# Patient Record
Sex: Female | Born: 1990 | Race: White | Hispanic: No | Marital: Single | Smoking: Never smoker
Health system: Southern US, Community
[De-identification: ages and names within clinical notes are randomized; demographics above are authoritative.]

## PROBLEM LIST (undated history)

## (undated) DIAGNOSIS — K219 Gastro-esophageal reflux disease without esophagitis: Secondary | ICD-10-CM

## (undated) DIAGNOSIS — F79 Unspecified intellectual disabilities: Secondary | ICD-10-CM

## (undated) DIAGNOSIS — M793 Panniculitis, unspecified: Secondary | ICD-10-CM

## (undated) DIAGNOSIS — Z973 Presence of spectacles and contact lenses: Secondary | ICD-10-CM

## (undated) DIAGNOSIS — E119 Type 2 diabetes mellitus without complications: Secondary | ICD-10-CM

## (undated) HISTORY — PX: ABDOMINAL HYSTERECTOMY: SHX81

---
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
## 2012-07-26 ENCOUNTER — Ambulatory Visit: Payer: Self-pay

## 2012-08-02 ENCOUNTER — Ambulatory Visit: Payer: Self-pay | Admitting: Internal Medicine

## 2012-12-25 ENCOUNTER — Ambulatory Visit: Payer: Self-pay

## 2013-05-13 ENCOUNTER — Ambulatory Visit: Payer: Self-pay | Admitting: Anesthesiology

## 2013-05-13 LAB — BASIC METABOLIC PANEL
Anion Gap: 4 — ABNORMAL LOW (ref 7–16)
BUN: 12 mg/dL (ref 7–18)
Calcium, Total: 9.5 mg/dL (ref 8.5–10.1)
Chloride: 106 mmol/L (ref 98–107)
Co2: 30 mmol/L (ref 21–32)
Creatinine: 0.67 mg/dL (ref 0.60–1.30)
Glucose: 93 mg/dL (ref 65–99)
Osmolality: 279 (ref 275–301)
POTASSIUM: 3.8 mmol/L (ref 3.5–5.1)
SODIUM: 140 mmol/L (ref 136–145)

## 2013-05-14 ENCOUNTER — Ambulatory Visit: Payer: Self-pay | Admitting: Unknown Physician Specialty

## 2013-12-02 DIAGNOSIS — Z6841 Body Mass Index (BMI) 40.0 and over, adult: Secondary | ICD-10-CM

## 2013-12-02 DIAGNOSIS — M1710 Unilateral primary osteoarthritis, unspecified knee: Secondary | ICD-10-CM | POA: Insufficient documentation

## 2014-07-26 NOTE — Op Note (Signed)
PATIENT NAME:  Whitney Brandt, Whitney Brandt MR#:  [id number] DATE OF BIRTH:  09/03/1990  DATE OF PROCEDURE:  05/14/2013  PREOPERATIVE DIAGNOSIS:  Chronic eustachian tube dysfunction and serous otitis.    POSTOPERATIVE DIAGNOSIS:  Chronic eustachian tube dysfunction and serous otitis.    OPERATION:  Bilateral myringotomy and butterfly tube placement.  SURGEON:  Linus Salmonshapman Kariann Wecker, MD  ANESTHESIA:  General mask.  OPERATIVE FINDINGS:  Bilateral serous fluid.   DESCRIPTION OF PROCEDURE:  [name] was identified in the holding area, taken to the operating room, and placed in the supine position.  After general mask anesthesia, the operating microscope was brought into the field.  Beginning with the right ear, the external canal was cleaned of cerumen. An anterior inferior myringotomy was performed.  There was serous fluid in the right middle ear space.  A butterfly tube was placed in the myringotomy.  Ciprodex drops were instilled in the external canal followed by a cotton ball.  In a similar fashion, a tube was placed in the opposite ear.  On the left, there was serous fluid.  The patient tolerated the procedure well, was awakened in the operating room, and taken to the recovery room in stable condition.  CULTURES:  None.  SPECIMENS:  None.  ESTIMATED BLOOD LOSS:  None.   ____________________________ Davina Pokehapman T. Abelino Tippin, MD ctm:dmm Brandt: 05/14/2013 09:27:22 ET T: 05/14/2013 09:45:55 ET JOB#: [id number]  cc: Davina Pokehapman T. Chanelle Hodsdon, MD, <Dictator> Davina PokeHAPMAN T Mio Schellinger MD ELECTRONICALLY SIGNED 05/28/2013 10:31

## 2015-02-09 ENCOUNTER — Emergency Department
Admission: EM | Admit: 2015-02-09 | Discharge: 2015-02-09 | Disposition: A | Discharge status: Home / Self Care | Payer: Medicaid Other | Attending: Emergency Medicine | Admitting: Emergency Medicine

## 2015-02-09 ENCOUNTER — Encounter: Payer: Self-pay | Admitting: Emergency Medicine

## 2015-02-09 ENCOUNTER — Emergency Department: Payer: Medicaid Other

## 2015-02-09 DIAGNOSIS — Z3202 Encounter for pregnancy test, result negative: Secondary | ICD-10-CM | POA: Insufficient documentation

## 2015-02-09 DIAGNOSIS — K760 Fatty (change of) liver, not elsewhere classified: Secondary | ICD-10-CM | POA: Diagnosis not present

## 2015-02-09 DIAGNOSIS — R1011 Right upper quadrant pain: Secondary | ICD-10-CM

## 2015-02-09 DIAGNOSIS — E119 Type 2 diabetes mellitus without complications: Secondary | ICD-10-CM | POA: Diagnosis not present

## 2015-02-09 HISTORY — DX: Type 2 diabetes mellitus without complications: E11.9

## 2015-02-09 LAB — CBC
HEMATOCRIT: 43.4 % (ref 35.0–47.0)
HEMOGLOBIN: 14.7 g/dL (ref 12.0–16.0)
MCH: 28.3 pg (ref 26.0–34.0)
MCHC: 34 g/dL (ref 32.0–36.0)
MCV: 83.2 fL (ref 80.0–100.0)
Platelets: 223 10*3/uL (ref 150–440)
RBC: 5.21 MIL/uL — AB (ref 3.80–5.20)
RDW: 13.4 % (ref 11.5–14.5)
WBC: 9.3 10*3/uL (ref 3.6–11.0)

## 2015-02-09 LAB — URINALYSIS COMPLETE WITH MICROSCOPIC (ARMC ONLY)
BACTERIA UA: NONE SEEN
Bilirubin Urine: NEGATIVE
Glucose, UA: NEGATIVE mg/dL
HGB URINE DIPSTICK: NEGATIVE
Ketones, ur: NEGATIVE mg/dL
Nitrite: NEGATIVE
PH: 5 (ref 5.0–8.0)
Protein, ur: NEGATIVE mg/dL
Specific Gravity, Urine: 1.014 (ref 1.005–1.030)

## 2015-02-09 LAB — COMPREHENSIVE METABOLIC PANEL
ALBUMIN: 3.6 g/dL (ref 3.5–5.0)
ALT: 53 U/L (ref 14–54)
AST: 37 U/L (ref 15–41)
Alkaline Phosphatase: 100 U/L (ref 38–126)
Anion gap: 9 (ref 5–15)
BILIRUBIN TOTAL: 0.3 mg/dL (ref 0.3–1.2)
BUN: 10 mg/dL (ref 6–20)
CHLORIDE: 104 mmol/L (ref 101–111)
CO2: 30 mmol/L (ref 22–32)
Calcium: 9.5 mg/dL (ref 8.9–10.3)
Creatinine, Ser: 0.66 mg/dL (ref 0.44–1.00)
GFR calc Af Amer: >60 mL/min (ref >60)
GFR calc non Af Amer: >60 mL/min (ref >60)
GLUCOSE: 114 mg/dL — AB (ref 65–99)
POTASSIUM: 4.1 mmol/L (ref 3.5–5.1)
SODIUM: 143 mmol/L (ref 135–145)
Total Protein: 7.6 g/dL (ref 6.5–8.1)

## 2015-02-09 LAB — POCT PREGNANCY, URINE: Preg Test, Ur: NEGATIVE

## 2015-02-09 LAB — LIPASE, BLOOD: Lipase: 21 U/L (ref 11–51)

## 2015-02-09 MED ORDER — FENTANYL CITRATE (PF) 100 MCG/2ML IJ SOLN
50.0000 ug | Freq: Once | INTRAMUSCULAR | Status: AC
  Administered 2015-02-09: 50 ug via INTRAMUSCULAR
  Filled 2015-02-09: qty 2

## 2015-02-09 NOTE — ED Provider Notes (Signed)
[facility] Emergency Department Provider Note  ____________________________________________  Time seen: Approximately 8:52 PM  I have reviewed the triage vital signs and the nursing notes.   HISTORY  Chief Complaint Abdominal Pain    HPI Whitney Brandt is a 24 y.o. female status post hysterectomy presenting with 4 days of right upper quadrant pain. Patient and her sister-in-law reports that over the last several days she has had right upper quadrant pain "all the time" that is "sharp."  It is worse with eating. She does not have any associated fever, chills, nausea or vomiting. No diarrhea or constipation. No abdominal distention.   Past Medical History  Diagnosis Date  . Diabetes mellitus without complication (HCC)     There are no active problems to display for this patient.   Past Surgical History  Procedure Laterality Date  . Abdominal hysterectomy      No current outpatient prescriptions on file.  Allergies Review of patient's allergies indicates no known allergies.  No family history on file.  Social History Social History  Substance Use Topics  . Smoking status: Never Smoker   . Smokeless tobacco: None  . Alcohol Use: None    Review of Systems Constitutional: No fever/chills. No lightheadedness or syncope. Eyes: No visual changes. ENT: No sore throat. Cardiovascular: Denies chest pain, palpitations. Respiratory: Denies shortness of breath.  No cough. Gastrointestinal: Positive right upper quadrant abdominal pain.  No nausea, no vomiting.  No diarrhea.  No constipation. Genitourinary: Negative for dysuria. Musculoskeletal: Negative for back pain. Skin: Negative for rash. Neurological: Negative for headaches, focal weakness or numbness.  10-point ROS otherwise negative.  ____________________________________________   PHYSICAL EXAM:  VITAL SIGNS: ED Triage Vitals  Enc Vitals Group     BP 02/09/15 1854 154/88 mmHg      Pulse Rate 02/09/15 1854 63     Resp 02/09/15 1854 20     Temp 02/09/15 1854 98.2 F (36.8 C)     Temp Source 02/09/15 1854 Oral     SpO2 02/09/15 1859 97 %     Weight 02/09/15 1854 302 lb (136.986 kg)     Height 02/09/15 1854 5\' 7"  (1.702 m)     Head Cir --      Peak Flow --      Pain Score --      Pain Loc --      Pain Edu? --      Excl. in GC? --     Constitutional: Alert and oriented. Well appearing and in no acute distress. Answer question appropriately. Able to move about in the stretcher and the room without significant pain. Eyes: Conjunctivae are normal.  EOMI. Head: Atraumatic. Nose: No congestion/rhinnorhea. Mouth/Throat: Mucous membranes are moist.  Neck: No stridor.  Supple.   Cardiovascular: Normal rate, regular rhythm. No murmurs, rubs or gallops.  Respiratory: Normal respiratory effort.  No retractions. Lungs CTAB.  No wheezes, rales or ronchi. Gastrointestinal: Morbidly obese. Abdomen is soft, nondistended. She has minimal pain in the right upper quadrant and a negative Murphy sign. No peritoneal signs, no guarding or rebound. Musculoskeletal: No LE edema.  Neurologic:  Normal speech and language. No gross focal neurologic deficits are appreciated.  Skin:  Skin is warm, dry and intact. No rash noted. Psychiatric: Mood and affect are normal. Speech and behavior are normal.  Normal judgement.  ____________________________________________   LABS (all labs ordered are listed, but only abnormal results are displayed)  Labs Reviewed  CBC - Abnormal; Notable  for the following:    RBC 5.21 (*)    All other components within normal limits  COMPREHENSIVE METABOLIC PANEL - Abnormal; Notable for the following:    Glucose, Bld 114 (*)    All other components within normal limits  URINALYSIS COMPLETEWITH MICROSCOPIC ([facility] ONLY) - Abnormal; Notable for the following:    Color, Urine YELLOW (*)    APPearance CLEAR (*)    Leukocytes, UA 2+ (*)    Squamous Epithelial  / LPF 0-5 (*)    All other components within normal limits  LIPASE, BLOOD  POC URINE PREG, ED  POCT PREGNANCY, URINE   ____________________________________________  EKG  Not indicated ____________________________________________  RADIOLOGY  US Abdomen Limited Ruq  02/09/2015  CLINICAL DATA:  Right upper quadrant abdominal pain EXAM: US ABDOMEN LIMITED - RIGHT UPPER QUADRANT COMPARISON:  08/02/2012 FINDINGS: Sensitivity diminished by patient body habitus. Gallbladder: No gallstones or wall thickening visualized. No sonographic Murphy sign noted. Common bile duct: Diameter: 3 mm Liver: Echogenic liver with poor acoustic transmission and portal triad visualization. Appearance is similar to prior. No focal lesion is seen. Antegrade flow in the imaged portal venous system. IMPRESSION: 1. Negative gallbladder. 2. Hepatic steatosis. Electronically Signed   By: Marnee Spring M.D.   On: 02/09/2015 21:36    ____________________________________________   PROCEDURES  Procedure(s) performed: None  Critical Care performed: No ____________________________________________   INITIAL IMPRESSION / ASSESSMENT AND PLAN / ED COURSE  Pertinent labs & imaging results that were available during my care of the patient were reviewed by me and considered in my medical decision making (see chart for details).  23 y.o. female with several days of right upper quadrant pain without any other associated symptoms. On my exam, she is well-appearing and nontoxic. Her vital signs are stable. Her labs are reassuring. She does not have any signs or symptoms that would be consistent with an acute cardiac disease or pneumonia. I will get an ultrasound to evaluate her gallbladder.  Patient ultrasound did not show any gallbladder disease. The patient remained stable and will be discharged with close PMD follow-up. I did discuss return precautions and follow-up instructions with the patient and her sister-in-law who  brought her here.  ____________________________________________  FINAL CLINICAL IMPRESSION(S) / ED DIAGNOSES  Final diagnoses:  Right upper quadrant pain  Hepatic steatosis      NEW MEDICATIONS STARTED DURING THIS VISIT:  There are no discharge medications for this patient.    Rockne Menghini, MD 02/09/15 2221

## 2015-02-09 NOTE — ED Notes (Signed)
Pt states that she has had pain in R side for about a week.  States that pain is constant.  States she noticed a lump on R side/beneath R breast area.  Bump is present at this time.  Pt has had no nausea/vomiting/changes in bowel movements.  Pt in NAD at this time.  Mother present at bedside.

## 2015-02-09 NOTE — ED Notes (Signed)
Right upper quadrant pain x 1 week.  Seen by PCP who felt a "Knot" and told that patient needed RUQ US.  No appointments available until December.  Mom reports patient sits and cries at home from the pain.

## 2015-02-09 NOTE — Discharge Instructions (Signed)
You may take Tylenol for your pain. Please make an appointment with your primary care physician for follow-up about your abdominal pain. Next  Please return to the emergency department if you develop severe pain, fever, inability to keep down fluids, or any other symptoms concerning to you.  Abdominal Pain, Adult Many things can cause abdominal pain. Usually, abdominal pain is not caused by a disease and will improve without treatment. It can often be observed and treated at home. Your health care provider will do a physical exam and possibly order blood tests and X-rays to help determine the seriousness of your pain. However, in many cases, more time must pass before a clear cause of the pain can be found. Before that point, your health care provider may not know if you need more testing or further treatment. HOME CARE INSTRUCTIONS Monitor your abdominal pain for any changes. The following actions may help to alleviate any discomfort you are experiencing:  Only take over-the-counter or prescription medicines as directed by your health care provider.  Do not take laxatives unless directed to do so by your health care provider.  Try a clear liquid diet (broth, tea, or water) as directed by your health care provider. Slowly move to a bland diet as tolerated. SEEK MEDICAL CARE IF:  You have unexplained abdominal pain.  You have abdominal pain associated with nausea or diarrhea.  You have pain when you urinate or have a bowel movement.  You experience abdominal pain that wakes you in the night.  You have abdominal pain that is worsened or improved by eating food.  You have abdominal pain that is worsened with eating fatty foods.  You have a fever. SEEK IMMEDIATE MEDICAL CARE IF:  Your pain does not go away within 2 hours.  You keep throwing up (vomiting).  Your pain is felt only in portions of the abdomen, such as the right side or the left lower portion of the abdomen.  You pass  bloody or black tarry stools. MAKE SURE YOU:  Understand these instructions.  Will watch your condition.  Will get help right away if you are not doing well or get worse.   This information is not intended to replace advice given to you by your health care provider. Make sure you discuss any questions you have with your health care provider.   Document Released: 12/29/2004 Document Revised: 12/10/2014 Document Reviewed: 11/28/2012 Elsevier Interactive Patient Education [employer]2016 Elsevier Inc.

## 2015-02-09 NOTE — ED Notes (Signed)
Patient discharged to home per MD order. Patient in stable condition, and deemed medically cleared by ED provider for discharge. Discharge instructions reviewed with patient/family using "Teach Back"; verbalized understanding of medication education and administration, and information about follow-up care. Denies further concerns. ° °

## 2015-08-12 ENCOUNTER — Other Ambulatory Visit: Payer: Self-pay | Admitting: Nurse Practitioner

## 2015-08-12 DIAGNOSIS — M25572 Pain in left ankle and joints of left foot: Secondary | ICD-10-CM

## 2015-08-12 DIAGNOSIS — M25562 Pain in left knee: Secondary | ICD-10-CM

## 2015-08-12 DIAGNOSIS — Z9181 History of falling: Secondary | ICD-10-CM

## 2017-03-18 IMAGING — US US ABDOMEN LIMITED
1 series · 14 of 25 positions shown · non-contrast
Comparison: 08/02/2012

CLINICAL DATA: Right upper quadrant abdominal pain

EXAM:
US ABDOMEN LIMITED - RIGHT UPPER QUADRANT

[Series 1: us abdomen limited · 0.23mm/px · 14 of 39 slices shown]
[im 1/39]
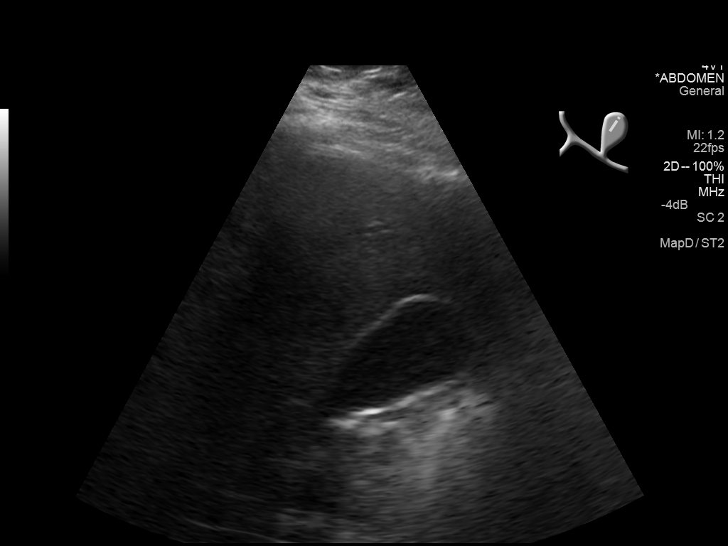
[im 4/39]
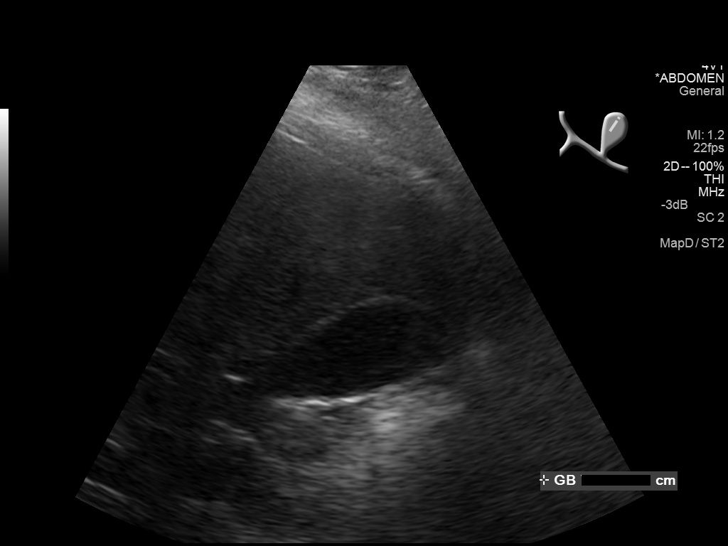
[im 7/39]
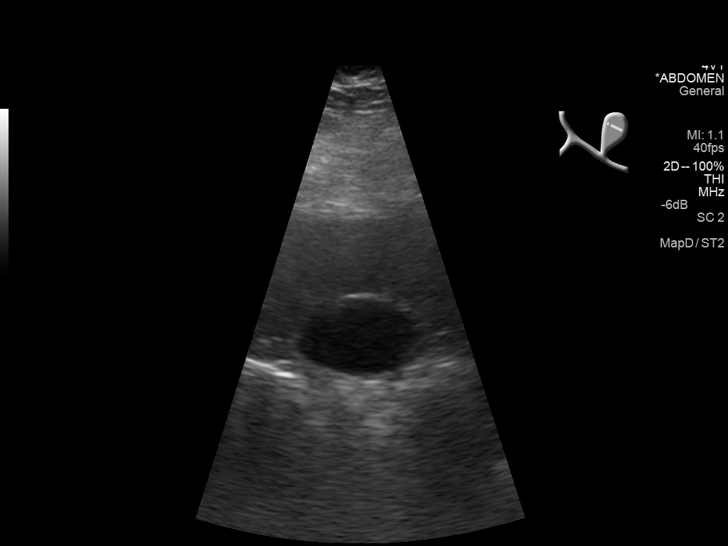
[im 10/39]
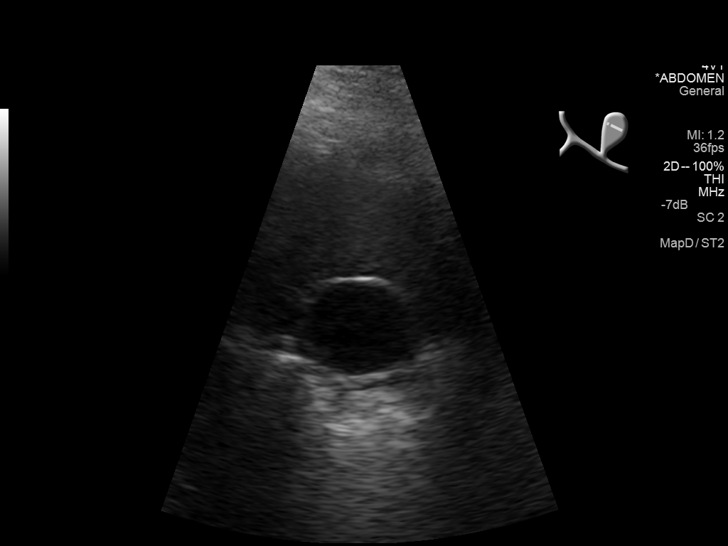
[im 13/39]
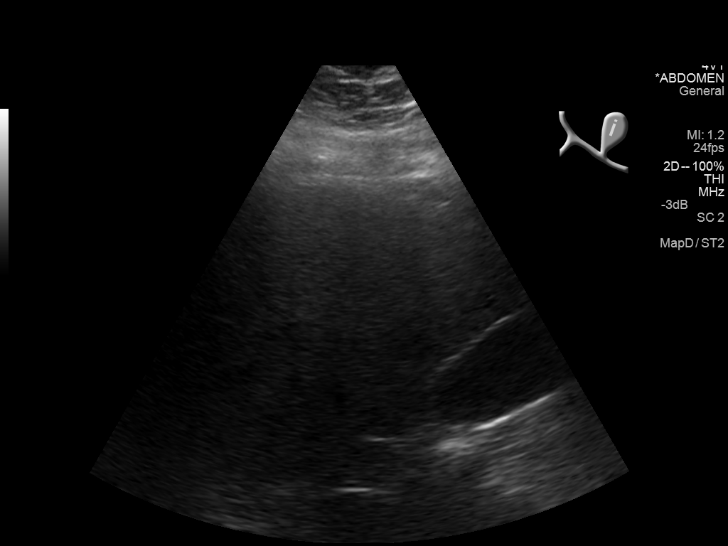
[im 15/39]
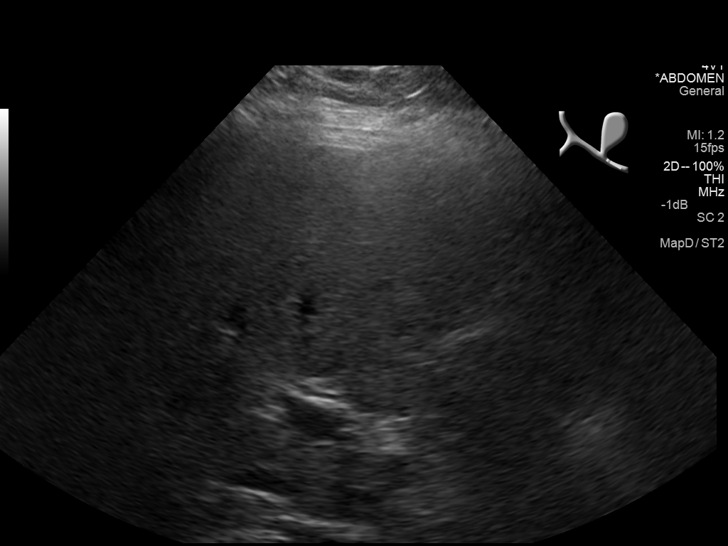
[im 18/39]
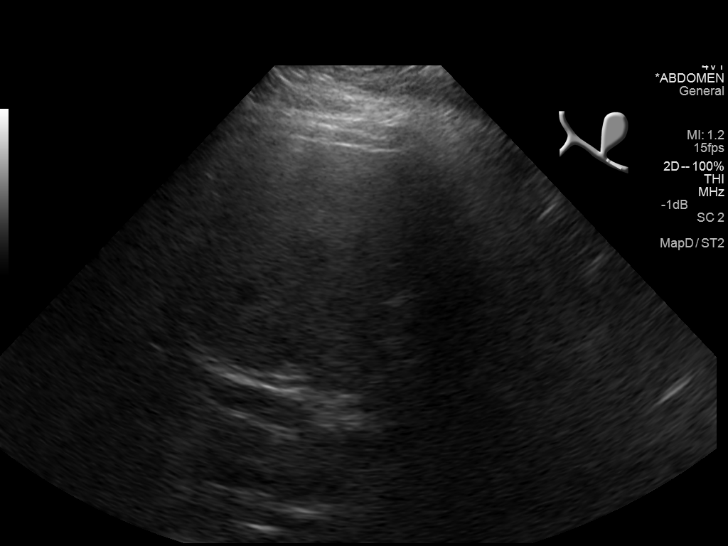
[im 21/39]
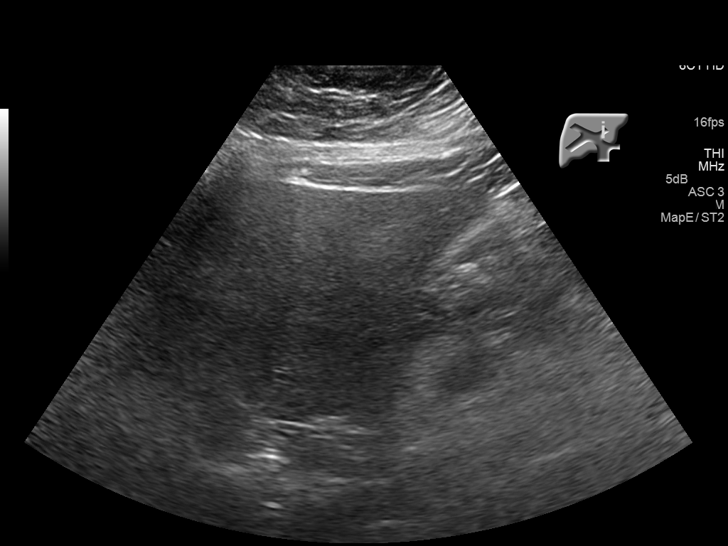
[im 24/39]
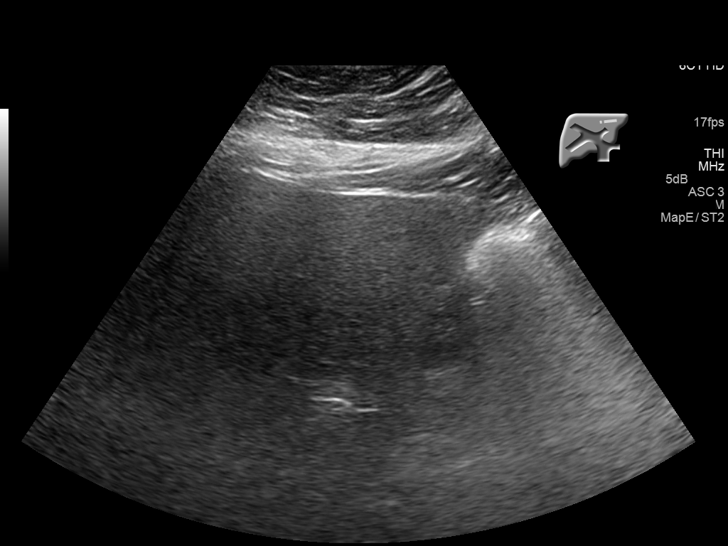
[im 26/39]
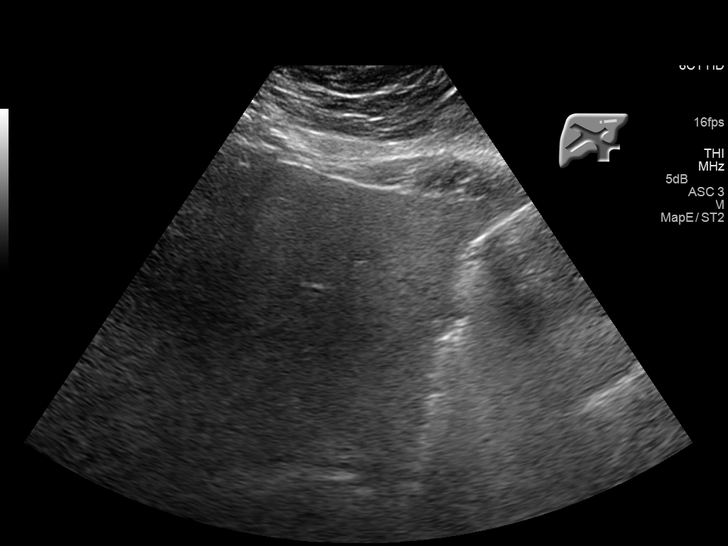
[im 29/39]
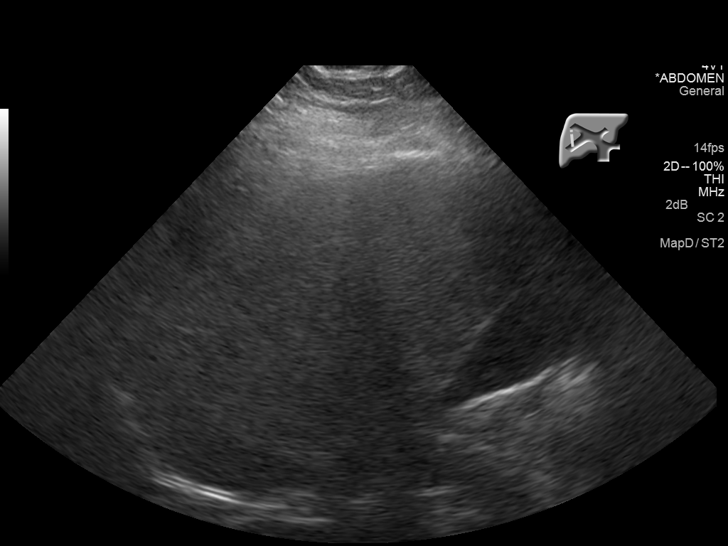
[im 32/39]
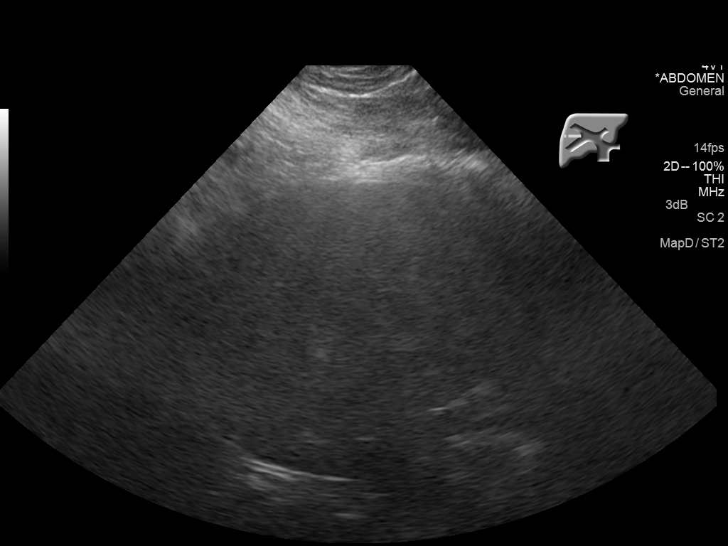
[im 35/39]
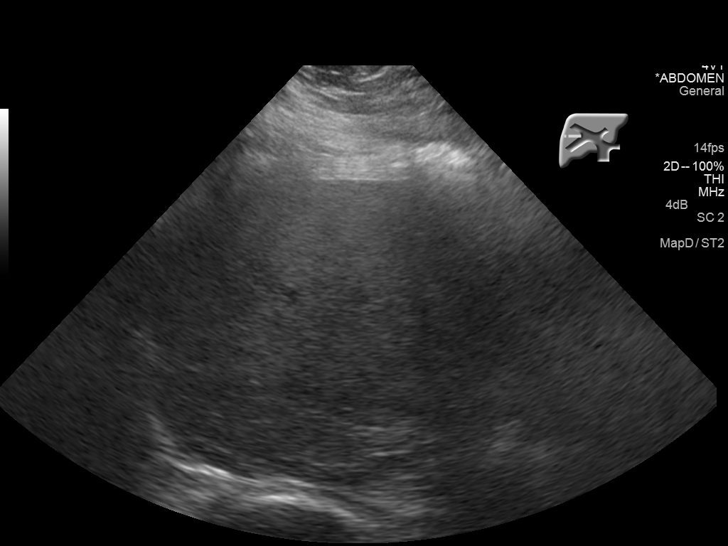
[im 39/39]
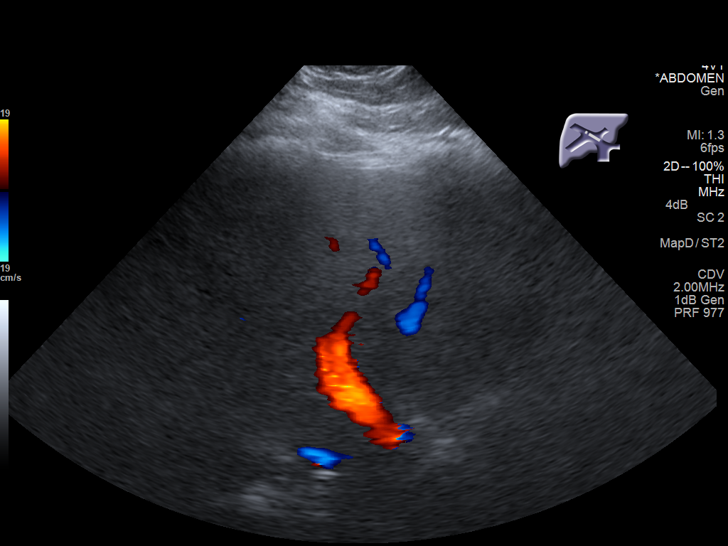

[14 of 25 positions shown; findings below may reference images not displayed]

FINDINGS: Sensitivity diminished by patient body habitus.

Gallbladder:

No gallstones or wall thickening visualized. No sonographic Murphy
sign noted.

Common bile duct:

Diameter: 3 mm

Liver:

Echogenic liver with poor acoustic transmission and portal [REDACTED]
visualization. Appearance is similar to prior. No focal lesion is
seen. Antegrade flow in the imaged portal venous system.
IMPRESSION: 1. Negative gallbladder.
2. Hepatic steatosis.

## 2017-03-22 ENCOUNTER — Encounter: Payer: Self-pay | Admitting: Nurse Practitioner

## 2017-03-22 ENCOUNTER — Ambulatory Visit: Payer: Medicaid Other | Admitting: Nurse Practitioner

## 2017-03-22 DIAGNOSIS — E010 Iodine-deficiency related diffuse (endemic) goiter: Secondary | ICD-10-CM

## 2017-03-22 DIAGNOSIS — M1710 Unilateral primary osteoarthritis, unspecified knee: Secondary | ICD-10-CM | POA: Diagnosis not present

## 2017-03-22 DIAGNOSIS — L709 Acne, unspecified: Secondary | ICD-10-CM | POA: Insufficient documentation

## 2017-03-22 DIAGNOSIS — J309 Allergic rhinitis, unspecified: Secondary | ICD-10-CM | POA: Diagnosis not present

## 2017-03-22 DIAGNOSIS — R7301 Impaired fasting glucose: Secondary | ICD-10-CM

## 2017-03-22 DIAGNOSIS — Z23 Encounter for immunization: Secondary | ICD-10-CM | POA: Diagnosis not present

## 2017-03-22 DIAGNOSIS — M545 Low back pain, unspecified: Secondary | ICD-10-CM | POA: Insufficient documentation

## 2017-03-22 DIAGNOSIS — J029 Acute pharyngitis, unspecified: Secondary | ICD-10-CM | POA: Insufficient documentation

## 2017-03-22 MED ORDER — FLUTICASONE PROPIONATE 50 MCG/ACT NA SUSP: 2.0000 | Freq: Every day | NASAL | 6 refills | Status: DC

## 2017-03-22 MED ORDER — ACETAMINOPHEN-CODEINE 300-30 MG PO TABS: 1.0000 | ORAL_TABLET | Freq: Every evening | ORAL | 1 refills | Status: DC | PRN

## 2017-03-22 MED ORDER — PHENTERMINE HCL 37.5 MG PO TABS: 37.5000 mg | ORAL_TABLET | Freq: Every day | ORAL | 1 refills | Status: DC

## 2017-03-22 NOTE — Progress Notes (Signed)
Subjective:     Patient ID: Whitney Brandt, female   DOB: 07/12/1990, 26 y.o.   MRN: [id number]  Current Meds  Medication Sig  . chlorhexidine (HIBICLENS) 4 % external liquid   . nystatin cream (MYCOSTATIN)    The patient c/o difficulty swallowing. Feels like food and medications are getting stuck in her throat. This is intermittent.  [name] also here as follolw up for weight management. Takes phentermine to help suppress the appetite. She has lost 16 pounds since she was last seen. She has lost nearly 100 pounds since starting medically managed program for weight loss.      Review of Systems  Constitutional: Negative for activity change and appetite change.       Weight loss of 16 pounds since most recent visit .  HENT: Positive for ear pain and trouble swallowing.        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 States they were looking good but wants to see her back I n6 months.   Eyes: Negative.   Respiratory: Negative.   Cardiovascular: Negative for palpitations.  Gastrointestinal: Negative.   Endocrine:       Difficulty swallowing.   Genitourinary: Negative.   Musculoskeletal:       Intermittent knee pain.  Neurological: Negative.   Hematological: Negative.   Psychiatric/Behavioral: Negative.        Today's Vitals   03/22/17 1419  BP: 139/82  Pulse: 89  Resp: 16  SpO2: 99%  Weight: 232 lb 9.6 oz (105.5 kg)  Height: 5\' 6"  (1.676 m)   Objective:   Physical Exam  Constitutional: [name] oriented to person, place, and time. She appears well-developed and well-nourished.  HENT:  Head: Normocephalic and atraumatic.  Neck: Normal range of motion. Neck supple. Thyromegaly present.  Cardiovascular: Normal rate and regular rhythm.  Pulmonary/Chest: Effort normal and breath sounds normal.  Abdominal: Soft. There is no tenderness.  Musculoskeletal:       Right knee: She exhibits decreased range of motion and swelling. Tenderness found.    Left knee: She exhibits decreased range of motion and swelling. Tenderness found.  Neurological: [name] alert and oriented to person, place, and time.  Skin: Skin is warm and dry.  Psychiatric: She has a normal mood and affect.       Assessment:     Morbid obesity (HCC) - Plan: Comprehensive Metabolic Panel (CMET), Lipid Profile, phentermine (ADIPEX-P) 37.5 MG tablet  Thyromegaly - Plan: CBC w/Diff/Platelet, TSH, T4, free, US Soft Tissue Head/Neck  Arthrosis of knee - Plan: Acetaminophen-Codeine (TYLENOL/CODEINE #3) 300-30 MG tablet  Flu vaccine need - Plan: Flu Vaccine QUAD 36+ mos IM, CANCELED: Flu Vaccine QUAD 36+ mos IM  Impaired fasting glucose - Plan: HgB A1c  Allergic rhinitis, unspecified seasonality, unspecified trigger - Plan: fluticasone (FLONASE) 50 MCG/ACT nasal spray     Plan:     1. Obesity - continues to do very well. Additional 16 pounds lost since most recent visit. Continue phentermine 37.5mg  daily. Limit calorie intake to 1500 calories per day and participate in regular exercise 2. Thyromegaly - labs with TSH and Free t4. U/s thyroid prdered 3. Arthrosis of knee - renew tylenol/APAP 30/300mg  which may be taken at night if needed for oderate pain.  4. Flu vaccine administerd 5. Check HgbA1c  6. Allergic rhinitis - continue allergy meds and flonase nasal spray daily.   Follow up 2 months and sooner if needed

## 2017-03-22 NOTE — Patient Instructions (Addendum)
What You Need to Know About Osteoarthritis Osteoarthritis is a type of arthritis that affects tissue that covers the ends of bones in joints (cartilage). Cartilage acts as a cushion between the bones and helps them move smoothly. Osteoarthritis results when cartilage in the joints gets worn down. Osteoarthritis is sometimes called "wear and tear" arthritis. Osteoarthritis can affect any joint and can make movement painful. Hips, knees, fingers, and toes are some of the joints that are most often affected by osteoarthritis. You may be more likely to develop osteoarthritis if:  You are middle-aged or older.  You are obese.  You have injured a joint or had surgery on a joint.  You have a family history of osteoarthritis.  How can osteoarthritis affect me? Osteoarthritis can cause:  Pain and swelling in your joint.  Difficulty moving your joint.  A grating or scraping feeling inside the joint when you move it.  Popping or creaking sounds when you move.  This condition can make it harder to do things that you need or want to do each day. Osteoarthritis in a major joint, such as your knee or hip, can make it painful to walk or exercise. If you have osteoarthritis in your hands, you might not be able to grip items, twist your hand, or control small movements of your hands and fingers (fine motor skills). Over time, osteoarthritis could cause you to be less physically active. Being less active increases your risk for other long-term (chronic) health problems, such as diabetes and heart disease. What lifestyle changes can be made? You can lessen the impact that osteoarthritis has on your daily life by:  Switching to low-impact activities that do not put repeated pressure on your joints. For example, if you usually run or jog for exercise, try swimming or riding a bike instead.  Staying active. Build up to at least 150 minutes of physical activity each week to keep your joints healthy and keep  your body strong.  Losing weight. If you are overweight or obese, losing weight can take pressure off of your joints. If you need help with weight loss, talk with your health care provider or a diet and nutrition specialist (dietitian).  What other changes can be made? You can also lessen the effect of osteoarthritis by:  Using assistive devices. Sometimes a brace, wrap, splint, specialized glove, or cane can help. Talk with your health care provider or physical therapist about when and how to use these.  Working with a physical therapist who can help you find ways to do your daily activities without harming your joints. A physical therapist can also teach you exercises and stretches to strengthen the muscles that support your joints.  Treating pain and inflammation. Take over-the-counter and prescription medicines for pain and inflammation only as told by your health care provider. If directed, you may put ice on the affected joint: ? If you have a removable assistive device, remove it as told by your health care provider. ? Put ice in a plastic bag. ? Place a towel between your skin and the bag. ? Leave the ice on for 20 minutes, 2-3 times a day.  If other measures do not work, you may need joint surgery, such as joint replacement. What can happen if changes are not made? Osteoarthritis is a condition that gets worse over time (a progressive condition). If you do not take steps to strengthen your body and to slow down the progress of the disease, your condition may get worse more   quickly. Your joints may stiffen and become swollen, which will make them painful and hard to move. Where to find more information: You can learn more about osteoarthritis from:  The Arthritis Foundation: www.arthritis.org/about-arthritis/types/osteoarthritis  National Institute of Arthritis and Musculoskeletal and Skin Diseases: www.niams.nih.gov/health_info/osteoarthritis/osteoarthritis_ff.asp  Contact a  health care provider if:  You cannot do your normal activities comfortably.  Your joint does not function at all.  Your pain is interfering with your sleep.  You are gaining weight.  Your joint appears to be changing in shape, instead of just being swollen and sore. Summary  Osteoarthritis is a painful joint disease that gets worse over time.  This condition can lead to other long-term (chronic) health problems.  There are changes that you can make to slow down the progression of the disease. This information is not intended to replace advice given to you by your health care provider. Make sure you discuss any questions you have with your health care provider. Document Released: 11/10/2015 Document Revised: 11/12/2015 Document Reviewed: 11/10/2015 Elsevier Interactive Patient Education  2018 Elsevier Inc.  

## 2017-03-24 LAB — COMPREHENSIVE METABOLIC PANEL
ALT: 36 IU/L — AB (ref 0–32)
AST: 17 IU/L (ref 0–40)
Albumin/Globulin Ratio: 2 (ref 1.2–2.2)
Albumin: 4.5 g/dL (ref 3.5–5.5)
Alkaline Phosphatase: 91 IU/L (ref 39–117)
BUN/Creatinine Ratio: 15 (ref 9–23)
BUN: 10 mg/dL (ref 6–20)
Bilirubin Total: 0.5 mg/dL (ref 0.0–1.2)
CALCIUM: 9.7 mg/dL (ref 8.7–10.2)
CO2: 27 mmol/L (ref 20–29)
CREATININE: 0.65 mg/dL (ref 0.57–1.00)
Chloride: 104 mmol/L (ref 96–106)
GFR, EST AFRICAN AMERICAN: 141 mL/min/{1.73_m2} (ref >59)
GFR, EST NON AFRICAN AMERICAN: 122 mL/min/{1.73_m2} (ref >59)
Globulin, Total: 2.3 g/dL (ref 1.5–4.5)
Glucose: 68 mg/dL (ref 65–99)
Potassium: 3.9 mmol/L (ref 3.5–5.2)
Sodium: 144 mmol/L (ref 134–144)
TOTAL PROTEIN: 6.8 g/dL (ref 6.0–8.5)

## 2017-03-24 LAB — HEMOGLOBIN A1C
ESTIMATED AVERAGE GLUCOSE: 103 mg/dL
HEMOGLOBIN A1C: 5.2 % (ref 4.8–5.6)

## 2017-03-24 LAB — CBC WITH DIFFERENTIAL/PLATELET
BASOS: 0 %
Basophils Absolute: 0 10*3/uL (ref 0.0–0.2)
EOS (ABSOLUTE): 0.2 10*3/uL (ref 0.0–0.4)
EOS: 3 %
HEMATOCRIT: 41.5 % (ref 34.0–46.6)
Hemoglobin: 14 g/dL (ref 11.1–15.9)
IMMATURE GRANS (ABS): 0 10*3/uL (ref 0.0–0.1)
IMMATURE GRANULOCYTES: 0 %
LYMPHS: 33 %
Lymphocytes Absolute: 2.1 10*3/uL (ref 0.7–3.1)
MCH: 28.3 pg (ref 26.6–33.0)
MCHC: 33.7 g/dL (ref 31.5–35.7)
MCV: 84 fL (ref 79–97)
Monocytes Absolute: 0.3 10*3/uL (ref 0.1–0.9)
Monocytes: 5 %
NEUTROS PCT: 59 %
Neutrophils Absolute: 3.7 10*3/uL (ref 1.4–7.0)
Platelets: 216 10*3/uL (ref 150–379)
RBC: 4.94 x10E6/uL (ref 3.77–5.28)
RDW: 13.4 % (ref 12.3–15.4)
WBC: 6.3 10*3/uL (ref 3.4–10.8)

## 2017-03-24 LAB — TSH: TSH: 0.38 u[IU]/mL — ABNORMAL LOW (ref 0.450–4.500)

## 2017-03-24 LAB — LIPID PANEL
CHOLESTEROL TOTAL: 129 mg/dL (ref 100–199)
Chol/HDL Ratio: 3.8 ratio (ref 0.0–4.4)
HDL: 34 mg/dL — AB (ref >39)
LDL Calculated: 74 mg/dL (ref 0–99)
TRIGLYCERIDES: 106 mg/dL (ref 0–149)
VLDL Cholesterol Cal: 21 mg/dL (ref 5–40)

## 2017-03-24 LAB — T4, FREE: Free T4: 1.27 ng/dL (ref 0.82–1.77)

## 2017-03-24 NOTE — Progress Notes (Signed)
Hey. Can you let the patient know that her labs looked great. Thanks

## 2017-04-17 ENCOUNTER — Other Ambulatory Visit: Payer: Self-pay | Admitting: Nurse Practitioner

## 2017-04-26 ENCOUNTER — Other Ambulatory Visit: Payer: Self-pay

## 2017-04-26 MED ORDER — IBUPROFEN 800 MG PO TABS: ORAL_TABLET | ORAL | 3 refills | Status: DC

## 2017-05-01 ENCOUNTER — Ambulatory Visit: Payer: Medicaid Other

## 2017-05-01 DIAGNOSIS — E049 Nontoxic goiter, unspecified: Secondary | ICD-10-CM

## 2017-05-01 DIAGNOSIS — E010 Iodine-deficiency related diffuse (endemic) goiter: Secondary | ICD-10-CM

## 2017-05-11 ENCOUNTER — Telehealth: Payer: Self-pay

## 2017-05-11 NOTE — Telephone Encounter (Signed)
Pt advised for ultrasound and send tat reminder for referral/np

## 2017-05-23 ENCOUNTER — Ambulatory Visit: Payer: Self-pay | Admitting: Nurse Practitioner

## 2017-05-29 ENCOUNTER — Other Ambulatory Visit: Payer: Self-pay | Admitting: Nurse Practitioner

## 2017-05-29 MED ORDER — PHENTERMINE HCL 37.5 MG PO TABS: 37.5000 mg | ORAL_TABLET | Freq: Every day | ORAL | 1 refills | Status: DC

## 2017-05-29 NOTE — Progress Notes (Signed)
Refilled patient's phentermine and sent to [facility]

## 2017-05-30 ENCOUNTER — Telehealth: Payer: Self-pay

## 2017-05-30 ENCOUNTER — Other Ambulatory Visit: Payer: Self-pay | Admitting: Unknown Physician Specialty

## 2017-05-30 DIAGNOSIS — E041 Nontoxic single thyroid nodule: Secondary | ICD-10-CM

## 2017-05-30 NOTE — Telephone Encounter (Signed)
-----   Message from Carlean JewsHeather E Boscia, NP sent at 05/29/2017  5:51 PM EST ----- Refilled patient's phentermine and sent to [facility]   ----- Message ----- From: Golda AcrePatel, Arjay Jaskiewicz Sent: 05/29/2017   5:01 PM To: Carlean JewsHeather E Boscia, NP  Pt left the message that you told her that she can call for her wt loss med

## 2017-05-30 NOTE — Telephone Encounter (Signed)
Pt advised send refill to [facility]

## 2017-05-30 NOTE — Telephone Encounter (Signed)
-----   Message from Heather E Boscia, NP sent at 05/29/2017  5:51 PM EST ----- Refilled patient's phentermine and sent to [facility]   ----- Message ----- From: Dayden Viverette Sent: 05/29/2017   5:01 PM To: Heather E Boscia, NP  Pt left the message that you told her that she can call for her wt loss med   

## 2017-05-31 ENCOUNTER — Other Ambulatory Visit: Payer: Self-pay | Admitting: Unknown Physician Specialty

## 2017-05-31 DIAGNOSIS — R1312 Dysphagia, oropharyngeal phase: Secondary | ICD-10-CM

## 2017-06-19 ENCOUNTER — Encounter: Payer: Self-pay | Admitting: Nurse Practitioner

## 2017-06-19 ENCOUNTER — Ambulatory Visit: Payer: Medicaid Other | Admitting: Nurse Practitioner

## 2017-06-19 DIAGNOSIS — M1710 Unilateral primary osteoarthritis, unspecified knee: Secondary | ICD-10-CM

## 2017-06-19 MED ORDER — ACETAMINOPHEN-CODEINE 300-30 MG PO TABS: 1.0000 | ORAL_TABLET | Freq: Every evening | ORAL | 1 refills | Status: DC | PRN

## 2017-06-19 MED ORDER — PHENTERMINE HCL 37.5 MG PO TABS: 37.5000 mg | ORAL_TABLET | Freq: Every day | ORAL | 1 refills | Status: DC

## 2017-06-19 NOTE — Progress Notes (Signed)
[facility] [address] [place], [place] [address]  Internal MEDICINE  Office Visit Note  Patient Name: Whitney Brandt  [id number]  [id number]  Date of Service: 07/09/2017  Chief Complaint  Patient presents with  . Obesity    weight loss management    The patient is here for routine follow up exam. [name] currently taking phentermine daily. [name] carefully portioning her food and exercising often. Going back to 07/2015, she has lost 101 pounds. [name] doing well without negative concerns or complaints.     [name] for routine follow up.    Current Medication: Outpatient Encounter Medications as of 06/19/2017  Medication Sig  . Acetaminophen-Codeine (TYLENOL/CODEINE #3) 300-30 MG tablet Take 1-2 tablets by mouth at bedtime as needed for pain.  . chlorhexidine (HIBICLENS) 4 % external liquid   . fluticasone (FLONASE) 50 MCG/ACT nasal spray Place 2 sprays into both nostrils daily.  Marland Kitchen ibuprofen (ADVIL,MOTRIN) 800 MG tablet Take 1 tab po three times a day AS NEEDED FOR PAIN  . nystatin cream (MYCOSTATIN)   . [DISCONTINUED] Acetaminophen-Codeine (TYLENOL/CODEINE #3) 300-30 MG tablet Take 1-2 tablets by mouth at bedtime as needed for pain.  . [DISCONTINUED] phentermine (ADIPEX-P) 37.5 MG tablet Take 1 tablet (37.5 mg total) by mouth daily before breakfast.  . phentermine (ADIPEX-P) 37.5 MG tablet Take 1 tablet (37.5 mg total) by mouth daily before breakfast.   No facility-administered encounter medications on file as of 06/19/2017.     Surgical History: Past Surgical History:  Procedure Laterality Date  . ABDOMINAL HYSTERECTOMY      Medical History: Past Medical History:  Diagnosis Date  . Diabetes mellitus without complication (HCC)     Family History: Family History  Problem Relation Age of Onset  . Diabetes Mother   . Hypertension Mother   . Thyroid disease Mother     Social History   Socioeconomic History  . Marital status: Single    Spouse  name: Not on file  . Number of children: Not on file  . Years of education: Not on file  . Highest education level: Not on file  Occupational History  . Not on file  Social Needs  . Financial resource strain: Not on file  . Food insecurity:    Worry: Not on file    Inability: Not on file  . Transportation needs:    Medical: Not on file    Non-medical: Not on file  Tobacco Use  . Smoking status: Never Smoker  . Smokeless tobacco: Never Used  Substance and Sexual Activity  . Alcohol use: No    Frequency: Never  . Drug use: No  . Sexual activity: Never  Lifestyle  . Physical activity:    Days per week: Not on file    Minutes per session: Not on file  . Stress: Not on file  Relationships  . Social connections:    Talks on phone: Not on file    Gets together: Not on file    Attends religious service: Not on file    Active member of club or organization: Not on file    Attends meetings of clubs or organizations: Not on file    Relationship status: Not on file  . Intimate partner violence:    Fear of current or ex partner: Not on file    Emotionally abused: Not on file    Physically abused: Not on file    Forced sexual activity: Not on file  Other Topics Concern  . Not on file  Social History Narrative  . Not on file      Review of Systems  Constitutional: Negative for activity change and appetite change.       Weight loss 10 pounds since most recent visit   HENT: Positive for ear pain and trouble swallowing.        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 States they were looking good but wants to see her back I n6 months.   Eyes: Negative.   Respiratory: Negative.   Cardiovascular: Negative for palpitations.  Gastrointestinal: Negative.   Endocrine:       Difficulty swallowing.   Genitourinary: Negative.   Musculoskeletal:       Intermittent knee pain.  Neurological: Negative.   Hematological: Negative.   Psychiatric/Behavioral:  Negative.     Today's Vitals   06/19/17 1456  BP: 116/83  Pulse: 88  Resp: 16  SpO2: 96%  Weight: 222 lb (100.7 kg)  Height: 5\' 6"  (1.676 m)    Physical Exam  Constitutional: [name] oriented to person, place, and time. She appears well-developed and well-nourished.  Morbidly obese  HENT:  Head: Normocephalic and atraumatic.  Neck: Normal range of motion. Neck supple. Thyromegaly present.  Cardiovascular: Normal rate, regular rhythm and normal heart sounds.  Pulmonary/Chest: Effort normal and breath sounds normal. She has no wheezes.  Abdominal: Soft. Bowel sounds are normal. There is no tenderness.  Musculoskeletal:       Right knee: She exhibits decreased range of motion and swelling. Tenderness found.       Left knee: She exhibits decreased range of motion and swelling. Tenderness found.  Bilateral knee pain, more severe after exertion. Crepitus can be felt with flexion and extension.   Neurological: [name] alert and oriented to person, place, and time.  Skin: Skin is warm and dry.  Psychiatric: She has a normal mood and affect. Her behavior is normal. Judgment and thought content normal.  Nursing note and vitals reviewed.  Assessment/Plan: 1. Morbid obesity (HCC) Continues to do well on appetite suppressant. Reassess in 6 weeks. - phentermine (ADIPEX-P) 37.5 MG tablet; Take 1 tablet (37.5 mg total) by mouth daily before breakfast.  Dispense: 30 tablet; Refill: 1  2. Arthrosis of knee Take ibuprofen during the day as needed for mild to moderate pain. May take tylenol/codeine at bedtime as needed and as prescribed.  - Acetaminophen-Codeine (TYLENOL/CODEINE #3) 300-30 MG tablet; Take 1-2 tablets by mouth at bedtime as needed for pain.  Dispense: 45 tablet; Refill: 1  General Counseling: Arianie verbalizes understanding of the findings of todays visit and agrees with plan of treatment. I have discussed any further diagnostic evaluation that may be needed or ordered today. We  also reviewed her medications today. she has been encouraged to call the office with any questions or concerns that should arise related to todays visit.    There is a liability release in patients' chart. There has been a 10 minute discussion about the side effects including but not limited to elevated blood pressure, anxiety, lack of sleep and dry mouth. Pt understands and will like to start/continue on appetite suppressant at this time. There will be one month RX given at the time of visit with proper follow up. Nova diet plan with restricted calories is given to the pt. Pt understands and agrees with  plan of treatment  This patient was seen by Vincent GrosHeather Zaley Talley, FNP- C in  Collaboration with Dr Lyndon Code as a part of collaborative care agreement   Meds ordered this encounter  Medications  . phentermine (ADIPEX-P) 37.5 MG tablet    Sig: Take 1 tablet (37.5 mg total) by mouth daily before breakfast.    Dispense:  30 tablet    Refill:  1    Order Specific Question:   Supervising Provider    Answer:   Lyndon Code [[id number]]  . Acetaminophen-Codeine (TYLENOL/CODEINE #3) 300-30 MG tablet    Sig: Take 1-2 tablets by mouth at bedtime as needed for pain.    Dispense:  45 tablet    Refill:  1    Order Specific Question:   Supervising Provider    Answer:   Lyndon Code [[id number]]    Time spent: 65 Minutes    Dr Lyndon Code Internal medicine

## 2017-06-21 ENCOUNTER — Ambulatory Visit
Admission: RE | Admit: 2017-06-21 | Discharge: 2017-06-21 | Disposition: A | Discharge status: Home / Self Care | Payer: Medicaid Other | Source: Ambulatory Visit | Attending: Unknown Physician Specialty | Admitting: Unknown Physician Specialty

## 2017-06-21 DIAGNOSIS — R1310 Dysphagia, unspecified: Secondary | ICD-10-CM | POA: Diagnosis present

## 2017-06-21 NOTE — Therapy (Signed)
[facility] DIAGNOSTIC RADIOLOGY [address] [place], [place], [address] Phone: [phone]   Fax:     Modified Barium Swallow  Patient Details  Name: Whitney Brandt MRN: [id number] Date of Birth: 06/18/1990 No Data Recorded  Encounter Date: 06/21/2017  End of Session - 06/21/17 1319    Visit Number  1    Number of Visits  1    Date for SLP Re-Evaluation  06/21/17       Past Medical History:  Diagnosis Date  . Diabetes mellitus without complication [facility](HCC)     Past Surgical History:  Procedure Laterality Date  . ABDOMINAL HYSTERECTOMY      There were no vitals filed for this visit.   Subjective: Patient behavior: (alertness, ability to follow instructions, etc.): Patient able to follow directions but not clearly express her complaints  Chief complaint: Odynophagia- indicates pain located inferior to the larynx   Objective:  Radiological Procedure: A videoflouroscopic evaluation of oral-preparatory, reflex initiation, and pharyngeal phases of the swallow was performed; as well as a screening of the upper esophageal phase.  I. POSTURE: Upright in MBS chair  II. VIEW: Lateral  III. COMPENSATORY STRATEGIES: N/A  IV. BOLUSES ADMINISTERED:   Thin Liquid: 2 cup rim, 4 straw   Nectar-thick Liquid: 4 rapid consecutive   Honey-thick Liquid: DNT   Puree: 2 teaspoon presentations   Mechanical Soft: 1/4 graham cracker in applesauce  V. RESULTS OF EVALUATION: A. ORAL PREPARATORY PHASE: (The lips, tongue, and velum are observed for strength and coordination)       **Overall Severity Rating: Within normal limits  B. SWALLOW INITIATION/REFLEX: (The reflex is normal if "triggered" by the time the bolus reached the base of the tongue)  **Overall Severity Rating: Within normal limits  C. PHARYNGEAL PHASE: (Pharyngeal function is normal if the bolus shows rapid, smooth, and continuous transit through the pharynx and there is no  pharyngeal residue after the swallow)  **Overall Severity Rating: Within normal limits  D. LARYNGEAL PENETRATION: (Material entering into the laryngeal inlet/vestibule but not aspirated)  None  E. ASPIRATION: None  F. ESOPHAGEAL PHASE: (Screening of the upper esophagus) No abnormality within the viewable cervical esophagus  ASSESSMENT: This 27 year old woman; with odynophagia; is presenting with normal oropharyngeal swallowing.  Oral control of the bolus including oral hold, rotary mastication, and anterior to posterior transfer is within normal limits. Timing of the pharyngeal swallow is within normal limits.  Aspects of the pharyngeal stage of swallowing including tongue base retraction, hyolaryngeal excursion, epiglottic inversion, and duration/amplitude of UES opening are within normal limits.  There is no observed pharyngeal residue, laryngeal penetration, or tracheal aspiration.  There were no observed abnormalities within the viewable cervical esophagus.  The patient indicated that the pain was located inferior to the larynx.   PLAN/RECOMMENDATIONS:   A. Diet: Regular   B. Swallowing Precautions: No special precautions indicated   C. Recommended consultation to: follow up with MDs as recommended   D. Therapy recommendations: speech therapy is not indicated   E. Results and recommendations were discussed with the patient and her sister immediately following the study and the final report routed to the referring MD.  Odynophagia  Dysphagia, unspecified type - Plan: DG Swallowing Func-Speech Pathology, DG Swallowing Func-Speech Pathology        Problem List Patient Active Problem List   Diagnosis Date Noted  . Acne 03/22/2017  . Acute pharyngitis 03/22/2017  . Low back pain 03/22/2017  .  Arthrosis of knee 12/02/2013  . Morbid obesity with body mass index of 45.0-49.9 in adult [facility]) 12/02/2013   Whitney Primrose, MS/CCC- SLP  Whitney Brandt 06/21/2017, 1:20  PM  [facility] DIAGNOSTIC RADIOLOGY [address] [place], [place], [address] Phone: [phone]   Fax:     Name: Whitney Brandt MRN: [id number] Date of Birth: February 28, 1991

## 2017-07-03 ENCOUNTER — Other Ambulatory Visit: Payer: Self-pay

## 2017-07-03 MED ORDER — LEVOCETIRIZINE DIHYDROCHLORIDE 5 MG PO TABS: ORAL_TABLET | ORAL | 3 refills | Status: DC

## 2017-09-08 ENCOUNTER — Other Ambulatory Visit: Payer: Self-pay | Admitting: Nurse Practitioner

## 2017-09-08 DIAGNOSIS — M1710 Unilateral primary osteoarthritis, unspecified knee: Secondary | ICD-10-CM

## 2017-09-08 MED ORDER — ACETAMINOPHEN-CODEINE 300-30 MG PO TABS: 1.0000 | ORAL_TABLET | Freq: Every evening | ORAL | 1 refills | Status: DC | PRN

## 2017-09-08 NOTE — Progress Notes (Signed)
Renewed rx for acetaminophen #3 and sent new rx to [facility].

## 2017-09-22 ENCOUNTER — Encounter: Payer: Self-pay | Admitting: Nurse Practitioner

## 2017-09-22 ENCOUNTER — Ambulatory Visit: Payer: Medicaid Other | Admitting: Nurse Practitioner

## 2017-09-22 VITALS — BP 112/79 | HR 96 | Resp 16 | Ht 66.0 in | Wt 205.0 lb

## 2017-09-22 DIAGNOSIS — J309 Allergic rhinitis, unspecified: Secondary | ICD-10-CM | POA: Diagnosis not present

## 2017-09-22 DIAGNOSIS — M1710 Unilateral primary osteoarthritis, unspecified knee: Secondary | ICD-10-CM | POA: Diagnosis not present

## 2017-09-22 MED ORDER — ACETAMINOPHEN-CODEINE 300-30 MG PO TABS: 1.0000 | ORAL_TABLET | Freq: Every evening | ORAL | 2 refills | Status: DC | PRN

## 2017-09-22 MED ORDER — PHENTERMINE HCL 37.5 MG PO TABS: 37.5000 mg | ORAL_TABLET | Freq: Every day | ORAL | 2 refills | Status: DC

## 2017-09-22 NOTE — Progress Notes (Signed)
[facility] [address] [place], [place] [address]  Internal MEDICINE  Office Visit Note  Patient Name: Whitney Brandt  [id number]  [id number]  Date of Service: 10/16/2017   [name] for routine follow up.   Chief Complaint  Patient presents with  . Weight Loss    follow up    The patient continues to take phentermine daily to help with weight loss. Since her most recent visit, she has lost additional 17 pounds. . Going back to 07/2015, she has lost 118 pounds. [name] doing well without negative concerns or complaints. Her diet is generally healthy and she exercises five to six days per week for 30-45 minutes.       Current Medication: Outpatient Encounter Medications as of 09/22/2017  Medication Sig  . Acetaminophen-Codeine (TYLENOL/CODEINE #3) 300-30 MG tablet Take 1-2 tablets by mouth at bedtime as needed for pain.  . chlorhexidine (HIBICLENS) 4 % external liquid   . fluticasone (FLONASE) 50 MCG/ACT nasal spray Place 2 sprays into both nostrils daily.  Marland Kitchen ibuprofen (ADVIL,MOTRIN) 800 MG tablet Take 1 tab po three times a day AS NEEDED FOR PAIN  . levocetirizine (XYZAL) 5 MG tablet Take 1 tab po day for allergic rhinitis  . nystatin cream (MYCOSTATIN)   . phentermine (ADIPEX-P) 37.5 MG tablet Take 1 tablet (37.5 mg total) by mouth daily before breakfast.  . [DISCONTINUED] Acetaminophen-Codeine (TYLENOL/CODEINE #3) 300-30 MG tablet Take 1-2 tablets by mouth at bedtime as needed for pain.  . [DISCONTINUED] phentermine (ADIPEX-P) 37.5 MG tablet Take 1 tablet (37.5 mg total) by mouth daily before breakfast.   No facility-administered encounter medications on file as of 09/22/2017.     Surgical History: Past Surgical History:  Procedure Laterality Date  . ABDOMINAL HYSTERECTOMY      Medical History: Past Medical History:  Diagnosis Date  . Diabetes mellitus without complication (HCC)     Family History: Family History  Problem Relation Age of Onset   . Diabetes Mother   . Hypertension Mother   . Thyroid disease Mother     Social History   Socioeconomic History  . Marital status: Single    Spouse name: Not on file  . Number of children: Not on file  . Years of education: Not on file  . Highest education level: Not on file  Occupational History  . Not on file  Social Needs  . Financial resource strain: Not on file  . Food insecurity:    Worry: Not on file    Inability: Not on file  . Transportation needs:    Medical: Not on file    Non-medical: Not on file  Tobacco Use  . Smoking status: Never Smoker  . Smokeless tobacco: Never Used  Substance and Sexual Activity  . Alcohol use: No    Frequency: Never  . Drug use: No  . Sexual activity: Never  Lifestyle  . Physical activity:    Days per week: Not on file    Minutes per session: Not on file  . Stress: Not on file  Relationships  . Social connections:    Talks on phone: Not on file    Gets together: Not on file    Attends religious service: Not on file    Active member of club or organization: Not on file    Attends meetings of clubs or organizations: Not on file    Relationship status: Not on file  . Intimate partner violence:    Fear  of current or ex partner: Not on file    Emotionally abused: Not on file    Physically abused: Not on file    Forced sexual activity: Not on file  Other Topics Concern  . Not on file  Social History Narrative  . Not on file      Review of Systems  Constitutional: Negative for activity change and appetite change.       Weight loss 17 pounds since most recent visit   HENT: Positive for trouble swallowing. Negative for ear pain.        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 States they were looking good but wants to see her back I n6 months.   Eyes: Negative.   Respiratory: Negative for chest tightness, shortness of breath and wheezing.   Cardiovascular: Negative for chest pain and  palpitations.  Gastrointestinal: Negative for constipation, diarrhea, nausea and vomiting.  Endocrine: Negative for cold intolerance, heat intolerance, polydipsia, polyphagia and polyuria.  Genitourinary: Negative.   Musculoskeletal:       Intermittent knee pain.  Allergic/Immunologic: Negative for environmental allergies.  Neurological: Negative for dizziness, weakness, numbness and headaches.  Hematological: Negative for adenopathy.  Psychiatric/Behavioral: Negative for dysphoric mood. The patient is not nervous/anxious.     Today's Vitals   09/22/17 1435  BP: 112/79  Pulse: 96  Resp: 16  SpO2: 98%  Weight: 205 lb (93 kg)  Height: 5\' 6"  (1.676 m)    Physical Exam  Constitutional: [name] oriented to person, place, and time. She appears well-developed and well-nourished.  Morbidly obese  HENT:  Head: Normocephalic and atraumatic.  Right Ear: External ear normal.  Left Ear: External ear normal.  Nose: Nose normal.  Eyes: Pupils are equal, round, and reactive to light. Conjunctivae and EOM are normal.  Neck: Normal range of motion. Neck supple. Thyromegaly present.  Cardiovascular: Normal rate, regular rhythm and normal heart sounds.  Pulmonary/Chest: Effort normal and breath sounds normal. She has no wheezes.  Abdominal: Soft. Bowel sounds are normal. There is no tenderness.  Musculoskeletal:       Right knee: She exhibits decreased range of motion and swelling. Tenderness found.       Left knee: She exhibits decreased range of motion and swelling. Tenderness found.  Bilateral knee pain, more severe after exertion. Crepitus can be felt with flexion and extension.   Neurological: [name] alert and oriented to person, place, and time.  Patient is at her neurological baseline.   Skin: Skin is warm and dry. Capillary refill takes less than 2 seconds.  Psychiatric: She has a normal mood and affect. Her behavior is normal. Judgment and thought content normal.  Nursing note and  vitals reviewed.  Assessment/Plan:  1. Allergic rhinitis, unspecified seasonality, unspecified trigger contiue oral allergy medication as prescribed .  2. Morbid obesity (HCC) Improve. contineu phentermine 37.5mg  daily. Limit calorie intake to 1500 calories per day. Continue regular exercise as before.  - phentermine (ADIPEX-P) 37.5 MG tablet; Take 1 tablet (37.5 mg total) by mouth daily before breakfast.  Dispense: 30 tablet; Refill: 2  3. Arthrosis of knee May take tylenol #3 as needed and as prescribed. New prescription provided today.  - Acetaminophen-Codeine (TYLENOL/CODEINE #3) 300-30 MG tablet; Take 1-2 tablets by mouth at bedtime as needed for pain.  Dispense: 45 tablet; Refill: 2 General Counseling: Mahlet verbalizes understanding of the findings of todays visit and agrees with plan of treatment. I have discussed any further  diagnostic evaluation that may be needed or ordered today. We also reviewed her medications today. she has been encouraged to call the office with any questions or concerns that should arise related to todays visit.    Counseling:   There is a liability release in patients' chart. There has been a 10 minute discussion about the side effects including but not limited to elevated blood pressure, anxiety, lack of sleep and dry mouth. Pt understands and will like to start/continue on appetite suppressant at this time. There will be one month RX given at the time of visit with proper follow up. Nova diet plan with restricted calories is given to the pt. Pt understands and agrees with  plan of treatment  This patient was seen by Vincent GrosHeather Finlee Concepcion FNP Collaboration with Dr Lyndon CodeFozia M Khan as a part of collaborative care agreement    Meds ordered this encounter  Medications  . phentermine (ADIPEX-P) 37.5 MG tablet    Sig: Take 1 tablet (37.5 mg total) by mouth daily before breakfast.    Dispense:  30 tablet    Refill:  2    Order Specific Question:   Supervising  Provider    Answer:   Lyndon CodeKHAN, FOZIA M [[id number]]  . Acetaminophen-Codeine (TYLENOL/CODEINE #3) 300-30 MG tablet    Sig: Take 1-2 tablets by mouth at bedtime as needed for pain.    Dispense:  45 tablet    Refill:  2    2nd reply to refill request    Order Specific Question:   Supervising Provider    Answer:   Lyndon CodeKHAN, FOZIA M [[id number]]    Time spent: 2915 Minutes      Dr Lyndon CodeFozia M Khan Internal medicine

## 2017-10-16 ENCOUNTER — Ambulatory Visit: Payer: Medicaid Other

## 2017-10-16 DIAGNOSIS — J309 Allergic rhinitis, unspecified: Secondary | ICD-10-CM | POA: Insufficient documentation

## 2017-12-12 ENCOUNTER — Ambulatory Visit: Payer: Medicaid Other | Admitting: Adult Health

## 2017-12-12 ENCOUNTER — Encounter: Payer: Self-pay | Admitting: Adult Health

## 2017-12-12 VITALS — BP 132/76 | HR 84 | Resp 16 | Ht 67.0 in | Wt 197.0 lb

## 2017-12-12 DIAGNOSIS — Z23 Encounter for immunization: Secondary | ICD-10-CM

## 2017-12-12 DIAGNOSIS — M1710 Unilateral primary osteoarthritis, unspecified knee: Secondary | ICD-10-CM

## 2017-12-12 DIAGNOSIS — J309 Allergic rhinitis, unspecified: Secondary | ICD-10-CM | POA: Diagnosis not present

## 2017-12-12 DIAGNOSIS — E010 Iodine-deficiency related diffuse (endemic) goiter: Secondary | ICD-10-CM

## 2017-12-12 MED ORDER — ACETAMINOPHEN-CODEINE 300-30 MG PO TABS: 1.0000 | ORAL_TABLET | Freq: Every evening | ORAL | 2 refills | Status: DC | PRN

## 2017-12-12 MED ORDER — FLUTICASONE PROPIONATE 50 MCG/ACT NA SUSP: 2.0000 | Freq: Every day | NASAL | 6 refills | Status: DC

## 2017-12-12 MED ORDER — PHENTERMINE HCL 37.5 MG PO TABS: 37.5000 mg | ORAL_TABLET | Freq: Every day | ORAL | 0 refills | Status: DC

## 2017-12-12 MED ORDER — IBUPROFEN 800 MG PO TABS: ORAL_TABLET | ORAL | 3 refills | Status: DC

## 2017-12-12 NOTE — Progress Notes (Signed)
[facility] [address] [place], [place] [address]  Internal MEDICINE  Office Visit Note  Patient Name: Whitney Brandt  [id number]  [id number]  Date of Service: 12/22/2017  Chief Complaint  Patient presents with  . Allergies  . Medical Management of Chronic Issues    weight management     HPI Pt here for follow up on allergies, and thyroid Ultrasound as well as weight management.  Her thyroid US shows bilateral nodules and an ENT referral is needed. She reports she is having good results with Flonase and currently needs a refill. She has been taking Phentermine for quite some time.  She has lost 8 pounds since last visit.  She denies chest pain, palpitations, headache or other side effects.       Current Medication: Outpatient Encounter Medications as of 12/12/2017  Medication Sig  . Acetaminophen-Codeine (TYLENOL/CODEINE #3) 300-30 MG tablet Take 1-2 tablets by mouth at bedtime as needed for pain.  . chlorhexidine (HIBICLENS) 4 % external liquid   . fluticasone (FLONASE) 50 MCG/ACT nasal spray Place 2 sprays into both nostrils daily.  Marland Kitchen ibuprofen (ADVIL,MOTRIN) 800 MG tablet Take 1 tab po three times a day AS NEEDED FOR PAIN  . levocetirizine (XYZAL) 5 MG tablet Take 1 tab po day for allergic rhinitis  . nystatin cream (MYCOSTATIN)   . phentermine (ADIPEX-P) 37.5 MG tablet Take 1 tablet (37.5 mg total) by mouth daily before breakfast.  . [DISCONTINUED] Acetaminophen-Codeine (TYLENOL/CODEINE #3) 300-30 MG tablet Take 1-2 tablets by mouth at bedtime as needed for pain.  . [DISCONTINUED] fluticasone (FLONASE) 50 MCG/ACT nasal spray Place 2 sprays into both nostrils daily.  . [DISCONTINUED] ibuprofen (ADVIL,MOTRIN) 800 MG tablet Take 1 tab po three times a day AS NEEDED FOR PAIN  . [DISCONTINUED] phentermine (ADIPEX-P) 37.5 MG tablet Take 1 tablet (37.5 mg total) by mouth daily before breakfast.   No facility-administered encounter medications on file as of 12/12/2017.      Surgical History: Past Surgical History:  Procedure Laterality Date  . ABDOMINAL HYSTERECTOMY      Medical History: Past Medical History:  Diagnosis Date  . Diabetes mellitus without complication (HCC)     Family History: Family History  Problem Relation Age of Onset  . Diabetes Mother   . Hypertension Mother   . Thyroid disease Mother     Social History   Socioeconomic History  . Marital status: Single    Spouse name: Not on file  . Number of children: Not on file  . Years of education: Not on file  . Highest education level: Not on file  Occupational History  . Not on file  Social Needs  . Financial resource strain: Not on file  . Food insecurity:    Worry: Not on file    Inability: Not on file  . Transportation needs:    Medical: Not on file    Non-medical: Not on file  Tobacco Use  . Smoking status: Never Smoker  . Smokeless tobacco: Never Used  Substance and Sexual Activity  . Alcohol use: No    Frequency: Never  . Drug use: No  . Sexual activity: Never  Lifestyle  . Physical activity:    Days per week: Not on file    Minutes per session: Not on file  . Stress: Not on file  Relationships  . Social connections:    Talks on phone: Not on file    Gets together: Not on file    Attends  religious service: Not on file    Active member of club or organization: Not on file    Attends meetings of clubs or organizations: Not on file    Relationship status: Not on file  . Intimate partner violence:    Fear of current or ex partner: Not on file    Emotionally abused: Not on file    Physically abused: Not on file    Forced sexual activity: Not on file  Other Topics Concern  . Not on file  Social History Narrative  . Not on file   Review of Systems  Constitutional: Negative for chills, fatigue and unexpected weight change.  HENT: Negative for congestion, rhinorrhea, sneezing and sore throat.   Eyes: Negative for photophobia, pain and redness.   Respiratory: Negative for cough, chest tightness and shortness of breath.   Cardiovascular: Negative for chest pain and palpitations.  Gastrointestinal: Negative for abdominal pain, constipation, diarrhea, nausea and vomiting.  Endocrine: Negative.   Genitourinary: Negative for dysuria and frequency.  Musculoskeletal: Negative for arthralgias, back pain, joint swelling and neck pain.  Skin: Negative for rash.  Allergic/Immunologic: Negative.   Neurological: Negative for tremors and numbness.  Hematological: Negative for adenopathy. Does not bruise/bleed easily.  Psychiatric/Behavioral: Negative for behavioral problems and sleep disturbance. The patient is not nervous/anxious.     Vital Signs: BP 132/76   Pulse 84   Resp 16   Ht 5\' 7"  (1.702 m)   Wt 197 lb (89.4 kg)   SpO2 95%   BMI 30.85 kg/m    Physical Exam  Constitutional: [name] oriented to person, place, and time. She appears well-developed and well-nourished. No distress.  HENT:  Head: Normocephalic and atraumatic.  Mouth/Throat: Oropharynx is clear and moist. No oropharyngeal exudate.  Eyes: Pupils are equal, round, and reactive to light. EOM are normal.  Neck: Normal range of motion. Neck supple. No JVD present. No tracheal deviation present. No thyromegaly present.  Cardiovascular: Normal rate, regular rhythm and normal heart sounds. Exam reveals no gallop and no friction rub.  No murmur heard. Pulmonary/Chest: Effort normal and breath sounds normal. No respiratory distress. She has no wheezes. She has no rales. She exhibits no tenderness.  Abdominal: Soft. There is no tenderness. There is no guarding.  Musculoskeletal: Normal range of motion.  Lymphadenopathy:    She has no cervical adenopathy.  Neurological: [name] alert and oriented to person, place, and time. No cranial nerve deficit.  Skin: Skin is warm and dry. [name] not diaphoretic.  Psychiatric: She has a normal mood and affect. Her behavior is normal.  Judgment and thought content normal.  Nursing note and vitals reviewed.  Assessment/Plan: 1. Allergic rhinitis, unspecified seasonality, unspecified trigger Continue Flonase as directed.   - fluticasone (FLONASE) 50 MCG/ACT nasal spray; Place 2 sprays into both nostrils daily.  Dispense: 16 g; Refill: 6  2. Thyromegaly - Ambulatory referral to ENT  3. Morbid obesity (HCC) Obesity Counseling: Risk Assessment: An assessment of behavioral risk factors was made today and includes lack of exercise sedentary lifestyle, lack of portion control and poor dietary habits.  Risk Modification Advice: She was counseled on portion control guidelines. Restricting daily caloric intake to. . The detrimental long term effects of obesity on her health and ongoing poor compliance was also discussed with the patient. - phentermine (ADIPEX-P) 37.5 MG tablet; Take 1 tablet (37.5 mg total) by mouth daily before breakfast.  Dispense: 30 tablet; Refill: 0  There is a liability release in  patients' chart. There has been a 10 minute discussion about the side effects including but not limited to elevated blood pressure, anxiety, lack of sleep and dry mouth. Pt understands and will like to start/continue on appetite suppressant at this time. There will be one month RX given at the time of visit with proper follow up. [place] diet plan with restricted calories is given to the pt. Pt understands and agrees with  plan of treatment  4. Arthrosis of knee - ibuprofen (ADVIL,MOTRIN) 800 MG tablet; Take 1 tab po three times a day AS NEEDED FOR PAIN  Dispense: 90 tablet; Refill: 3 - Acetaminophen-Codeine (TYLENOL/CODEINE #3) 300-30 MG tablet; Take 1-2 tablets by mouth at bedtime as needed for pain.  Dispense: 45 tablet; Refill: 2  5. Flu vaccine need - Flu Vaccine MDCK QUAD PF  General Counseling: Finnlee verbalizes understanding of the findings of todays visit and agrees with plan of treatment. I have discussed any further  diagnostic evaluation that may be needed or ordered today. We also reviewed her medications today. she has been encouraged to call the office with any questions or concerns that should arise related to todays visit.    Orders Placed This Encounter  Procedures  . Flu Vaccine MDCK QUAD PF  . Ambulatory referral to ENT    Meds ordered this encounter  Medications  . fluticasone (FLONASE) 50 MCG/ACT nasal spray    Sig: Place 2 sprays into both nostrils daily.    Dispense:  16 g    Refill:  6  . ibuprofen (ADVIL,MOTRIN) 800 MG tablet    Sig: Take 1 tab po three times a day AS NEEDED FOR PAIN    Dispense:  90 tablet    Refill:  3  . Acetaminophen-Codeine (TYLENOL/CODEINE #3) 300-30 MG tablet    Sig: Take 1-2 tablets by mouth at bedtime as needed for pain.    Dispense:  45 tablet    Refill:  2    2nd reply to refill request  . phentermine (ADIPEX-P) 37.5 MG tablet    Sig: Take 1 tablet (37.5 mg total) by mouth daily before breakfast.    Dispense:  30 tablet    Refill:  0    Time spent: 25 Minutes   This patient was seen by Blima Ledger AGNP-C in Collaboration with Dr Lyndon Code as a part of collaborative care agreement    Dr Lyndon Code Internal medicine

## 2018-01-02 ENCOUNTER — Telehealth: Payer: Self-pay | Admitting: Adult Health

## 2018-01-02 NOTE — Telephone Encounter (Signed)
[place] denied tylenol 3

## 2018-01-15 ENCOUNTER — Encounter: Payer: Self-pay | Admitting: Nurse Practitioner

## 2018-01-15 ENCOUNTER — Ambulatory Visit: Payer: Medicaid Other | Admitting: Nurse Practitioner

## 2018-01-15 VITALS — BP 102/67 | HR 61 | Resp 16 | Ht 66.0 in | Wt 195.8 lb

## 2018-01-15 DIAGNOSIS — B372 Candidiasis of skin and nail: Secondary | ICD-10-CM | POA: Diagnosis not present

## 2018-01-15 MED ORDER — PHENTERMINE HCL 37.5 MG PO TABS: 37.5000 mg | ORAL_TABLET | Freq: Every day | ORAL | 1 refills | Status: DC

## 2018-01-15 MED ORDER — NYSTATIN 100000 UNIT/GM EX POWD
Freq: Three times a day (TID) | CUTANEOUS | 3 refills | Status: DC
Start: 2018-01-15 — End: 2018-03-15

## 2018-01-15 NOTE — Progress Notes (Addendum)
[facility] [address] [place], [place] [address]  Internal MEDICINE  Office Visit Note  Patient Name: Whitney Brandt  [id number]  [id number]  Date of Service: 01/15/2018  Chief Complaint  Patient presents with  . Medical Management of Chronic Issues    4wk follow up weight management     The patient continues to take phentermine daily to help with weight loss. Since her most recent visit, she has lost additional two pounds. . Going back to 07/2015, she has lost 125 pounds. [name] doing well without negative concerns or complaints. Her diet is generally healthy and she exercises five to six days per week for 30-45 minutes.  She c/o rash which is developing in the folds of her skin which hand since her weight loss. She would like to have something to prevent the rash from happening.       Current Medication: Outpatient Encounter Medications as of 01/15/2018  Medication Sig  . Acetaminophen-Codeine (TYLENOL/CODEINE #3) 300-30 MG tablet Take 1-2 tablets by mouth at bedtime as needed for pain.  . chlorhexidine (HIBICLENS) 4 % external liquid   . fluticasone (FLONASE) 50 MCG/ACT nasal spray Place 2 sprays into both nostrils daily.  Marland Kitchen ibuprofen (ADVIL,MOTRIN) 800 MG tablet Take 1 tab po three times a day AS NEEDED FOR PAIN  . levocetirizine (XYZAL) 5 MG tablet Take 1 tab po day for allergic rhinitis  . phentermine (ADIPEX-P) 37.5 MG tablet Take 1 tablet (37.5 mg total) by mouth daily before breakfast.  . [DISCONTINUED] nystatin cream (MYCOSTATIN)   . [DISCONTINUED] phentermine (ADIPEX-P) 37.5 MG tablet Take 1 tablet (37.5 mg total) by mouth daily before breakfast.  . nystatin (MYCOSTATIN/NYSTOP) powder Apply topically 3 (three) times daily.   No facility-administered encounter medications on file as of 01/15/2018.     Surgical History: Past Surgical History:  Procedure Laterality Date  . ABDOMINAL HYSTERECTOMY      Medical History: Past Medical History:   Diagnosis Date  . Diabetes mellitus without complication (HCC)     Family History: Family History  Problem Relation Age of Onset  . Diabetes Mother   . Hypertension Mother   . Thyroid disease Mother     Social History   Socioeconomic History  . Marital status: Single    Spouse name: Not on file  . Number of children: Not on file  . Years of education: Not on file  . Highest education level: Not on file  Occupational History  . Not on file  Social Needs  . Financial resource strain: Not on file  . Food insecurity:    Worry: Not on file    Inability: Not on file  . Transportation needs:    Medical: Not on file    Non-medical: Not on file  Tobacco Use  . Smoking status: Never Smoker  . Smokeless tobacco: Never Used  Substance and Sexual Activity  . Alcohol use: No    Frequency: Never  . Drug use: No  . Sexual activity: Never  Lifestyle  . Physical activity:    Days per week: Not on file    Minutes per session: Not on file  . Stress: Not on file  Relationships  . Social connections:    Talks on phone: Not on file    Gets together: Not on file    Attends religious service: Not on file    Active member of club or organization: Not on file    Attends meetings of clubs or organizations:  Not on file    Relationship status: Not on file  . Intimate partner violence:    Fear of current or ex partner: Not on file    Emotionally abused: Not on file    Physically abused: Not on file    Forced sexual activity: Not on file  Other Topics Concern  . Not on file  Social History Narrative  . Not on file      Review of Systems  Constitutional: Negative for activity change and appetite change.       Weight loss 2 pounds since most recent visit   HENT: Negative for congestion, ear pain and trouble swallowing.   Eyes: Negative.   Respiratory: Negative for chest tightness, shortness of breath and wheezing.   Cardiovascular: Negative for chest pain and palpitations.   Gastrointestinal: Negative for constipation, diarrhea, nausea and vomiting.  Endocrine: Negative for cold intolerance, heat intolerance, polydipsia, polyphagia and polyuria.  Genitourinary: Negative.   Musculoskeletal:       Intermittent knee pain.  Allergic/Immunologic: Negative for environmental allergies.  Neurological: Negative for dizziness, weakness, numbness and headaches.  Hematological: Negative for adenopathy.  Psychiatric/Behavioral: Negative for dysphoric mood. The patient is not nervous/anxious.     Today's Vitals   01/15/18 0911  BP: 102/67  Pulse: 61  Resp: 16  SpO2: 99%  Weight: 195 lb 12.8 oz (88.8 kg)  Height: 5\' 6"  (1.676 m)    Physical Exam  Constitutional: [name] oriented to person, place, and time. She appears well-developed and well-nourished.  Morbidly obese  HENT:  Head: Normocephalic and atraumatic.  Right Ear: External ear normal.  Left Ear: External ear normal.  Nose: Nose normal.  Eyes: Pupils are equal, round, and reactive to light. Conjunctivae and EOM are normal.  Neck: Normal range of motion. Neck supple. No thyromegaly present.  Cardiovascular: Normal rate, regular rhythm and normal heart sounds.  Pulmonary/Chest: Effort normal and breath sounds normal. She has no wheezes.  Abdominal: Soft. Bowel sounds are normal. There is no tenderness.  Musculoskeletal:       Right knee: She exhibits decreased range of motion and swelling. Tenderness found.       Left knee: She exhibits decreased range of motion and swelling. Tenderness found.  Bilateral knee pain, more severe after exertion. Crepitus can be felt with flexion and extension.   Neurological: [name] alert and oriented to person, place, and time.  Patient is at her neurological baseline.   Skin: Skin is warm and dry. Capillary refill takes less than 2 seconds.  Red, inflamed rash present in the folds of the upper abdomen. Itchy and sore. Skin intact with no drainage present.   Psychiatric:  She has a normal mood and affect. Her behavior is normal. Judgment and thought content normal.  Nursing note and vitals reviewed.  Assessment/Plan: 1. Cutaneous candidiasis Add nystatin powder, which can be applied two to three times daily to help prevent fungal skin infection. Keeping areas clean and dry will also reduce flare of cutaneous candidiasis.  - nystatin (MYCOSTATIN/NYSTOP) powder; Apply topically 3 (three) times daily.  Dispense: 60 g; Refill: 3  2. Morbid obesity (HCC) Has lost nearly 125 pounds since starting medically managed weight loss program. May continue phentermine. Continue with 1500 calorie diet and regular exercise program.  - phentermine (ADIPEX-P) 37.5 MG tablet; Take 1 tablet (37.5 mg total) by mouth daily before breakfast.  Dispense: 30 tablet; Refill: 1  General Counseling: Amel verbalizes understanding of the findings of todays visit  and agrees with plan of treatment. I have discussed any further diagnostic evaluation that may be needed or ordered today. We also reviewed her medications today. she has been encouraged to call the office with any questions or concerns that should arise related to todays visit.    There is a liability release in patients' chart. There has been a 10 minute discussion about the side effects including but not limited to elevated blood pressure, anxiety, lack of sleep and dry mouth. Pt understands and will like to start/continue on appetite suppressant at this time. There will be one month RX given at the time of visit with proper follow up. [place] diet plan with restricted calories is given to the pt. Pt understands and agrees with  plan of treatment  This patient was seen by Vincent Gros FNP Collaboration with Dr Lyndon Code as a part of collaborative care agreement  Meds ordered this encounter  Medications  . nystatin (MYCOSTATIN/NYSTOP) powder    Sig: Apply topically 3 (three) times daily.    Dispense:  60 g    Refill:  3     Order Specific Question:   Supervising Provider    Answer:   Lyndon Code [[id number]]  . phentermine (ADIPEX-P) 37.5 MG tablet    Sig: Take 1 tablet (37.5 mg total) by mouth daily before breakfast.    Dispense:  30 tablet    Refill:  1    Order Specific Question:   Supervising Provider    Answer:   Lyndon Code [[id number]]    Time spent: 64 Minutes      Dr Lyndon Code Internal medicine

## 2018-01-18 NOTE — Addendum Note (Signed)
Addended by: Vincent Gros on: 01/18/2018 02:44 PM   Modules accepted: Level of Service

## 2018-03-15 ENCOUNTER — Ambulatory Visit: Payer: Medicaid Other | Admitting: Nurse Practitioner

## 2018-03-15 ENCOUNTER — Encounter: Payer: Self-pay | Admitting: Nurse Practitioner

## 2018-03-15 VITALS — BP 103/76 | HR 84 | Resp 16 | Ht 66.0 in | Wt 192.0 lb

## 2018-03-15 DIAGNOSIS — L209 Atopic dermatitis, unspecified: Secondary | ICD-10-CM | POA: Insufficient documentation

## 2018-03-15 DIAGNOSIS — B372 Candidiasis of skin and nail: Secondary | ICD-10-CM

## 2018-03-15 DIAGNOSIS — M1710 Unilateral primary osteoarthritis, unspecified knee: Secondary | ICD-10-CM | POA: Diagnosis not present

## 2018-03-15 MED ORDER — MUPIROCIN 2 % EX OINT: TOPICAL_OINTMENT | CUTANEOUS | 3 refills | Status: DC

## 2018-03-15 MED ORDER — NYSTATIN 100000 UNIT/GM EX OINT: 1.0000 "application " | TOPICAL_OINTMENT | Freq: Three times a day (TID) | CUTANEOUS | 3 refills | Status: DC

## 2018-03-15 MED ORDER — ACETAMINOPHEN-CODEINE 300-30 MG PO TABS: 1.0000 | ORAL_TABLET | Freq: Every evening | ORAL | 2 refills | Status: DC | PRN

## 2018-03-15 MED ORDER — PHENTERMINE HCL 37.5 MG PO TABS: 37.5000 mg | ORAL_TABLET | Freq: Every day | ORAL | 1 refills | Status: DC

## 2018-03-15 NOTE — Progress Notes (Signed)
[facility] [address] [place], [place] [address]  Internal MEDICINE  Office Visit Note  Patient Name: Whitney Brandt  [birth year]  [id number]  Date of Service: 03/15/2018  Chief Complaint  Patient presents with  . Medical Management of Chronic Issues    2 month follow up weight management, excess skin problems underneath the stomach  . Pain    pt is concerned about bruising on both hands and also the right knee from a fall.    The patient is here for routine follow up of weight management. She has been taking phentermine off and on for several months. Has lost more than 100 pounds. Has lost additional 3 pounds since her last visit. Doing well with the medication and would like to continue. Her biggest concern is getting rash, boils and skin breakdown in the folds of the abdominal skin which is loose. Continues to use hibiclens soap and tries to keep the skin dry. Has used multiple types of powders, but the get clumpy and irritating.  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 It is bruised and mildly swollen. [name] able to walk on it without difficulty.       Current Medication: Outpatient Encounter Medications as of 03/15/2018  Medication Sig  . Acetaminophen-Codeine (TYLENOL/CODEINE #3) 300-30 MG tablet Take 1-2 tablets by mouth at bedtime as needed for pain.  . chlorhexidine (HIBICLENS) 4 % external liquid   . fluticasone (FLONASE) 50 MCG/ACT nasal spray Place 2 sprays into both nostrils daily.  Marland Kitchen. ibuprofen (ADVIL,MOTRIN) 800 MG tablet Take 1 tab po three times a day AS NEEDED FOR PAIN  . levocetirizine (XYZAL) 5 MG tablet Take 1 tab po day for allergic rhinitis  . phentermine (ADIPEX-P) 37.5 MG tablet Take 1 tablet (37.5 mg total) by mouth daily before breakfast.  . [DISCONTINUED] Acetaminophen-Codeine (TYLENOL/CODEINE #3) 300-30 MG tablet Take 1-2 tablets by mouth at bedtime as needed for  pain.  . [DISCONTINUED] nystatin (MYCOSTATIN/NYSTOP) powder Apply topically 3 (three) times daily.  . [DISCONTINUED] phentermine (ADIPEX-P) 37.5 MG tablet Take 1 tablet (37.5 mg total) by mouth daily before breakfast.  . mupirocin ointment (BACTROBAN) 2 % Apply to affected areas twice daily as needed  . nystatin ointment (MYCOSTATIN) Apply 1 application topically 3 (three) times daily.   No facility-administered encounter medications on file as of 03/15/2018.     Surgical History: Past Surgical History:  Procedure Laterality Date  . ABDOMINAL HYSTERECTOMY      Medical History: Past Medical History:  Diagnosis Date  . Diabetes mellitus without complication (HCC)     Family History: Family History  Problem Relation Age of Onset  . Diabetes Mother   . Hypertension Mother   . Thyroid disease Mother     Social History   Socioeconomic History  . Marital status: Single    Spouse name: Not on file  . Number of children: Not on file  . Years of education: Not on file  . Highest education level: Not on file  Occupational History  . Not on file  Social Needs  . Financial resource strain: Not on file  . Food insecurity:    Worry: Not on file    Inability: Not on file  . Transportation needs:    Medical: Not on file    Non-medical: Not on file  Tobacco Use  . Smoking status: Never Smoker  . Smokeless tobacco: Never Used  Substance and Sexual Activity  . Alcohol use: No    Frequency: Never  . Drug use: No  . Sexual activity: Never  Lifestyle  . Physical activity:    Days per week: Not on file    Minutes per session: Not on file  . Stress: Not on file  Relationships  . Social connections:    Talks on phone: Not on file    Gets together: Not on file    Attends religious service: Not on file    Active member of club or organization: Not on file    Attends meetings of clubs or organizations: Not on file    Relationship status: Not on file  . Intimate partner  violence:    Fear of current or ex partner: Not on file    Emotionally abused: Not on file    Physically abused: Not on file    Forced sexual activity: Not on file  Other Topics Concern  . Not on file  Social History Narrative  . Not on file      Review of Systems  Constitutional: Negative for activity change and appetite change.       Weight loss 3 pounds since most recent visit   HENT: Negative for congestion, ear pain and trouble swallowing.   Eyes: Negative.   Respiratory: Negative for chest tightness, shortness of breath and wheezing.   Cardiovascular: Negative for chest pain and palpitations.  Gastrointestinal: Negative for constipation, diarrhea, nausea and vomiting.  Endocrine: Negative for cold intolerance, heat intolerance, polydipsia, polyphagia and polyuria.  Genitourinary: Negative.   Musculoskeletal:       Intermittent knee pain.  Skin: Positive for rash.       Skin rash with some areas of breakdown in the folds of the loose abdominal skin.  Allergic/Immunologic: Positive for environmental allergies.  Neurological: Negative.  Negative for dizziness, weakness, numbness and headaches.  Hematological: Negative.  Negative for adenopathy.  Psychiatric/Behavioral: Negative for dysphoric mood. The patient is not nervous/anxious.     Today's Vitals   03/15/18 1350  BP: 103/76  Pulse: 84  Resp: 16  SpO2: 99%  Weight: 192 lb (87.1 kg)  Height: 5\' 6"  (1.676 m)    Physical Exam Vitals signs and nursing note reviewed.  Constitutional:      Appearance: [name] well-developed. [name] obese.     Comments: Morbidly obese  HENT:     Head: Normocephalic and atraumatic.     Right Ear: External ear normal.     Left Ear: External ear normal.     Nose: Nose normal.     Mouth/Throat:     Mouth: Mucous membranes are moist.     Pharynx: Oropharynx is clear.  Eyes:     Conjunctiva/sclera: Conjunctivae normal.     Pupils: Pupils are equal, round, and reactive to light.   Neck:     Musculoskeletal: Normal range of motion and neck supple.     Thyroid: No thyromegaly.  Cardiovascular:     Rate and Rhythm: Normal rate and regular rhythm.     Heart sounds: Normal heart sounds.  Pulmonary:     Effort: Pulmonary effort is normal.     Breath sounds: Normal breath sounds. No wheezing.  Abdominal:     General: Bowel sounds are normal.     Palpations: Abdomen is soft.     Tenderness: There is no abdominal tenderness.  Musculoskeletal:     Right knee: She exhibits decreased range of motion and swelling.  Tenderness found.     Left knee: She exhibits decreased range of motion and swelling. Tenderness found.     Comments: Bilateral knee pain, more severe after exertion. There is bruising and swelling over the right patella. ROM and strength of the right knee is currently intact.   Skin:    General: Skin is warm and dry.     Capillary Refill: Capillary refill takes less than 2 seconds.     Findings: Erythema and rash present.     Comments: Red, inflamed rash present in the folds of the lower abdomen. Itchy and sore. A few pustular lesions are present. Red and inflamed. Skin currently intact with no drainage present.   Neurological:     Mental Status: [name] alert and oriented to person, place, and time.     Comments: Patient is at her neurological baseline.   Psychiatric:        Behavior: Behavior normal.        Thought Content: Thought content normal.        Judgment: Judgment normal.    Assessment/Plan: 1. Cutaneous candidiasis Present in the folds of the loose skin of the abdomen. Nystatin ointment may be used twice daily as needed. May combine with mupirocin ointment for increased relief. Continue to keep the area clean and dry. Reassess at next visit.  - nystatin ointment (MYCOSTATIN); Apply 1 application topically 3 (three) times daily.  Dispense: 90 g; Refill: 3  2. Atopic dermatitis, unspecified type Present in the folds of the loose skin of the  abdomen. Mupirocin ointment may be used twice daily as needed. May combine with nystatin ointment for increased relief. Continue to keep the area clean and dry. Reassess at next visit.  - mupirocin ointment (BACTROBAN) 2 %; Apply to affected areas twice daily as needed  Dispense: 44 g; Refill: 3  3. Arthrosis of knee Rest, ice, and elevated the knee when possible. May take acetaminophen/APAP 5/325mg  at bedtime as needed for pain. Continue use of ibuprofen as needed to reduce pain and inflammation.  - Acetaminophen-Codeine (TYLENOL/CODEINE #3) 300-30 MG tablet; Take 1-2 tablets by mouth at bedtime as needed for pain.  Dispense: 45 tablet; Refill: 2  4. Morbid obesity (HCC) May continue phentermine 37.5mg  tablets daily. Limit calorie intake to 1500calories daily and continue to incorprate exercise into daily routine.  - phentermine (ADIPEX-P) 37.5 MG tablet; Take 1 tablet (37.5 mg total) by mouth daily before breakfast.  Dispense: 30 tablet; Refill: 1   General Counseling: Whitney Brandt verbalizes understanding of the findings of todays visit and agrees with plan of treatment. I have discussed any further diagnostic evaluation that may be needed or ordered today. We also reviewed her medications today. she has been encouraged to call the office with any questions or concerns that should arise related to todays visit.   There is a liability release in patients' chart. There has been a 10 minute discussion about the side effects including but not limited to elevated blood pressure, anxiety, lack of sleep and dry mouth. Pt understands and will like to start/continue on appetite suppressant at this time. There will be one month RX given at the time of visit with proper follow up. Nova diet plan with restricted calories is given to the pt. Pt understands and agrees with  plan of treatment  This patient was seen by Vincent Gros FNP Collaboration with Dr Lyndon Code as a part of collaborative care  agreement  Meds ordered this encounter  Medications  .  mupirocin ointment (BACTROBAN) 2 %    Sig: Apply to affected areas twice daily as needed    Dispense:  44 g    Refill:  3    Order Specific Question:   Supervising Provider    Answer:   Lyndon Code [[id number]]  . nystatin ointment (MYCOSTATIN)    Sig: Apply 1 application topically 3 (three) times daily.    Dispense:  90 g    Refill:  3    Order Specific Question:   Supervising Provider    Answer:   Lyndon Code [[id number]]  . phentermine (ADIPEX-P) 37.5 MG tablet    Sig: Take 1 tablet (37.5 mg total) by mouth daily before breakfast.    Dispense:  30 tablet    Refill:  1    Order Specific Question:   Supervising Provider    Answer:   Lyndon Code [[id number]]  . Acetaminophen-Codeine (TYLENOL/CODEINE #3) 300-30 MG tablet    Sig: Take 1-2 tablets by mouth at bedtime as needed for pain.    Dispense:  45 tablet    Refill:  2    2nd reply to refill request    Order Specific Question:   Supervising Provider    Answer:   Lyndon Code [[id number]]    Time spent: 25 Minutes      Dr Lyndon Code Internal medicine

## 2018-04-26 ENCOUNTER — Encounter: Payer: Self-pay | Admitting: Nurse Practitioner

## 2018-04-26 ENCOUNTER — Ambulatory Visit: Payer: Medicaid Other | Admitting: Nurse Practitioner

## 2018-04-26 VITALS — BP 106/74 | HR 87 | Resp 16 | Ht 66.0 in | Wt 192.8 lb

## 2018-04-26 DIAGNOSIS — M1710 Unilateral primary osteoarthritis, unspecified knee: Secondary | ICD-10-CM

## 2018-04-26 DIAGNOSIS — L209 Atopic dermatitis, unspecified: Secondary | ICD-10-CM

## 2018-04-26 DIAGNOSIS — B372 Candidiasis of skin and nail: Secondary | ICD-10-CM

## 2018-04-26 MED ORDER — PHENTERMINE HCL 37.5 MG PO TABS: 37.5000 mg | ORAL_TABLET | Freq: Every day | ORAL | 1 refills | Status: DC

## 2018-04-26 MED ORDER — ACETAMINOPHEN-CODEINE 300-30 MG PO TABS: 1.0000 | ORAL_TABLET | Freq: Every evening | ORAL | 2 refills | Status: DC | PRN

## 2018-04-26 NOTE — Progress Notes (Signed)
[facility] [address] [place], [place] [address]  Internal MEDICINE  Office Visit Note  Patient Name: Whitney Brandt  [id number]-Jan-1992  [id number]  Date of Service: 05/06/2018  Chief Complaint  Patient presents with  . Medical Management of Chronic Issues    6wk follow up weight management  . Referral    Possible referral to dermatolodist for the discomfort where the extra skin is from her losing weight, causeing lots of discomfort    The patient is here for routine follow up visit for weight management. She has not gained or lost any weight over last 6 weeks. Continue to take phentermine daily. She has lost 125 pounds. [name] doing well without negative concerns or complaints. Her diet is generally healthy and she exercises five to six days per week for 30-45 minutes.  The patient continues to have trouble with her skin in the folds of her abdomen, especially since she has been able to successfully lose weight. She constantly has rash which sometimes develops boils and will break down. These areas get very irritated and tender and will cause localized infection. She cleans skin every day with hibiclens. Uses prescribed powders and creams and these are not helping any longer.       Current Medication: Outpatient Encounter Medications as of 04/26/2018  Medication Sig  . Acetaminophen-Codeine (TYLENOL/CODEINE #3) 300-30 MG tablet Take 1-2 tablets by mouth at bedtime as needed for pain.  . chlorhexidine (HIBICLENS) 4 % external liquid   . fluticasone (FLONASE) 50 MCG/ACT nasal spray Place 2 sprays into both nostrils daily.  Marland Kitchen. ibuprofen (ADVIL,MOTRIN) 800 MG tablet Take 1 tab po three times a day AS NEEDED FOR PAIN  . levocetirizine (XYZAL) 5 MG tablet Take 1 tab po day for allergic rhinitis  . mupirocin ointment (BACTROBAN) 2 % Apply to affected areas twice daily as needed  . nystatin ointment (MYCOSTATIN) Apply 1 application topically 3 (three) times daily.  . phentermine  (ADIPEX-P) 37.5 MG tablet Take 1 tablet (37.5 mg total) by mouth daily before breakfast.  . [DISCONTINUED] Acetaminophen-Codeine (TYLENOL/CODEINE #3) 300-30 MG tablet Take 1-2 tablets by mouth at bedtime as needed for pain.  . [DISCONTINUED] phentermine (ADIPEX-P) 37.5 MG tablet Take 1 tablet (37.5 mg total) by mouth daily before breakfast.   No facility-administered encounter medications on file as of 04/26/2018.     Surgical History: Past Surgical History:  Procedure Laterality Date  . ABDOMINAL HYSTERECTOMY      Medical History: Past Medical History:  Diagnosis Date  . Diabetes mellitus without complication (HCC)     Family History: Family History  Problem Relation Age of Onset  . Diabetes Mother   . Hypertension Mother   . Thyroid disease Mother     Social History   Socioeconomic History  . Marital status: Single    Spouse name: Not on file  . Number of children: Not on file  . Years of education: Not on file  . Highest education level: Not on file  Occupational History  . Not on file  Social Needs  . Financial resource strain: Not on file  . Food insecurity:    Worry: Not on file    Inability: Not on file  . Transportation needs:    Medical: Not on file    Non-medical: Not on file  Tobacco Use  . Smoking status: Never Smoker  . Smokeless tobacco: Never Used  Substance and Sexual Activity  . Alcohol use: No    Frequency:  Never  . Drug use: No  . Sexual activity: Never  Lifestyle  . Physical activity:    Days per week: Not on file    Minutes per session: Not on file  . Stress: Not on file  Relationships  . Social connections:    Talks on phone: Not on file    Gets together: Not on file    Attends religious service: Not on file    Active member of club or organization: Not on file    Attends meetings of clubs or organizations: Not on file    Relationship status: Not on file  . Intimate partner violence:    Fear of current or ex partner: Not on file     Emotionally abused: Not on file    Physically abused: Not on file    Forced sexual activity: Not on file  Other Topics Concern  . Not on file  Social History Narrative  . Not on file      Review of Systems  Constitutional: Negative for activity change and appetite change.  HENT: Negative for congestion, ear pain and trouble swallowing.   Eyes: Negative.   Respiratory: Negative for chest tightness, shortness of breath and wheezing.   Cardiovascular: Negative for chest pain and palpitations.  Gastrointestinal: Negative for constipation, diarrhea, nausea and vomiting.  Endocrine: Negative for cold intolerance, heat intolerance, polydipsia and polyuria.  Genitourinary: Negative.   Musculoskeletal:       Intermittent knee pain.  Skin: Positive for rash.       Skin rash with some areas of breakdown in the folds of the loose abdominal skin.  Allergic/Immunologic: Positive for environmental allergies.  Neurological: Negative.  Negative for dizziness, weakness, numbness and headaches.  Hematological: Negative.  Negative for adenopathy.  Psychiatric/Behavioral: Negative for dysphoric mood. The patient is not nervous/anxious.     Today's Vitals   04/26/18 1420  BP: 106/74  Pulse: 87  Resp: 16  SpO2: 99%  Weight: 192 lb 12.8 oz (87.5 kg)   Body mass index is 31.12 kg/m.  Physical Exam Vitals signs and nursing note reviewed.  Constitutional:      Appearance: [name] well-developed. [name] obese.     Comments: Morbidly obese  HENT:     Head: Normocephalic and atraumatic.     Right Ear: External ear normal.     Left Ear: External ear normal.     Nose: Nose normal.     Mouth/Throat:     Mouth: Mucous membranes are moist.     Pharynx: Oropharynx is clear.  Eyes:     Conjunctiva/sclera: Conjunctivae normal.     Pupils: Pupils are equal, round, and reactive to light.  Neck:     Musculoskeletal: Normal range of motion and neck supple.     Thyroid: No thyromegaly.   Cardiovascular:     Rate and Rhythm: Normal rate and regular rhythm.     Heart sounds: Normal heart sounds.  Pulmonary:     Effort: Pulmonary effort is normal.     Breath sounds: Normal breath sounds. No wheezing.  Abdominal:     General: Bowel sounds are normal.     Palpations: Abdomen is soft.     Tenderness: There is no abdominal tenderness.  Musculoskeletal:     Right knee: She exhibits decreased range of motion and swelling. Tenderness found.     Left knee: She exhibits decreased range of motion and swelling. Tenderness found.     Comments: Bilateral knee pain, more  severe after exertion. There is bruising and swelling over the right patella. ROM and strength of the right knee is currently intact.   Skin:    General: Skin is warm and dry.     Capillary Refill: Capillary refill takes less than 2 seconds.     Findings: Erythema and rash present.     Comments: Red, inflamed rash present in the folds of the lower abdomen. Itchy and sore. A few pustular lesions are present. Red and inflamed. Skin currently intact with no drainage present.   Neurological:     Mental Status: [name] alert and oriented to person, place, and time. Mental status is at baseline.     Comments: Patient is at her neurological baseline.   Psychiatric:        Behavior: Behavior normal.        Thought Content: Thought content normal.        Judgment: Judgment normal.    Assessment/Plan: 1. Atopic dermatitis, unspecified type Continue using prescribed skin cleanser with creams/ointments to treat dermatitis. Refer to dermatology for further evaluation and treatment.  - Ambulatory referral to Dermatology  2. Cutaneous candidiasis Continue using prescribed skin cleanser with creams/ointments to treat dermatitis. Refer to dermatology for further evaluation and treatment.  - Ambulatory referral to Dermatology  3. Arthrosis of knee Patient continues to have chronic pain of the knees. Renew tylenol #3 at bedtime  as needed. New prescription was sent to her pharmacy   - Acetaminophen-Codeine (TYLENOL/CODEINE #3) 300-30 MG tablet; Take 1-2 tablets by mouth at bedtime as needed for pain.  Dispense: 45 tablet; Refill: 2  4. Morbid obesity (HCC) Continue phentermine daily. Limit calorie intake to 1500 calories per day and continue with daily exercise program.  - phentermine (ADIPEX-P) 37.5 MG tablet; Take 1 tablet (37.5 mg total) by mouth daily before breakfast.  Dispense: 30 tablet; Refill: 1  General Counseling: Elizebath verbalizes understanding of the findings of todays visit and agrees with plan of treatment. I have discussed any further diagnostic evaluation that may be needed or ordered today. We also reviewed her medications today. she has been encouraged to call the office with any questions or concerns that should arise related to todays visit.   There is a liability release in patients' chart. There has been a 10 minute discussion about the side effects including but not limited to elevated blood pressure, anxiety, lack of sleep and dry mouth. Pt understands and will like to start/continue on appetite suppressant at this time. There will be one month RX given at the time of visit with proper follow up. Nova diet plan with restricted calories is given to the pt. Pt understands and agrees with  plan of treatment  This patient was seen by Vincent Gros FNP Collaboration with Dr Lyndon Code as a part of collaborative care agreement  Orders Placed This Encounter  Procedures  . Ambulatory referral to Dermatology    Meds ordered this encounter  Medications  . Acetaminophen-Codeine (TYLENOL/CODEINE #3) 300-30 MG tablet    Sig: Take 1-2 tablets by mouth at bedtime as needed for pain.    Dispense:  45 tablet    Refill:  2    2nd reply to refill request    Order Specific Question:   Supervising Provider    Answer:   Lyndon Code [[id number]]  . phentermine (ADIPEX-P) 37.5 MG tablet    Sig: Take 1 tablet  (37.5 mg total) by mouth daily before breakfast.  Dispense:  30 tablet    Refill:  1    Order Specific Question:   Supervising Provider    Answer:   Lyndon Code [[id number]]    Time spent: 27 Minutes      Dr Lyndon Code Internal medicine

## 2018-04-27 ENCOUNTER — Telehealth: Payer: Self-pay | Admitting: Nurse Practitioner

## 2018-04-27 NOTE — Telephone Encounter (Signed)
Prior auth for tylenol 3 is not approved, Medicaid is not covering pain medications unless medical criteria is met. Pharmacy notified that it is not covered and not approved,  With coupon medication will cost $16.60 Left message on home answering machine for patient to return call so I can go over this with her .

## 2018-05-14 ENCOUNTER — Encounter: Payer: Self-pay | Admitting: Nurse Practitioner

## 2018-05-14 ENCOUNTER — Ambulatory Visit (INDEPENDENT_AMBULATORY_CARE_PROVIDER_SITE_OTHER): Payer: Medicaid Other | Admitting: Nurse Practitioner

## 2018-05-14 VITALS — BP 115/72 | HR 65 | Temp 95.7°F | Resp 16 | Ht 66.0 in | Wt 192.0 lb

## 2018-05-14 DIAGNOSIS — M1710 Unilateral primary osteoarthritis, unspecified knee: Secondary | ICD-10-CM | POA: Diagnosis not present

## 2018-05-14 DIAGNOSIS — L02211 Cutaneous abscess of abdominal wall: Secondary | ICD-10-CM | POA: Diagnosis not present

## 2018-05-14 DIAGNOSIS — L209 Atopic dermatitis, unspecified: Secondary | ICD-10-CM | POA: Diagnosis not present

## 2018-05-14 MED ORDER — SULFAMETHOXAZOLE-TRIMETHOPRIM 800-160 MG PO TABS: 1.0000 | ORAL_TABLET | Freq: Two times a day (BID) | ORAL | 0 refills | Status: DC

## 2018-05-14 MED ORDER — ACETAMINOPHEN-CODEINE 300-30 MG PO TABS: 1.0000 | ORAL_TABLET | Freq: Four times a day (QID) | ORAL | 0 refills | Status: AC | PRN

## 2018-05-14 MED ORDER — MUPIROCIN 2 % EX OINT: TOPICAL_OINTMENT | CUTANEOUS | 3 refills | Status: DC

## 2018-05-14 NOTE — Progress Notes (Signed)
[facility] [address] [place], [place] [address]  Internal MEDICINE  Office Visit Note  Patient Name: Whitney Brandt  [id number]  [id number]  Date of Service: 05/20/2018   [name] for a sick visit.  Chief Complaint  Patient presents with  . Recurrent Skin Infections    Pt has a boil where her skin is overlapping in the front area of belly causing a lot of pain and discomfort, that has popped and looks to be infected.     The patient has developed abscess/cellulitis of the skin in the fold of skin of abdomen. This is lower, left part of the abdomen. Is draining serosanguinous fluid. Very red and tender. Family has been keeping this as clean and dry as possible. Keeping it covered with clean, dry gauze. Continues to get worse .       Current Medication:  Outpatient Encounter Medications as of 05/14/2018  Medication Sig  . Acetaminophen-Codeine (TYLENOL/CODEINE #3) 300-30 MG tablet Take 1-2 tablets by mouth every 6 (six) hours as needed for up to 7 days for pain.  . chlorhexidine (HIBICLENS) 4 % external liquid   . fluticasone (FLONASE) 50 MCG/ACT nasal spray Place 2 sprays into both nostrils daily.  Marland Kitchen ibuprofen (ADVIL,MOTRIN) 800 MG tablet Take 1 tab po three times a day AS NEEDED FOR PAIN  . levocetirizine (XYZAL) 5 MG tablet Take 1 tab po day for allergic rhinitis  . mupirocin ointment (BACTROBAN) 2 % Apply to affected areas twice daily as needed  . nystatin ointment (MYCOSTATIN) Apply 1 application topically 3 (three) times daily.  . phentermine (ADIPEX-P) 37.5 MG tablet Take 1 tablet (37.5 mg total) by mouth daily before breakfast.  . sulfamethoxazole-trimethoprim (BACTRIM DS,SEPTRA DS) 800-160 MG tablet Take 1 tablet by mouth 2 (two) times daily.  . [DISCONTINUED] Acetaminophen-Codeine (TYLENOL/CODEINE #3) 300-30 MG tablet Take 1-2 tablets by mouth at bedtime as needed for pain.  . [DISCONTINUED] mupirocin ointment (BACTROBAN) 2 % Apply to affected  areas twice daily as needed   No facility-administered encounter medications on file as of 05/14/2018.       Medical History: Past Medical History:  Diagnosis Date  . Diabetes mellitus without complication (HCC)      Today's Vitals   05/14/18 1022  BP: 115/72  Pulse: 65  Resp: 16  Temp: (!) 95.7 F (35.4 C)  SpO2: 100%  Weight: 192 lb (87.1 kg)  Height: 5\' 6"  (1.676 m)   Body mass index is 30.99 kg/m.  Review of Systems  Constitutional: Negative for activity change, appetite change and fatigue.  HENT: Negative for congestion, ear pain and trouble swallowing.   Eyes: Negative.   Respiratory: Negative for chest tightness, shortness of breath and wheezing.   Cardiovascular: Negative for chest pain and palpitations.  Gastrointestinal: Negative for constipation, diarrhea, nausea and vomiting.  Endocrine: Negative for cold intolerance, heat intolerance, polydipsia and polyuria.  Genitourinary: Negative.   Musculoskeletal:       Intermittent knee pain.  Skin: Positive for rash.       There is open lesion in the left lower aspect of the abdomen. Draining some blood-tinged fluid. Very tender. Getting worse .  Allergic/Immunologic: Positive for environmental allergies.  Neurological: Negative.  Negative for dizziness, weakness, numbness and headaches.  Hematological: Negative.  Negative for adenopathy.  Psychiatric/Behavioral: Negative for dysphoric mood. The patient is not nervous/anxious.     Physical Exam Vitals signs and nursing note reviewed.  Constitutional:      Appearance:  [name] well-developed. [name] obese.     Comments: Morbidly obese  HENT:     Head: Normocephalic and atraumatic.     Right Ear: External ear normal.     Left Ear: External ear normal.     Nose: Nose normal.     Mouth/Throat:     Mouth: Mucous membranes are moist.     Pharynx: Oropharynx is clear.  Eyes:     Conjunctiva/sclera: Conjunctivae normal.     Pupils: Pupils are equal, round, and  reactive to light.  Neck:     Musculoskeletal: Normal range of motion and neck supple.     Thyroid: No thyromegaly.  Cardiovascular:     Rate and Rhythm: Normal rate and regular rhythm.     Heart sounds: Normal heart sounds.  Pulmonary:     Effort: Pulmonary effort is normal.     Breath sounds: Normal breath sounds. No wheezing.  Abdominal:     General: Bowel sounds are normal.     Palpations: Abdomen is soft.     Tenderness: There is no abdominal tenderness.  Musculoskeletal:     Right knee: She exhibits decreased range of motion and swelling. Tenderness found.     Left knee: She exhibits decreased range of motion and swelling. Tenderness found.     Comments: Bilateral knee pain, more severe after exertion. There is bruising and swelling over the right patella. ROM and strength of the right knee is currently intact.   Skin:    General: Skin is warm and dry.     Capillary Refill: Capillary refill takes less than 2 seconds.     Findings: Erythema and rash present.     Comments: Red, inflamed rash present in the folds of the lower abdomen.now an open area in the lower, left aspect of the abdomen. Very red and tender. Draining moderate amount of serosanguinous fluid.   Neurological:     Mental Status: [name] alert and oriented to person, place, and time. Mental status is at baseline.     Comments: Patient is at her neurological baseline.   Psychiatric:        Behavior: Behavior normal.        Thought Content: Thought content normal.        Judgment: Judgment normal.   Assessment/Plan: 1. Cutaneous abscess of abdominal wall Worsening. Start bactrim DS bid for 14 days to treat bacterial infection. Continue to keep area clean and dry. Apply bactroban ointment twice daily. Referral to dermatology made at prior visit for further evaluation and treatment.  - sulfamethoxazole-trimethoprim (BACTRIM DS,SEPTRA DS) 800-160 MG tablet; Take 1 tablet by mouth 2 (two) times daily.  Dispense: 28  tablet; Refill: 0  2. Atopic dermatitis, unspecified type Worsening. Start bactrim DS bid for 14 days to treat bacterial infection. Continue to keep area clean and dry. Apply bactroban ointment twice daily. Referral to dermatology made at prior visit for further evaluation and treatment.   - mupirocin ointment (BACTROBAN) 2 %; Apply to affected areas twice daily as needed  Dispense: 44 g; Refill: 3  3. Arthrosis of knee Renew prescription for tylenol #3, taking one to two tablets at bedtime as needed for moderate pain. May also improve pain associated with cutaneous abscess.  - Acetaminophen-Codeine (TYLENOL/CODEINE #3) 300-30 MG tablet; Take 1-2 tablets by mouth every 6 (six) hours as needed for up to 7 days for pain.  Dispense: 30 tablet; Refill: 0  General Counseling: Whitney Brandt verbalizes understanding of the findings of  todays visit and agrees with plan of treatment. I have discussed any further diagnostic evaluation that may be needed or ordered today. We also reviewed her medications today. she has been encouraged to call the office with any questions or concerns that should arise related to todays visit.    Counseling:  This patient was seen by Vincent GrosHeather Angelica Frandsen FNP Collaboration with Dr Lyndon CodeFozia M Khan as a part of collaborative care agreement  Meds ordered this encounter  Medications  . sulfamethoxazole-trimethoprim (BACTRIM DS,SEPTRA DS) 800-160 MG tablet    Sig: Take 1 tablet by mouth 2 (two) times daily.    Dispense:  28 tablet    Refill:  0    Order Specific Question:   Supervising Provider    Answer:   Lyndon CodeKHAN, FOZIA M [[id number]]  . mupirocin ointment (BACTROBAN) 2 %    Sig: Apply to affected areas twice daily as needed    Dispense:  44 g    Refill:  3    Order Specific Question:   Supervising Provider    Answer:   Lyndon CodeKHAN, FOZIA M [[id number]]  . Acetaminophen-Codeine (TYLENOL/CODEINE #3) 300-30 MG tablet    Sig: Take 1-2 tablets by mouth every 6 (six) hours as needed for up to 7 days for  pain.    Dispense:  30 tablet    Refill:  0    2nd reply to refill request    Order Specific Question:   Supervising Provider    Answer:   Lyndon CodeKHAN, FOZIA M [[id number]]    Time spent: 25 Minutes

## 2018-05-20 DIAGNOSIS — L02211 Cutaneous abscess of abdominal wall: Secondary | ICD-10-CM | POA: Insufficient documentation

## 2018-06-08 ENCOUNTER — Ambulatory Visit: Payer: Medicaid Other | Admitting: Nurse Practitioner

## 2018-06-08 ENCOUNTER — Encounter: Payer: Self-pay | Admitting: Nurse Practitioner

## 2018-06-08 VITALS — BP 111/75 | HR 75 | Resp 16 | Ht 66.0 in | Wt 191.8 lb

## 2018-06-08 DIAGNOSIS — L209 Atopic dermatitis, unspecified: Secondary | ICD-10-CM | POA: Diagnosis not present

## 2018-06-08 DIAGNOSIS — L02211 Cutaneous abscess of abdominal wall: Secondary | ICD-10-CM

## 2018-06-08 DIAGNOSIS — M1710 Unilateral primary osteoarthritis, unspecified knee: Secondary | ICD-10-CM

## 2018-06-08 DIAGNOSIS — L659 Nonscarring hair loss, unspecified: Secondary | ICD-10-CM | POA: Diagnosis not present

## 2018-06-08 MED ORDER — PHENTERMINE HCL 37.5 MG PO TABS: 37.5000 mg | ORAL_TABLET | Freq: Every day | ORAL | 1 refills | Status: DC

## 2018-06-08 MED ORDER — SULFAMETHOXAZOLE-TRIMETHOPRIM 800-160 MG PO TABS: 1.0000 | ORAL_TABLET | Freq: Two times a day (BID) | ORAL | 0 refills | Status: DC

## 2018-06-08 MED ORDER — ACETAMINOPHEN-CODEINE 300-30 MG PO TABS: 1.0000 | ORAL_TABLET | Freq: Four times a day (QID) | ORAL | 1 refills | Status: DC | PRN

## 2018-06-08 MED ORDER — MUPIROCIN 2 % EX OINT: TOPICAL_OINTMENT | CUTANEOUS | 3 refills | Status: DC

## 2018-06-08 NOTE — Progress Notes (Signed)
[facility] [address] [place], [place] [address]  Internal MEDICINE  Office Visit Note  Patient Name: Whitney Brandt  [id number]  [id number]  Date of Service: 06/23/2018  Chief Complaint  Patient presents with  . Follow-up    still has spot on stomach    The patient is here for routine follow up visit for weight management. She has lost one pound since her last visit. Continues to take  phentermine daily. She has lost 125 pounds. [name] doing well without negative concerns or complaints. Her diet is generally healthy. She states she has slacked some with regular exercise.  The patient continues to have trouble with her skin in the folds of her abdomen, especially since she has been able to successfully lose weight. She constantly has rash which sometimes develops boils and will break down. These areas get very irritated and tender and will cause localized infection. She cleans skin every day with hibiclens. Uses prescribed powders and creams and these are not helping any longer. A referral has been made to dermatology for this issue, however, she has not been contacted yet for this referral.  The patient's sister has noted the patient's hair is thinning, recently. There is family history of thyroid disease.       Current Medication: Outpatient Encounter Medications as of 06/08/2018  Medication Sig  . chlorhexidine (HIBICLENS) 4 % external liquid   . fluticasone (FLONASE) 50 MCG/ACT nasal spray Place 2 sprays into both nostrils daily.  Marland Kitchen ibuprofen (ADVIL,MOTRIN) 800 MG tablet Take 1 tab po three times a day AS NEEDED FOR PAIN  . levocetirizine (XYZAL) 5 MG tablet Take 1 tab po day for allergic rhinitis  . mupirocin ointment (BACTROBAN) 2 % Apply to affected areas twice daily as needed  . nystatin ointment (MYCOSTATIN) Apply 1 application topically 3 (three) times daily.  . phentermine (ADIPEX-P) 37.5 MG tablet Take 1 tablet (37.5 mg total) by mouth daily before  breakfast.  . sulfamethoxazole-trimethoprim (BACTRIM DS,SEPTRA DS) 800-160 MG tablet Take 1 tablet by mouth 2 (two) times daily.  . [DISCONTINUED] mupirocin ointment (BACTROBAN) 2 % Apply to affected areas twice daily as needed  . [DISCONTINUED] phentermine (ADIPEX-P) 37.5 MG tablet Take 1 tablet (37.5 mg total) by mouth daily before breakfast.  . [DISCONTINUED] sulfamethoxazole-trimethoprim (BACTRIM DS,SEPTRA DS) 800-160 MG tablet Take 1 tablet by mouth 2 (two) times daily.  . Acetaminophen-Codeine (TYLENOL/CODEINE #3) 300-30 MG tablet Take 1 tablet by mouth every 6 (six) hours as needed for pain.   No facility-administered encounter medications on file as of 06/08/2018.     Surgical History: Past Surgical History:  Procedure Laterality Date  . ABDOMINAL HYSTERECTOMY      Medical History: Past Medical History:  Diagnosis Date  . Diabetes mellitus without complication (HCC)     Family History: Family History  Problem Relation Age of Onset  . Diabetes Mother   . Hypertension Mother   . Thyroid disease Mother     Social History   Socioeconomic History  . Marital status: Single    Spouse name: Not on file  . Number of children: Not on file  . Years of education: Not on file  . Highest education level: Not on file  Occupational History  . Not on file  Social Needs  . Financial resource strain: Not on file  . Food insecurity:    Worry: Not on file    Inability: Not on file  . Transportation needs:    Medical:  Not on file    Non-medical: Not on file  Tobacco Use  . Smoking status: Never Smoker  . Smokeless tobacco: Never Used  Substance and Sexual Activity  . Alcohol use: No    Frequency: Never  . Drug use: No  . Sexual activity: Never  Lifestyle  . Physical activity:    Days per week: Not on file    Minutes per session: Not on file  . Stress: Not on file  Relationships  . Social connections:    Talks on phone: Not on file    Gets together: Not on file     Attends religious service: Not on file    Active member of club or organization: Not on file    Attends meetings of clubs or organizations: Not on file    Relationship status: Not on file  . Intimate partner violence:    Fear of current or ex partner: Not on file    Emotionally abused: Not on file    Physically abused: Not on file    Forced sexual activity: Not on file  Other Topics Concern  . Not on file  Social History Narrative  . Not on file      Review of Systems  Constitutional: Negative for activity change and appetite change.  HENT: Negative for congestion, ear pain and trouble swallowing.   Respiratory: Negative for chest tightness, shortness of breath and wheezing.   Cardiovascular: Negative for chest pain and palpitations.  Gastrointestinal: Negative for constipation, diarrhea, nausea and vomiting.  Endocrine: Negative for cold intolerance, heat intolerance, polydipsia and polyuria.       Hair thinning.   Musculoskeletal: Positive for arthralgias.       Intermittent knee pain.  Skin: Positive for rash.       Skin rash with some areas of breakdown in the folds of the loose abdominal skin.  Allergic/Immunologic: Positive for environmental allergies.  Neurological: Negative for dizziness, weakness, numbness and headaches.  Hematological: Negative.  Negative for adenopathy.  Psychiatric/Behavioral: Negative for dysphoric mood. The patient is not nervous/anxious.    Today's Vitals   06/08/18 1356  BP: 111/75  Pulse: 75  Resp: 16  SpO2: 100%  Weight: 191 lb 12.8 oz (87 kg)  Height: 5\' 6"  (1.676 m)   Body mass index is 30.96 kg/m.  Physical Exam Vitals signs and nursing note reviewed.  Constitutional:      Appearance: [name] well-developed. [name] obese.  HENT:     Head: Normocephalic and atraumatic.     Right Ear: External ear normal.     Left Ear: External ear normal.     Nose: Nose normal.     Mouth/Throat:     Mouth: Mucous membranes are moist.      Pharynx: Oropharynx is clear.  Eyes:     Conjunctiva/sclera: Conjunctivae normal.     Pupils: Pupils are equal, round, and reactive to light.  Neck:     Musculoskeletal: Normal range of motion and neck supple.     Thyroid: No thyromegaly.  Cardiovascular:     Rate and Rhythm: Normal rate and regular rhythm.     Heart sounds: Normal heart sounds.  Pulmonary:     Effort: Pulmonary effort is normal.     Breath sounds: Normal breath sounds. No wheezing.  Abdominal:     General: Bowel sounds are normal.     Palpations: Abdomen is soft.     Tenderness: There is no abdominal tenderness.  Musculoskeletal:  Right knee: She exhibits decreased range of motion and swelling. Tenderness found.     Left knee: She exhibits decreased range of motion and swelling. Tenderness found.     Comments: Bilateral knee pain, more severe after exertion. There is bruising and swelling over the right patella. ROM and strength of the right knee is currently intact.   Skin:    General: Skin is warm and dry.     Capillary Refill: Capillary refill takes less than 2 seconds.     Findings: Erythema and rash present.     Comments: Red, inflamed rash present in the folds of the lower abdomen. Itchy and sore. A few pustular lesions are present. Red and inflamed. Skin currently intact with no drainage present.   Neurological:     Mental Status: [name] alert and oriented to person, place, and time. Mental status is at baseline.     Comments: Patient is at her neurological baseline.   Psychiatric:        Behavior: Behavior normal.        Thought Content: Thought content normal.        Judgment: Judgment normal.   Assessment/Plan: 1. Cutaneous abscess of abdominal wall Continue with antibiotic treatment with bactrim DS twice daily for 2 weeks. Will check dermatology referral for further evaluation and treatment. - sulfamethoxazole-trimethoprim (BACTRIM DS,SEPTRA DS) 800-160 MG tablet; Take 1 tablet by mouth 2 (two)  times daily.  Dispense: 28 tablet; Refill: 0  2. Atopic dermatitis, unspecified type Continue with antibiotic treatment with bactrim DS twice daily for 2 weeks. Will check dermatology referral for further evaluation and treatment. - mupirocin ointment (BACTROBAN) 2 %; Apply to affected areas twice daily as needed  Dispense: 44 g; Refill: 3  3. Alopecia Check thyroid panel for further evaluation.   4. Morbid obesity (HCC) Continues to do well. Continue phentermine daily. Limit calorie intake to 1200 calories per day and exercise regularly.  - phentermine (ADIPEX-P) 37.5 MG tablet; Take 1 tablet (37.5 mg total) by mouth daily before breakfast.  Dispense: 30 tablet; Refill: 1  5. Arthrosis of knee May continue to take tylenol #3 at bedtime as needed for pain. New prescription sent to her pharmacy today.  - Acetaminophen-Codeine (TYLENOL/CODEINE #3) 300-30 MG tablet; Take 1 tablet by mouth every 6 (six) hours as needed for pain.  Dispense: 30 tablet; Refill: 1  General Counseling: Rex verbalizes understanding of the findings of todays visit and agrees with plan of treatment. I have discussed any further diagnostic evaluation that may be needed or ordered today. We also reviewed her medications today. she has been encouraged to call the office with any questions or concerns that should arise related to todays visit.    There is a liability release in patients' chart. There has been a 10 minute discussion about the side effects including but not limited to elevated blood pressure, anxiety, lack of sleep and dry mouth. Pt understands and will like to start/continue on appetite suppressant at this time. There will be one month RX given at the time of visit with proper follow up. Nova diet plan with restricted calories is given to the pt. Pt understands and agrees with  plan of treatment  This patient was seen by Vincent Gros FNP Collaboration with Dr Lyndon Code as a part of collaborative  care agreement  Meds ordered this encounter  Medications  . phentermine (ADIPEX-P) 37.5 MG tablet    Sig: Take 1 tablet (37.5 mg total) by mouth  daily before breakfast.    Dispense:  30 tablet    Refill:  1    Order Specific Question:   Supervising Provider    Answer:   Lyndon Code [[id number]]  . sulfamethoxazole-trimethoprim (BACTRIM DS,SEPTRA DS) 800-160 MG tablet    Sig: Take 1 tablet by mouth 2 (two) times daily.    Dispense:  28 tablet    Refill:  0    Order Specific Question:   Supervising Provider    Answer:   Lyndon Code [[id number]]  . mupirocin ointment (BACTROBAN) 2 %    Sig: Apply to affected areas twice daily as needed    Dispense:  44 g    Refill:  3    Order Specific Question:   Supervising Provider    Answer:   Lyndon Code [[id number]]  . Acetaminophen-Codeine (TYLENOL/CODEINE #3) 300-30 MG tablet    Sig: Take 1 tablet by mouth every 6 (six) hours as needed for pain.    Dispense:  30 tablet    Refill:  1    This is not for acute pain. 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    Order Specific Question:   Supervising Provider    Answer:   Lyndon Code [[id number]]    Time spent:25 Minutes      Dr Lyndon Code Internal medicine

## 2018-07-11 IMAGING — RF DG SWALLOWING FUNCTION - NRPT [facility]
1 series · 1 of 1 positions shown · non-contrast
Comparison: none

[Series 1: cp_standard · 0.25mm/px · 1 of 1 slices shown]
[im 1/1]
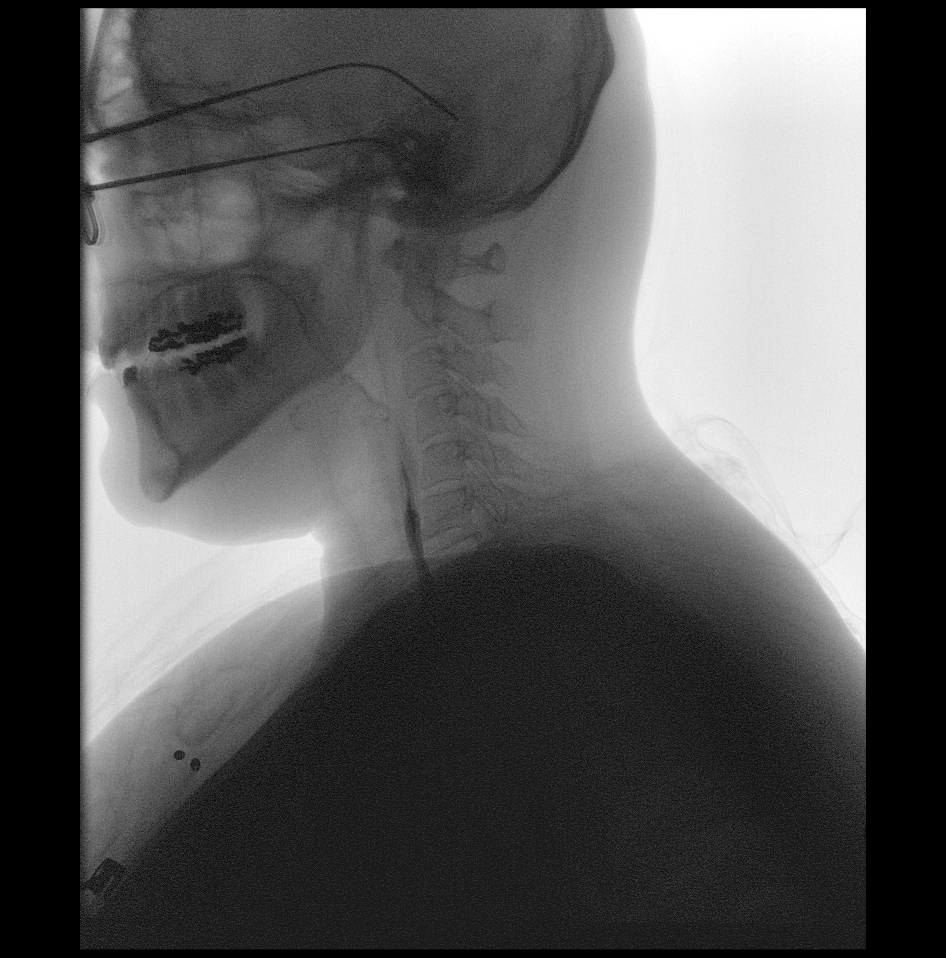

[1 of 1 positions shown; findings below may reference images not displayed]

FLUOROSCOPY FOR SWALLOWING FUNCTION STUDY:
Fluoroscopy was provided for swallowing function study, which was administered by a speech pathologist.  Final results and recommendations from this study are contained within the speech pathology report.

## 2018-07-20 ENCOUNTER — Ambulatory Visit: Payer: Self-pay | Admitting: Nurse Practitioner

## 2018-07-23 ENCOUNTER — Ambulatory Visit: Payer: Self-pay | Admitting: Nurse Practitioner

## 2018-08-07 ENCOUNTER — Other Ambulatory Visit: Payer: Self-pay

## 2018-08-07 ENCOUNTER — Encounter: Payer: Self-pay | Admitting: Nurse Practitioner

## 2018-08-07 ENCOUNTER — Ambulatory Visit: Payer: Medicaid Other | Admitting: Nurse Practitioner

## 2018-08-07 VITALS — BP 111/66 | HR 62 | Temp 97.1°F | Resp 16 | Ht 66.0 in | Wt 200.6 lb

## 2018-08-07 DIAGNOSIS — L02211 Cutaneous abscess of abdominal wall: Secondary | ICD-10-CM | POA: Diagnosis not present

## 2018-08-07 DIAGNOSIS — M1710 Unilateral primary osteoarthritis, unspecified knee: Secondary | ICD-10-CM

## 2018-08-07 MED ORDER — PHENTERMINE HCL 37.5 MG PO TABS: 37.5000 mg | ORAL_TABLET | Freq: Every day | ORAL | 1 refills | Status: DC

## 2018-08-07 MED ORDER — SULFAMETHOXAZOLE-TRIMETHOPRIM 800-160 MG PO TABS: 1.0000 | ORAL_TABLET | Freq: Two times a day (BID) | ORAL | 0 refills | Status: DC

## 2018-08-07 MED ORDER — ACETAMINOPHEN-CODEINE 300-30 MG PO TABS: 1.0000 | ORAL_TABLET | Freq: Four times a day (QID) | ORAL | 1 refills | Status: DC | PRN

## 2018-08-07 NOTE — Progress Notes (Signed)
[facility] [address] [place], [place] [address]  Internal MEDICINE  Office Visit Note  Patient Name: Whitney Brandt  [id number]  [id number]  Date of Service: 08/27/2018  Chief Complaint  Patient presents with  . Medical Management of Chronic Issues    weight management  . Ear Drainage    both    The patient is here for routine follow up visit for weight management. She has nine pound weight gain since her last visit. She did stay with a family friend for about two weeks. Portion control and exercise regimen were neglected during this stay. This is first weight gain since she started on weight management program.  Continues to take  phentermine daily. She has lost 125 pounds. [name] doing well without negative concerns or complaints. Her diet is generally healthy. She states she has slacked some with regular exercise. [name] having drainage from both ears. Ears are hurting. Has been going on for a few days. No fever or headache. No congestion.       Current Medication: Outpatient Encounter Medications as of 08/07/2018  Medication Sig  . Acetaminophen-Codeine (TYLENOL/CODEINE #3) 300-30 MG tablet Take 1 tablet by mouth every 6 (six) hours as needed for pain.  . chlorhexidine (HIBICLENS) 4 % external liquid   . fluticasone (FLONASE) 50 MCG/ACT nasal spray Place 2 sprays into both nostrils daily.  Marland Kitchen ibuprofen (ADVIL,MOTRIN) 800 MG tablet Take 1 tab po three times a day AS NEEDED FOR PAIN  . levocetirizine (XYZAL) 5 MG tablet Take 1 tab po day for allergic rhinitis  . mupirocin ointment (BACTROBAN) 2 % Apply to affected areas twice daily as needed  . nystatin ointment (MYCOSTATIN) Apply 1 application topically 3 (three) times daily.  . phentermine (ADIPEX-P) 37.5 MG tablet Take 1 tablet (37.5 mg total) by mouth daily before breakfast.  . sulfamethoxazole-trimethoprim (BACTRIM DS) 800-160 MG tablet Take 1 tablet by mouth 2 (two) times daily.  . [DISCONTINUED]  Acetaminophen-Codeine (TYLENOL/CODEINE #3) 300-30 MG tablet Take 1 tablet by mouth every 6 (six) hours as needed for pain.  . [DISCONTINUED] phentermine (ADIPEX-P) 37.5 MG tablet Take 1 tablet (37.5 mg total) by mouth daily before breakfast.  . [DISCONTINUED] sulfamethoxazole-trimethoprim (BACTRIM DS,SEPTRA DS) 800-160 MG tablet Take 1 tablet by mouth 2 (two) times daily.   No facility-administered encounter medications on file as of 08/07/2018.     Surgical History: Past Surgical History:  Procedure Laterality Date  . ABDOMINAL HYSTERECTOMY      Medical History: Past Medical History:  Diagnosis Date  . Diabetes mellitus without complication (HCC)     Family History: Family History  Problem Relation Age of Onset  . Diabetes Mother   . Hypertension Mother   . Thyroid disease Mother     Social History   Socioeconomic History  . Marital status: Single    Spouse name: Not on file  . Number of children: Not on file  . Years of education: Not on file  . Highest education level: Not on file  Occupational History  . Not on file  Social Needs  . Financial resource strain: Not on file  . Food insecurity:    Worry: Not on file    Inability: Not on file  . Transportation needs:    Medical: Not on file    Non-medical: Not on file  Tobacco Use  . Smoking status: Never Smoker  . Smokeless tobacco: Never Used  Substance and Sexual Activity  . Alcohol use: No  Frequency: Never  . Drug use: No  . Sexual activity: Never  Lifestyle  . Physical activity:    Days per week: Not on file    Minutes per session: Not on file  . Stress: Not on file  Relationships  . Social connections:    Talks on phone: Not on file    Gets together: Not on file    Attends religious service: Not on file    Active member of club or organization: Not on file    Attends meetings of clubs or organizations: Not on file    Relationship status: Not on file  . Intimate partner violence:    Fear of  current or ex partner: Not on file    Emotionally abused: Not on file    Physically abused: Not on file    Forced sexual activity: Not on file  Other Topics Concern  . Not on file  Social History Narrative  . Not on file      Review of Systems  Constitutional: Negative for activity change, appetite change and fatigue.       Weight gain of 9 pounds since her most recent visit   HENT: Positive for ear discharge and ear pain. Negative for congestion and trouble swallowing.   Respiratory: Negative for chest tightness, shortness of breath and wheezing.   Cardiovascular: Negative for chest pain and palpitations.  Gastrointestinal: Negative for constipation, diarrhea, nausea and vomiting.  Endocrine: Negative for cold intolerance, heat intolerance, polydipsia and polyuria.       Hair thinning.   Musculoskeletal: Positive for arthralgias.       Intermittent knee pain, especially at night. Will generally take tylenol with codeine at night which helps to relieve the pain and enables to rest well.   Skin: Positive for rash.       Skin rash with some areas of breakdown in the folds of the loose abdominal skin.  Allergic/Immunologic: Positive for environmental allergies.  Neurological: Negative for dizziness, weakness, numbness and headaches.  Hematological: Negative for adenopathy.  Psychiatric/Behavioral: Negative for dysphoric mood. The patient is not nervous/anxious.    Today's Vitals   08/07/18 1455  BP: 111/66  Pulse: 62  Resp: 16  Temp: (!) 97.1 F (36.2 C)  SpO2: 100%  Weight: 200 lb 9.6 oz (91 kg)  Height:  (1.676 m)   Body mass index is 32.38 kg/m.  Physical Exam Vitals signs and nursing note reviewed.  Constitutional:      Appearance: [name] well-developed. [name] obese.  HENT:     Head: Normocephalic and atraumatic.     Right Ear: External ear normal.     Left Ear: External ear normal.     Nose: Nose normal.     Mouth/Throat:     Mouth: Mucous membranes are  moist.     Pharynx: Oropharynx is clear.  Eyes:     Conjunctiva/sclera: Conjunctivae normal.     Pupils: Pupils are equal, round, and reactive to light.  Neck:     Musculoskeletal: Normal range of motion and neck supple.     Thyroid: No thyromegaly.  Cardiovascular:     Rate and Rhythm: Normal rate and regular rhythm.     Heart sounds: Normal heart sounds.  Pulmonary:     Effort: Pulmonary effort is normal.     Breath sounds: Normal breath sounds. No wheezing.  Abdominal:     General: Bowel sounds are normal.     Palpations: Abdomen is soft.  Tenderness: There is no abdominal tenderness.  Musculoskeletal:     Right knee: She exhibits decreased range of motion and swelling. Tenderness found.     Left knee: She exhibits decreased range of motion and swelling. Tenderness found.     Comments: Bilateral knee pain, more severe after exertion. There is bruising and swelling over the right patella. ROM and strength of the right knee is currently intact.   Skin:    General: Skin is warm and dry.     Capillary Refill: Capillary refill takes less than 2 seconds.     Findings: Erythema and rash present.     Comments: Red, inflamed rash present in the folds of the lower abdomen. Itchy and sore. A few pustular lesions are present. Red and inflamed. Skin currently intact with no drainage present.   Neurological:     Mental Status: [name] alert and oriented to person, place, and time. Mental status is at baseline.     Comments: Patient is at her neurological baseline.   Psychiatric:        Behavior: Behavior normal.        Thought Content: Thought content normal.        Judgment: Judgment normal.   Assessment/Plan:  1. Cutaneous abscess of abdominal wall Will continue bactrim ds bid for 14 days to treat persistent infection of the skin. Strongly advised patient and her sister to keep upcoming appointment with dermatology.  - sulfamethoxazole-trimethoprim (BACTRIM DS) 800-160 MG tablet; Take  1 tablet by mouth 2 (two) times daily.  Dispense: 28 tablet; Refill: 0  2. Arthrosis of knee May continue to take tylenol with codeine at bedtime as needed for pain. A new prescription was sent to her pharmacy.  - Acetaminophen-Codeine (TYLENOL/CODEINE #3) 300-30 MG tablet; Take 1 tablet by mouth every 6 (six) hours as needed for pain.  Dispense: 30 tablet; Refill: 1  3. Morbid obesity (HCC) Will continue with phentermine for additional 30 days. Explained to patient and her sister that if no weight loss or weight gain in next month, would have to break from phentermine. Both voiced understanding.  - phentermine (ADIPEX-P) 37.5 MG tablet; Take 1 tablet (37.5 mg total) by mouth daily before breakfast.  Dispense: 30 tablet; Refill: 1  General Counseling: Ashyah verbalizes understanding of the findings of todays visit and agrees with plan of treatment. I have discussed any further diagnostic evaluation that may be needed or ordered today. We also reviewed her medications today. she has been encouraged to call the office with any questions or concerns that should arise related to todays visit.   There is a liability release in patients' chart. There has been a 10 minute discussion about the side effects including but not limited to elevated blood pressure, anxiety, lack of sleep and dry mouth. Pt understands and will like to start/continue on appetite suppressant at this time. There will be one month RX given at the time of visit with proper follow up. Nova diet plan with restricted calories is given to the pt. Pt understands and agrees with  plan of treatment  This patient was seen by Vincent GrosHeather Khiry Pasquariello FNP Collaboration with Dr Lyndon CodeFozia M Khan as a part of collaborative care agreement  Meds ordered this encounter  Medications  . Acetaminophen-Codeine (TYLENOL/CODEINE #3) 300-30 MG tablet    Sig: Take 1 tablet by mouth every 6 (six) hours as needed for pain.    Dispense:  30 tablet    Refill:  1  This is not for acute pain. 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    Order Specific Question:   Supervising Provider    Answer:   Lyndon Code [[id number]]  . phentermine (ADIPEX-P) 37.5 MG tablet    Sig: Take 1 tablet (37.5 mg total) by mouth daily before breakfast.    Dispense:  30 tablet    Refill:  1    Order Specific Question:   Supervising Provider    Answer:   Lyndon Code [[id number]]  . sulfamethoxazole-trimethoprim (BACTRIM DS) 800-160 MG tablet    Sig: Take 1 tablet by mouth 2 (two) times daily.    Dispense:  28 tablet    Refill:  0    Order Specific Question:   Supervising Provider    Answer:   Lyndon Code [[id number]]    Time spent: 76 Minutes      Dr Lyndon Code Internal medicine

## 2018-08-28 ENCOUNTER — Other Ambulatory Visit: Payer: Self-pay

## 2018-08-28 MED ORDER — LEVOCETIRIZINE DIHYDROCHLORIDE 5 MG PO TABS: ORAL_TABLET | ORAL | 3 refills | Status: DC

## 2018-09-18 ENCOUNTER — Ambulatory Visit: Payer: Medicaid Other | Admitting: Nurse Practitioner

## 2018-09-18 ENCOUNTER — Other Ambulatory Visit: Payer: Self-pay

## 2018-09-18 ENCOUNTER — Encounter: Payer: Self-pay | Admitting: Nurse Practitioner

## 2018-09-18 DIAGNOSIS — M1710 Unilateral primary osteoarthritis, unspecified knee: Secondary | ICD-10-CM

## 2018-09-18 DIAGNOSIS — L02211 Cutaneous abscess of abdominal wall: Secondary | ICD-10-CM

## 2018-09-18 DIAGNOSIS — B372 Candidiasis of skin and nail: Secondary | ICD-10-CM | POA: Diagnosis not present

## 2018-09-18 DIAGNOSIS — L209 Atopic dermatitis, unspecified: Secondary | ICD-10-CM

## 2018-09-18 MED ORDER — MUPIROCIN 2 % EX OINT: TOPICAL_OINTMENT | CUTANEOUS | 3 refills | Status: AC

## 2018-09-18 MED ORDER — PHENTERMINE HCL 37.5 MG PO TABS: 37.5000 mg | ORAL_TABLET | Freq: Every day | ORAL | 1 refills | Status: DC

## 2018-09-18 MED ORDER — ACETAMINOPHEN-CODEINE 300-30 MG PO TABS: 1.0000 | ORAL_TABLET | Freq: Four times a day (QID) | ORAL | 1 refills | Status: DC | PRN

## 2018-09-18 MED ORDER — NYSTATIN 100000 UNIT/GM EX OINT: 1.0000 "application " | TOPICAL_OINTMENT | Freq: Three times a day (TID) | CUTANEOUS | 3 refills | Status: DC

## 2018-09-18 MED ORDER — SULFAMETHOXAZOLE-TRIMETHOPRIM 800-160 MG PO TABS: 1.0000 | ORAL_TABLET | Freq: Two times a day (BID) | ORAL | 0 refills | Status: DC

## 2018-09-18 NOTE — Progress Notes (Signed)
[facility] [address] [place], [place] [id number]  Internal MEDICINE  Office Visit Note  Patient Name: Whitney Brandt  [id number] 31, 1992  [id number]  Date of Service: 09/18/2018  Chief Complaint  Patient presents with  . Medical Management of Chronic Issues    weight loss management     The patient is here for follow up of weight management. Has been taking phentermine for some time. She has lost nearly 130 pounds. Last month, she did have first period of weight gain. This month, she is back on the right track. Has lost three pounds. [name] dong well without negative side effects. The patient continues to have trouble with her skin in the folds of her abdomen, especially since she has been able to successfully lose weight. She constantly has rash which sometimes develops boils and will break down. These areas get very irritated and tender and will cause localized infection. She cleans skin every day with hibiclens. Uses prescribed powders and creams and these are not helping any longer. A referral has been made to dermatology for this issue. She does have initial appointment with dermatology next month. Was supposed to be earlier this month, however there was mix up with office schedule.       Current Medication: Outpatient Encounter Medications as of 09/18/2018  Medication Sig  . Acetaminophen-Codeine (TYLENOL/CODEINE #3) 300-30 MG tablet Take 1 tablet by mouth every 6 (six) hours as needed for pain.  . chlorhexidine (HIBICLENS) 4 % external liquid   . fluticasone (FLONASE) 50 MCG/ACT nasal spray Place 2 sprays into both nostrils daily.  Marland Kitchen. ibuprofen (ADVIL,MOTRIN) 800 MG tablet Take 1 tab po three times a day AS NEEDED FOR PAIN  . levocetirizine (XYZAL) 5 MG tablet Take 1 tab po day for allergic rhinitis  . mupirocin ointment (BACTROBAN) 2 % Apply to affected areas twice daily as needed  . nystatin ointment (MYCOSTATIN) Apply 1 application topically 3 (three) times daily.   . phentermine (ADIPEX-P) 37.5 MG tablet Take 1 tablet (37.5 mg total) by mouth daily before breakfast.  . sulfamethoxazole-trimethoprim (BACTRIM DS) 800-160 MG tablet Take 1 tablet by mouth 2 (two) times daily.  . [DISCONTINUED] Acetaminophen-Codeine (TYLENOL/CODEINE #3) 300-30 MG tablet Take 1 tablet by mouth every 6 (six) hours as needed for pain.  . [DISCONTINUED] mupirocin ointment (BACTROBAN) 2 % Apply to affected areas twice daily as needed  . [DISCONTINUED] nystatin ointment (MYCOSTATIN) Apply 1 application topically 3 (three) times daily.  . [DISCONTINUED] phentermine (ADIPEX-P) 37.5 MG tablet Take 1 tablet (37.5 mg total) by mouth daily before breakfast.  . [DISCONTINUED] sulfamethoxazole-trimethoprim (BACTRIM DS) 800-160 MG tablet Take 1 tablet by mouth 2 (two) times daily.   No facility-administered encounter medications on file as of 09/18/2018.     Surgical History: Past Surgical History:  Procedure Laterality Date  . ABDOMINAL HYSTERECTOMY      Medical History: Past Medical History:  Diagnosis Date  . Diabetes mellitus without complication (HCC)     Family History: Family History  Problem Relation Age of Onset  . Diabetes Mother   . Hypertension Mother   . Thyroid disease Mother     Social History   Socioeconomic History  . Marital status: Single    Spouse name: Not on file  . Number of children: Not on file  . Years of education: Not on file  . Highest education level: Not on file  Occupational History  . Not on file  Social Needs  . Physicist, medicalinancial resource  strain: Not on file  . Food insecurity    Worry: Not on file    Inability: Not on file  . Transportation needs    Medical: Not on file    Non-medical: Not on file  Tobacco Use  . Smoking status: Never Smoker  . Smokeless tobacco: Never Used  Substance and Sexual Activity  . Alcohol use: No    Frequency: Never  . Drug use: No  . Sexual activity: Never  Lifestyle  . Physical activity    Days per  week: Not on file    Minutes per session: Not on file  . Stress: Not on file  Relationships  . Social Musicianconnections    Talks on phone: Not on file    Gets together: Not on file    Attends religious service: Not on file    Active member of club or organization: Not on file    Attends meetings of clubs or organizations: Not on file    Relationship status: Not on file  . Intimate partner violence    Fear of current or ex partner: Not on file    Emotionally abused: Not on file    Physically abused: Not on file    Forced sexual activity: Not on file  Other Topics Concern  . Not on file  Social History Narrative  . Not on file      Review of Systems  Constitutional: Negative for activity change, appetite change and fatigue.       Weight loss of three pounds since her last visit.   HENT: Negative for congestion, ear discharge, ear pain and trouble swallowing.   Respiratory: Negative for chest tightness, shortness of breath and wheezing.   Cardiovascular: Negative for chest pain and palpitations.  Gastrointestinal: Negative for constipation, diarrhea, nausea and vomiting.  Endocrine: Negative for cold intolerance, heat intolerance, polydipsia and polyuria.       Hair thinning.   Musculoskeletal: Positive for arthralgias.       Intermittent knee pain, especially at night. Will generally take tylenol with codeine at night which helps to relieve the pain and enables to rest well.   Skin: Positive for rash.       Skin rash with some areas of breakdown in the folds of the loose abdominal skin.  Allergic/Immunologic: Positive for environmental allergies.  Neurological: Negative for dizziness, weakness, numbness and headaches.  Hematological: Negative for adenopathy.  Psychiatric/Behavioral: Negative for dysphoric mood. The patient is not nervous/anxious.     Today's Vitals   09/18/18 1338  BP: 115/83  Pulse: 78  Resp: 16  SpO2: 100%  Weight: 197 lb (89.4 kg)  Height: 5\' 7"  (1.702 m)    Body mass index is 30.85 kg/m.  Physical Exam Vitals signs and nursing note reviewed.  Constitutional:      Appearance: [name] well-developed. [name] obese.  HENT:     Head: Normocephalic and atraumatic.     Mouth/Throat:     Pharynx: Oropharynx is clear.  Eyes:     Conjunctiva/sclera: Conjunctivae normal.     Pupils: Pupils are equal, round, and reactive to light.  Neck:     Musculoskeletal: Normal range of motion and neck supple.     Thyroid: No thyromegaly.  Cardiovascular:     Rate and Rhythm: Normal rate and regular rhythm.     Heart sounds: Normal heart sounds.  Pulmonary:     Effort: Pulmonary effort is normal.     Breath sounds: Normal breath sounds.  No wheezing.  Abdominal:     General: Bowel sounds are normal.     Palpations: Abdomen is soft.     Tenderness: There is no abdominal tenderness.  Musculoskeletal:     Right knee: She exhibits decreased range of motion and swelling. Tenderness found.     Left knee: She exhibits decreased range of motion and swelling. Tenderness found.     Comments: Bilateral knee pain, more severe after exertion. There is bruising and swelling over the right patella. ROM and strength of the right knee is currently intact.   Skin:    General: Skin is warm and dry.     Capillary Refill: Capillary refill takes less than 2 seconds.     Findings: Erythema and rash present.     Comments: Red, inflamed rash present in the folds of the lower abdomen. Itchy and sore. A few pustular lesions are present. Red and inflamed. Skin currently intact with no drainage present.   Neurological:     Mental Status: [name] alert and oriented to person, place, and time. Mental status is at baseline.     Comments: Patient is at her neurological baseline.   Psychiatric:        Behavior: Behavior normal.        Thought Content: Thought content normal.        Judgment: Judgment normal.   Assessment/Plan:  1. Cutaneous abscess of abdominal wall Will continue  bactrim DS bid for 3 weeks which will take her until new patient appointment with dermatology.  - sulfamethoxazole-trimethoprim (BACTRIM DS) 800-160 MG tablet; Take 1 tablet by mouth 2 (two) times daily.  Dispense: 42 tablet; Refill: 0  2. Atopic dermatitis, unspecified type bactroban ointment should be applied bid to affected areas.  - mupirocin ointment (BACTROBAN) 2 %; Apply to affected areas twice daily as needed  Dispense: 44 g; Refill: 3  3. Cutaneous candidiasis Nystatin ointment may be applied three times daily to affected areas.  - nystatin ointment (MYCOSTATIN); Apply 1 application topically 3 (three) times daily.  Dispense: 90 g; Refill: 3  4. Arthrosis of knee May take tylenol #3 at bedtime as needed for knee pain. New prescription sent to her pharmacy today.  - Acetaminophen-Codeine (TYLENOL/CODEINE #3) 300-30 MG tablet; Take 1 tablet by mouth every 6 (six) hours as needed for pain.  Dispense: 30 tablet; Refill: 1  5. Morbid obesity ([place]) Improving. May continue phentermine 37.5mg  daily. Limit calorie intake to 1500 calories per day. Exercise regularly.  - phentermine (ADIPEX-P) 37.5 MG tablet; Take 1 tablet (37.5 mg total) by mouth daily before breakfast.  Dispense: 30 tablet; Refill: 1  General Counseling: Avelynn verbalizes understanding of the findings of todays visit and agrees with plan of treatment. I have discussed any further diagnostic evaluation that may be needed or ordered today. We also reviewed her medications today. she has been encouraged to call the office with any questions or concerns that should arise related to todays visit.   There is a liability release in patients' chart. There has been a 10 minute discussion about the side effects including but not limited to elevated blood pressure, anxiety, lack of sleep and dry mouth. Pt understands and will like to start/continue on appetite suppressant at this time. There will be one month RX given at the time of  visit with proper follow up. Nova diet plan with restricted calories is given to the pt. Pt understands and agrees with  plan of treatment  This patient was  seen by Vincent GrosHeather Nature Vogelsang FNP Collaboration with Dr Lyndon CodeFozia M Khan as a part of collaborative care agreement  Meds ordered this encounter  Medications  . Acetaminophen-Codeine (TYLENOL/CODEINE #3) 300-30 MG tablet    Sig: Take 1 tablet by mouth every 6 (six) hours as needed for pain.    Dispense:  30 tablet    Refill:  1    This is not for acute pain. 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    Order Specific Question:   Supervising Provider    Answer:   Lyndon CodeKHAN, FOZIA M [[id number]]  . phentermine (ADIPEX-P) 37.5 MG tablet    Sig: Take 1 tablet (37.5 mg total) by mouth daily before breakfast.    Dispense:  30 tablet    Refill:  1    Order Specific Question:   Supervising Provider    Answer:   Lyndon CodeKHAN, FOZIA M [[id number]]  . sulfamethoxazole-trimethoprim (BACTRIM DS) 800-160 MG tablet    Sig: Take 1 tablet by mouth 2 (two) times daily.    Dispense:  42 tablet    Refill:  0    Order Specific Question:   Supervising Provider    Answer:   Lyndon CodeKHAN, FOZIA M [[id number]]  . mupirocin ointment (BACTROBAN) 2 %    Sig: Apply to affected areas twice daily as needed    Dispense:  44 g    Refill:  3    Order Specific Question:   Supervising Provider    Answer:   Lyndon CodeKHAN, FOZIA M [[id number]]  . nystatin ointment (MYCOSTATIN)    Sig: Apply 1 application topically 3 (three) times daily.    Dispense:  90 g    Refill:  3    Order Specific Question:   Supervising Provider    Answer:   Lyndon CodeKHAN, FOZIA M [[id number]]    Time spent: 6425 Minutes      Dr Lyndon CodeFozia M Khan Internal medicine

## 2018-10-29 ENCOUNTER — Ambulatory Visit: Payer: Medicaid Other | Admitting: Nurse Practitioner

## 2018-10-29 ENCOUNTER — Other Ambulatory Visit: Payer: Self-pay

## 2018-10-29 ENCOUNTER — Encounter: Payer: Self-pay | Admitting: Nurse Practitioner

## 2018-10-29 VITALS — BP 112/72 | HR 70 | Resp 16 | Ht 67.0 in | Wt 195.0 lb

## 2018-10-29 DIAGNOSIS — B372 Candidiasis of skin and nail: Secondary | ICD-10-CM | POA: Diagnosis not present

## 2018-10-29 DIAGNOSIS — M1710 Unilateral primary osteoarthritis, unspecified knee: Secondary | ICD-10-CM

## 2018-10-29 DIAGNOSIS — L209 Atopic dermatitis, unspecified: Secondary | ICD-10-CM

## 2018-10-29 DIAGNOSIS — L02211 Cutaneous abscess of abdominal wall: Secondary | ICD-10-CM | POA: Diagnosis not present

## 2018-10-29 MED ORDER — SULFAMETHOXAZOLE-TRIMETHOPRIM 800-160 MG PO TABS: 1.0000 | ORAL_TABLET | Freq: Two times a day (BID) | ORAL | 0 refills | Status: DC

## 2018-10-29 MED ORDER — ACETAMINOPHEN-CODEINE 300-30 MG PO TABS: 1.0000 | ORAL_TABLET | Freq: Four times a day (QID) | ORAL | 1 refills | Status: DC | PRN

## 2018-10-29 MED ORDER — PHENTERMINE HCL 37.5 MG PO TABS: 37.5000 mg | ORAL_TABLET | Freq: Every day | ORAL | 1 refills | Status: DC

## 2018-10-29 NOTE — Progress Notes (Signed)
[facility] [address] [place], [place] [address]  Internal MEDICINE  Office Visit Note  Patient Name: Whitney Brandt  [id number]-May-1992  [id number]  Date of Service: 10/29/2018  Chief Complaint  Patient presents with  . Medical Management of Chronic Issues    weight management  . Recurrent Skin Infections  . Allergic Rhinitis     The patient is here for follow up of weight management. [name] currently taking phentermine 37.5mg  tablets daily. Has lost 2 pounds since her last visit. She has no negative side effects associated with taking this medication.  The patient continues to have trouble with her skin in the folds of her abdomen, especially since she has been able to successfully lose weight. She constantly has rash which sometimes develops boils and will break down. These areas get very irritated and tender and will cause localized infection. She cleans skin every day with hibiclens. Uses prescribed powders and creams and these are not helping any longer. [facility] for [facility] issue, however, dermatology office has cancelled and rescheduled appointment so many times due to COVID 19, the next available visit is not until December. She and her sister would like to have referral to new dermatologist.        Current Medication: Outpatient Encounter Medications as of 10/29/2018  Medication Sig  . Acetaminophen-Codeine (TYLENOL/CODEINE #3) 300-30 MG tablet Take 1 tablet by mouth every 6 (six) hours as needed for pain.  . chlorhexidine (HIBICLENS) 4 % external liquid   . fluticasone (FLONASE) 50 MCG/ACT nasal spray Place 2 sprays into both nostrils daily.  Marland Kitchen. ibuprofen (ADVIL,MOTRIN) 800 MG tablet Take 1 tab po three times a day AS NEEDED FOR PAIN  . levocetirizine (XYZAL) 5 MG tablet Take 1 tab po day for allergic rhinitis  . mupirocin ointment (BACTROBAN) 2 % Apply to affected areas twice daily as needed  . nystatin ointment  (MYCOSTATIN) Apply 1 application topically 3 (three) times daily.  . phentermine (ADIPEX-P) 37.5 MG tablet Take 1 tablet (37.5 mg total) by mouth daily before breakfast.  . sulfamethoxazole-trimethoprim (BACTRIM DS) 800-160 MG tablet Take 1 tablet by mouth 2 (two) times daily.  . [DISCONTINUED] Acetaminophen-Codeine (TYLENOL/CODEINE #3) 300-30 MG tablet Take 1 tablet by mouth every 6 (six) hours as needed for pain.  . [DISCONTINUED] phentermine (ADIPEX-P) 37.5 MG tablet Take 1 tablet (37.5 mg total) by mouth daily before breakfast.  . [DISCONTINUED] sulfamethoxazole-trimethoprim (BACTRIM DS) 800-160 MG tablet Take 1 tablet by mouth 2 (two) times daily.   No facility-administered encounter medications on file as of 10/29/2018.     Surgical History: Past Surgical History:  Procedure Laterality Date  . ABDOMINAL HYSTERECTOMY      Medical History: Past Medical History:  Diagnosis Date  . Diabetes mellitus without complication (HCC)     Family History: Family History  Problem Relation Age of Onset  . Diabetes Mother   . Hypertension Mother   . Thyroid disease Mother     Social History   Socioeconomic History  . Marital status: Single    Spouse name: Not on file  . Number of children: Not on file  . Years of education: Not on file  . Highest education level: Not on file  Occupational History  . Not on file  Social Needs  . Financial resource strain: Not on file  . Food insecurity    Worry: Not on file    Inability: Not on file  . Transportation  needs    Medical: Not on file    Non-medical: Not on file  Tobacco Use  . Smoking status: Never Smoker  . Smokeless tobacco: Never Used  Substance and Sexual Activity  . Alcohol use: No    Frequency: Never  . Drug use: No  . Sexual activity: Never  Lifestyle  . Physical activity    Days per week: Not on file    Minutes per session: Not on file  . Stress: Not on file  Relationships  . Social Musicianconnections    Talks on phone:  Not on file    Gets together: Not on file    Attends religious service: Not on file    Active member of club or organization: Not on file    Attends meetings of clubs or organizations: Not on file    Relationship status: Not on file  . Intimate partner violence    Fear of current or ex partner: Not on file    Emotionally abused: Not on file    Physically abused: Not on file    Forced sexual activity: Not on file  Other Topics Concern  . Not on file  Social History Narrative  . Not on file      Review of Systems  Constitutional: Negative for activity change, appetite change and fatigue.       Weight loss of two pounds since her last visit.   HENT: Negative for congestion, ear discharge, ear pain and trouble swallowing.   Respiratory: Negative for chest tightness, shortness of breath and wheezing.   Cardiovascular: Negative for chest pain and palpitations.  Gastrointestinal: Negative for constipation, diarrhea, nausea and vomiting.  Endocrine: Negative for cold intolerance, heat intolerance, polydipsia and polyuria.  Musculoskeletal: Positive for arthralgias.       Intermittent knee pain, especially at night. Will generally take tylenol with codeine at night which helps to relieve the pain and enables to rest well.   Skin: Positive for rash.       Skin rash with some areas of breakdown in the folds of the loose abdominal skin.  Allergic/Immunologic: Positive for environmental allergies.  Neurological: Negative for dizziness, weakness, numbness and headaches.  Hematological: Negative for adenopathy.  Psychiatric/Behavioral: Negative for dysphoric mood. The patient is not nervous/anxious.     Today's Vitals   10/29/18 1346  BP: 112/72  Pulse: 70  Resp: 16  SpO2: 99%  Weight: 195 lb (88.5 kg)  Height: 5\' 7"  (1.702 m)   Body mass index is 30.54 kg/m.  Physical Exam Vitals signs and nursing note reviewed.  Constitutional:      Appearance: [name] well-developed. [name]  obese.  HENT:     Head: Normocephalic and atraumatic.     Mouth/Throat:     Pharynx: Oropharynx is clear.  Eyes:     Conjunctiva/sclera: Conjunctivae normal.     Pupils: Pupils are equal, round, and reactive to light.  Neck:     Musculoskeletal: Normal range of motion and neck supple.     Thyroid: No thyromegaly.  Cardiovascular:     Rate and Rhythm: Normal rate and regular rhythm.     Heart sounds: Normal heart sounds.  Pulmonary:     Effort: Pulmonary effort is normal.     Breath sounds: Normal breath sounds. No wheezing.  Abdominal:     General: Bowel sounds are normal.     Palpations: Abdomen is soft.     Tenderness: There is no abdominal tenderness.  Musculoskeletal:  Right knee: She exhibits decreased range of motion and swelling. Tenderness found.     Left knee: She exhibits decreased range of motion and swelling. Tenderness found.     Comments: Bilateral knee pain, more severe after exertion. There is bruising and swelling over the right patella. ROM and strength of the right knee is currently intact.   Skin:    General: Skin is warm and dry.     Capillary Refill: Capillary refill takes less than 2 seconds.     Findings: Erythema and rash present.     Comments: Red, inflamed rash present in the folds of the lower abdomen. Itchy and sore. A few pustular lesions are present. Red and inflamed. Skin currently intact with no drainage present.   Neurological:     Mental Status: [name] alert and oriented to person, place, and time. Mental status is at baseline.     Comments: Patient is at her neurological baseline.   Psychiatric:        Behavior: Behavior normal.        Thought Content: Thought content normal.        Judgment: Judgment normal.   Assessment/Plan: 1. Cutaneous abscess of abdominal wall continue with bactrim DS bid for6 weeks. Will get new referral to dermatology for further evaluation and treatment  - Ambulatory referral to Dermatology -  sulfamethoxazole-trimethoprim (BACTRIM DS) 800-160 MG tablet; Take 1 tablet by mouth 2 (two) times daily.  Dispense: 42 tablet; Refill: 0  2. Atopic dermatitis, unspecified type Continue topical treatments as prescribed. New referral to dermatology today. - Ambulatory referral to Dermatology  3. Cutaneous candidiasis Continue topical treatments as prescribed. New referral to dermatology today. - Ambulatory referral to Dermatology  4. Arthrosis of knee May take tylenol #3 at bedtime as needed for moderate to severe pain. Renewed prescription and sent to pharmacy.  - Acetaminophen-Codeine (TYLENOL/CODEINE #3) 300-30 MG tablet; Take 1 tablet by mouth every 6 (six) hours as needed for pain.  Dispense: 30 tablet; Refill: 1  5. Morbid obesity ([place]) May continue phentermine daily. Limit calorie intake to 1200-1500 calories per day and continue to incorporate exercise into daily routine.  - phentermine (ADIPEX-P) 37.5 MG tablet; Take 1 tablet (37.5 mg total) by mouth daily before breakfast.  Dispense: 30 tablet; Refill: 1  General Counseling: Emaleigh verbalizes understanding of the findings of todays visit and agrees with plan of treatment. I have discussed any further diagnostic evaluation that may be needed or ordered today. We also reviewed her medications today. she has been encouraged to call the office with any questions or concerns that should arise related to todays visit.   There is a liability release in patients' chart. There has been a 10 minute discussion about the side effects including but not limited to elevated blood pressure, anxiety, lack of sleep and dry mouth. Pt understands and will like to start/continue on appetite suppressant at this time. There will be one month RX given at the time of visit with proper follow up. Nova diet plan with restricted calories is given to the pt. Pt understands and agrees with  plan of treatment  This patient was seen by Leretha Pol FNP  Collaboration with Dr Lavera Guise as a part of collaborative care agreement  Orders Placed This Encounter  Procedures  . Ambulatory referral to Dermatology    Meds ordered this encounter  Medications  . Acetaminophen-Codeine (TYLENOL/CODEINE #3) 300-30 MG tablet    Sig: Take 1 tablet by mouth every  6 (six) hours as needed for pain.    Dispense:  30 tablet    Refill:  1    This is not for acute pain. 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    Order Specific Question:   Supervising Provider    Answer:   Lyndon CodeKHAN, FOZIA M [[id number]]  . phentermine (ADIPEX-P) 37.5 MG tablet    Sig: Take 1 tablet (37.5 mg total) by mouth daily before breakfast.    Dispense:  30 tablet    Refill:  1    Order Specific Question:   Supervising Provider    Answer:   Lyndon CodeKHAN, FOZIA M [[id number]]  . sulfamethoxazole-trimethoprim (BACTRIM DS) 800-160 MG tablet    Sig: Take 1 tablet by mouth 2 (two) times daily.    Dispense:  42 tablet    Refill:  0    Order Specific Question:   Supervising Provider    Answer:   Lyndon CodeKHAN, FOZIA M [[id number]]    Time spent: 2225 Minutes      Dr Lyndon CodeFozia M Khan Internal medicine

## 2018-10-30 ENCOUNTER — Other Ambulatory Visit: Payer: Self-pay

## 2018-12-11 ENCOUNTER — Ambulatory Visit: Payer: Medicaid Other | Admitting: Nurse Practitioner

## 2018-12-11 ENCOUNTER — Encounter: Payer: Self-pay | Admitting: Nurse Practitioner

## 2018-12-11 ENCOUNTER — Other Ambulatory Visit: Payer: Self-pay

## 2018-12-11 VITALS — BP 116/76 | HR 62 | Resp 16 | Ht 67.0 in | Wt 192.6 lb

## 2018-12-11 DIAGNOSIS — B372 Candidiasis of skin and nail: Secondary | ICD-10-CM | POA: Diagnosis not present

## 2018-12-11 DIAGNOSIS — M1710 Unilateral primary osteoarthritis, unspecified knee: Secondary | ICD-10-CM | POA: Diagnosis not present

## 2018-12-11 DIAGNOSIS — L02211 Cutaneous abscess of abdominal wall: Secondary | ICD-10-CM | POA: Diagnosis not present

## 2018-12-11 MED ORDER — SULFAMETHOXAZOLE-TRIMETHOPRIM 800-160 MG PO TABS: 1.0000 | ORAL_TABLET | Freq: Two times a day (BID) | ORAL | 0 refills | Status: DC

## 2018-12-11 MED ORDER — NYSTATIN 100000 UNIT/GM EX POWD: Freq: Four times a day (QID) | CUTANEOUS | 2 refills | Status: DC

## 2018-12-11 MED ORDER — PHENTERMINE HCL 37.5 MG PO TABS: 37.5000 mg | ORAL_TABLET | Freq: Every day | ORAL | 1 refills | Status: DC

## 2018-12-11 MED ORDER — ACETAMINOPHEN-CODEINE 300-30 MG PO TABS: 1.0000 | ORAL_TABLET | Freq: Four times a day (QID) | ORAL | 1 refills | Status: DC | PRN

## 2018-12-11 NOTE — Progress Notes (Signed)
[facility] [address] [place], [place] [id number]  Internal MEDICINE  Office Visit Note  Patient Name: Whitney Brandt  [id number]-Feb-1992  [id number]  Date of Service: 12/11/2018  Chief Complaint  Patient presents with  . Follow-up    weight management    The patient is here for follow up wight management. Continues to take phentermine. Lost additional three pounds since she was last seen. She has lost nearly 130 pounds since starting weight loss journey. She has no concerns or complaints from taking this medication. he patient continues to have trouble with her skin in the folds of her abdomen, especially since she has been able to successfully lose weight. She constantly has rash which sometimes develops boils and will break down. These areas get very irritated and tender and will cause localized infection. She cleans skin every day with hibiclens. Uses prescribed powders and creams and these are not helping any longer. Did refer to dermatology for this. They feel as though patient needs to be seen per plastic surgery as this skin breakdown is an issue from the weight loss. In order to prevent further issues, skin folds might need to be surgically removed.       Current Medication: Outpatient Encounter Medications as of 12/11/2018  Medication Sig  . Acetaminophen-Codeine (TYLENOL/CODEINE #3) 300-30 MG tablet Take 1 tablet by mouth every 6 (six) hours as needed for pain.  . chlorhexidine (HIBICLENS) 4 % external liquid   . fluticasone (FLONASE) 50 MCG/ACT nasal spray Place 2 sprays into both nostrils daily.  Marland Kitchen. ibuprofen (ADVIL,MOTRIN) 800 MG tablet Take 1 tab po three times a day AS NEEDED FOR PAIN  . levocetirizine (XYZAL) 5 MG tablet Take 1 tab po day for allergic rhinitis  . mupirocin ointment (BACTROBAN) 2 % Apply to affected areas twice daily as needed  . nystatin (MYCOSTATIN/NYSTOP) powder Apply topically 4 (four) times daily.  Marland Kitchen. nystatin ointment (MYCOSTATIN) Apply 1  application topically 3 (three) times daily.  . phentermine (ADIPEX-P) 37.5 MG tablet Take 1 tablet (37.5 mg total) by mouth daily before breakfast.  . sulfamethoxazole-trimethoprim (BACTRIM DS) 800-160 MG tablet Take 1 tablet by mouth 2 (two) times daily.  . [DISCONTINUED] Acetaminophen-Codeine (TYLENOL/CODEINE #3) 300-30 MG tablet Take 1 tablet by mouth every 6 (six) hours as needed for pain.  . [DISCONTINUED] phentermine (ADIPEX-P) 37.5 MG tablet Take 1 tablet (37.5 mg total) by mouth daily before breakfast.  . [DISCONTINUED] sulfamethoxazole-trimethoprim (BACTRIM DS) 800-160 MG tablet Take 1 tablet by mouth 2 (two) times daily.   No facility-administered encounter medications on file as of 12/11/2018.     Surgical History: Past Surgical History:  Procedure Laterality Date  . ABDOMINAL HYSTERECTOMY      Medical History: Past Medical History:  Diagnosis Date  . Diabetes mellitus without complication (HCC)     Family History: Family History  Problem Relation Age of Onset  . Diabetes Mother   . Hypertension Mother   . Thyroid disease Mother     Social History   Socioeconomic History  . Marital status: Single    Spouse name: Not on file  . Number of children: Not on file  . Years of education: Not on file  . Highest education level: Not on file  Occupational History  . Not on file  Social Needs  . Financial resource strain: Not on file  . Food insecurity    Worry: Not on file    Inability: Not on file  . Transportation needs  Medical: Not on file    Non-medical: Not on file  Tobacco Use  . Smoking status: Never Smoker  . Smokeless tobacco: Never Used  Substance and Sexual Activity  . Alcohol use: No    Frequency: Never  . Drug use: No  . Sexual activity: Never  Lifestyle  . Physical activity    Days per week: Not on file    Minutes per session: Not on file  . Stress: Not on file  Relationships  . Social Musicianconnections    Talks on phone: Not on file    Gets  together: Not on file    Attends religious service: Not on file    Active member of club or organization: Not on file    Attends meetings of clubs or organizations: Not on file    Relationship status: Not on file  . Intimate partner violence    Fear of current or ex partner: Not on file    Emotionally abused: Not on file    Physically abused: Not on file    Forced sexual activity: Not on file  Other Topics Concern  . Not on file  Social History Narrative  . Not on file      Review of Systems  Constitutional: Negative for activity change, appetite change and fatigue.       Weight loss of three pounds since her last visit.   HENT: Negative for congestion, ear discharge, ear pain and trouble swallowing.   Respiratory: Negative for chest tightness, shortness of breath and wheezing.   Cardiovascular: Negative for chest pain and palpitations.  Gastrointestinal: Negative for constipation, diarrhea, nausea and vomiting.  Endocrine: Negative for cold intolerance, heat intolerance, polydipsia and polyuria.  Musculoskeletal: Positive for arthralgias.       Intermittent knee pain, especially at night. Will generally take tylenol with codeine at night which helps to relieve the pain and enables to rest well.   Skin: Positive for rash.       Skin rash with some areas of breakdown in the folds of the loose abdominal skin.  Allergic/Immunologic: Positive for environmental allergies.  Neurological: Negative for dizziness, weakness, numbness and headaches.  Hematological: Negative for adenopathy.  Psychiatric/Behavioral: Negative for dysphoric mood. The patient is not nervous/anxious.    Today's Vitals   12/11/18 1419  BP: 116/76  Pulse: 62  Resp: 16  SpO2: 99%  Weight: 192 lb 9.6 oz (87.4 kg)  Height: 5\' 7"  (1.702 m)   Body mass index is 30.17 kg/m.  Physical Exam Vitals signs and nursing note reviewed.  Constitutional:      Appearance: [name] well-developed. [name] obese.  HENT:      Head: Normocephalic and atraumatic.     Mouth/Throat:     Pharynx: Oropharynx is clear.  Eyes:     Conjunctiva/sclera: Conjunctivae normal.     Pupils: Pupils are equal, round, and reactive to light.  Neck:     Musculoskeletal: Normal range of motion and neck supple.     Thyroid: No thyromegaly.  Cardiovascular:     Rate and Rhythm: Normal rate and regular rhythm.     Heart sounds: Normal heart sounds.  Pulmonary:     Effort: Pulmonary effort is normal.     Breath sounds: Normal breath sounds. No wheezing.  Abdominal:     General: Bowel sounds are normal.     Palpations: Abdomen is soft.     Tenderness: There is no abdominal tenderness.  Musculoskeletal:  Right knee: She exhibits decreased range of motion and swelling. Tenderness found.     Left knee: She exhibits decreased range of motion and swelling. Tenderness found.     Comments: Bilateral knee pain, more severe after exertion. There is bruising and swelling over the right patella. ROM and strength of the right knee is currently intact.   Skin:    General: Skin is warm and dry.     Capillary Refill: Capillary refill takes less than 2 seconds.     Findings: Erythema and rash present.     Comments: Red, inflamed rash present in the folds of the lower abdomen. Itchy and sore. A few pustular lesions are present. Red and inflamed. Skin currently intact with no drainage present.   Neurological:     Mental Status: [name] alert and oriented to person, place, and time. Mental status is at baseline.     Comments: Patient is at her neurological baseline.   Psychiatric:        Behavior: Behavior normal.        Thought Content: Thought content normal.        Judgment: Judgment normal.   Assessment/Plan: 1. Cutaneous abscess of abdominal wall Repeat round bactrim DS bid for 3 weeks, continue antibacterial ointment twice daily as indicated. Refer to plastic surgery for further evaluation and treatment. -  sulfamethoxazole-trimethoprim (BACTRIM DS) 800-160 MG tablet; Take 1 tablet by mouth 2 (two) times daily.  Dispense: 42 tablet; Refill: 0 - Ambulatory referral to Plastic Surgery  2. Cutaneous candidiasis Add nystatin powder. Apply to affected areas three to four times daily as needed. Continue to use nystatin ointment as indicated. Refer to plastic surgery for further evaluation and treatment. - nystatin (MYCOSTATIN/NYSTOP) powder; Apply topically 4 (four) times daily.  Dispense: 60 g; Refill: 2 - Ambulatory referral to Plastic Surgery  3. Arthrosis of knee May take tylenol #3 which should be taken at night if needed for pain in knee, not relieved by ibuprofen.  - Acetaminophen-Codeine (TYLENOL/CODEINE #3) 300-30 MG tablet; Take 1 tablet by mouth every 6 (six) hours as needed for pain.  Dispense: 30 tablet; Refill: 1  4. Morbid obesity ([place]) Continues to improve. May continue phentermine daily. Continue with low calorie diet and regular exercise.  - phentermine (ADIPEX-P) 37.5 MG tablet; Take 1 tablet (37.5 mg total) by mouth daily before breakfast.  Dispense: 30 tablet; Refill: 1  General Counseling: Anida verbalizes understanding of the findings of todays visit and agrees with plan of treatment. I have discussed any further diagnostic evaluation that may be needed or ordered today. We also reviewed her medications today. she has been encouraged to call the office with any questions or concerns that should arise related to todays visit.   There is a liability release in patients' chart. There has been a 10 minute discussion about the side effects including but not limited to elevated blood pressure, anxiety, lack of sleep and dry mouth. Pt understands and will like to start/continue on appetite suppressant at this time. There will be one month RX given at the time of visit with proper follow up. Nova diet plan with restricted calories is given to the pt. Pt understands and agrees with  plan  of treatment  This patient was seen by Leretha Pol FNP Collaboration with Dr Lavera Guise as a part of collaborative care agreement  Orders Placed This Encounter  Procedures  . Ambulatory referral to Plastic Surgery    Meds ordered this encounter  Medications  . Acetaminophen-Codeine (TYLENOL/CODEINE #3) 300-30 MG tablet    Sig: Take 1 tablet by mouth every 6 (six) hours as needed for pain.    Dispense:  30 tablet    Refill:  1    This is not for acute pain. 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    Order Specific Question:   Supervising Provider    Answer:   Lyndon Code [[id number]]  . phentermine (ADIPEX-P) 37.5 MG tablet    Sig: Take 1 tablet (37.5 mg total) by mouth daily before breakfast.    Dispense:  30 tablet    Refill:  1    Order Specific Question:   Supervising Provider    Answer:   Lyndon Code [[id number]]  . sulfamethoxazole-trimethoprim (BACTRIM DS) 800-160 MG tablet    Sig: Take 1 tablet by mouth 2 (two) times daily.    Dispense:  42 tablet    Refill:  0    Order Specific Question:   Supervising Provider    Answer:   Lyndon Code [[id number]]  . nystatin (MYCOSTATIN/NYSTOP) powder    Sig: Apply topically 4 (four) times daily.    Dispense:  60 g    Refill:  2    Order Specific Question:   Supervising Provider    Answer:   Lyndon Code [[id number]]    Time spent: 41 Minutes      Dr Lyndon Code Internal medicine

## 2018-12-12 ENCOUNTER — Telehealth: Payer: Self-pay | Admitting: *Deleted

## 2018-12-12 NOTE — Telephone Encounter (Signed)
-----   Message from Dominga [place], [place] sent at 12/12/2018  1:42 PM EDT ----- Regarding: RE: [facility] appointment  I have called patient but had to leave a message.  ----- Message ----- From: Fredirick Maudlin, MD Sent: 12/12/2018   1:29 PM EDT To: [facility] Clinical Pool Subject: [facility] appointment             This patient was supposed to be scheduled with plastic surgery, but somehow was put in my clinic. I have confirmed with Dr. Dahlia Byes that the plastic surgery referral is appropriate and that her appointment with me should be cancelled. Please facilitate getting her scheduled with plastic surgery.  Thank you.  Bearl Mulberry

## 2018-12-12 NOTE — Telephone Encounter (Signed)
I talked with patient mom about doing a referral to Dr. Marla Roe office. Mom understands that Dr. Marla Roe office will be calling  them for an apportionment.

## 2018-12-12 NOTE — Telephone Encounter (Signed)
Message left on home number for patient to call the office. No answer on cell number and not able to leave a message.   Per Dr. Celine Ahr, patient needs to be seen by plastic surgery (not general surgery-this has been okayed per Dr. Dahlia Byes).  Appointment scheduled for 12-20-18 needs to be cancelled with Dr. Celine Ahr and we need to see which plastic surgeon patient would like to be referred to so we can arrange referral.

## 2018-12-20 ENCOUNTER — Ambulatory Visit: Payer: Self-pay | Admitting: General Surgery

## 2018-12-26 ENCOUNTER — Other Ambulatory Visit: Payer: Self-pay

## 2018-12-26 ENCOUNTER — Encounter: Payer: Self-pay | Admitting: Adult Health

## 2018-12-26 ENCOUNTER — Ambulatory Visit: Payer: Medicaid Other | Admitting: Adult Health

## 2018-12-26 VITALS — BP 112/82 | HR 81 | Temp 98.0°F | Resp 16 | Ht 67.0 in | Wt 191.0 lb

## 2018-12-26 DIAGNOSIS — S8001XA Contusion of right knee, initial encounter: Secondary | ICD-10-CM

## 2018-12-26 DIAGNOSIS — M1710 Unilateral primary osteoarthritis, unspecified knee: Secondary | ICD-10-CM

## 2018-12-26 MED ORDER — IBUPROFEN 800 MG PO TABS: ORAL_TABLET | ORAL | 3 refills | Status: DC

## 2018-12-26 NOTE — Progress Notes (Signed)
[facility] [address] [place], [place] [address]  Internal MEDICINE  Office Visit Note  Patient Name: Whitney Brandt  [id number]  [id number]  Date of Service: 12/26/2018  Chief Complaint  Patient presents with  . Knee Pain    left knee pain, hit it real hard a wood cabinet 3 days ago , and keeps hitting it      HPI [name] for a sick visit. Patient complaining of right knee pain after hitting knee on piece of wood furniture. Pain has been going on for 3 days, pain is severe and minimal relief with ibuprofen and codeine. Pain is limiting movement, unable to exercise due to pain and not sleeping well due to pain.    Current Medication:  Outpatient Encounter Medications as of 12/26/2018  Medication Sig  . Acetaminophen-Codeine (TYLENOL/CODEINE #3) 300-30 MG tablet Take 1 tablet by mouth every 6 (six) hours as needed for pain.  . chlorhexidine (HIBICLENS) 4 % external liquid   . fluticasone (FLONASE) 50 MCG/ACT nasal spray Place 2 sprays into both nostrils daily.  Marland Kitchen ibuprofen (ADVIL) 800 MG tablet Take 1 tab po three times a day AS NEEDED FOR PAIN  . levocetirizine (XYZAL) 5 MG tablet Take 1 tab po day for allergic rhinitis  . mupirocin ointment (BACTROBAN) 2 % Apply to affected areas twice daily as needed  . nystatin (MYCOSTATIN/NYSTOP) powder Apply topically 4 (four) times daily.  Marland Kitchen nystatin ointment (MYCOSTATIN) Apply 1 application topically 3 (three) times daily.  . phentermine (ADIPEX-P) 37.5 MG tablet Take 1 tablet (37.5 mg total) by mouth daily before breakfast.  . sulfamethoxazole-trimethoprim (BACTRIM DS) 800-160 MG tablet Take 1 tablet by mouth 2 (two) times daily.   No facility-administered encounter medications on file as of 12/26/2018.       Medical History: Past Medical History:  Diagnosis Date  . Diabetes mellitus without complication (HCC)      Vital Signs: BP 112/82   Pulse 81   Temp 98 F (36.7 C)   Resp 16   Ht 5\' 7"  (1.702 m)    Wt 191 lb (86.6 kg)   SpO2 98%   BMI 29.91 kg/m    Review of Systems  Constitutional: Positive for activity change. Negative for chills, fatigue and unexpected weight change.  HENT: Negative for congestion, rhinorrhea, sneezing and sore throat.   Eyes: Negative for photophobia, pain and redness.  Respiratory: Negative for cough, chest tightness and shortness of breath.   Cardiovascular: Negative for chest pain and palpitations.  Gastrointestinal: Negative for abdominal pain, constipation, diarrhea, nausea and vomiting.  Endocrine: Negative.   Genitourinary: Negative for dysuria and frequency.  Musculoskeletal: Negative for arthralgias, back pain, joint swelling and neck pain.       Right knee pain and tenderness  Skin: Negative for rash.  Allergic/Immunologic: Negative.   Neurological: Negative for tremors and numbness.  Hematological: Negative for adenopathy. Does not bruise/bleed easily.  Psychiatric/Behavioral: Negative for behavioral problems and sleep disturbance. The patient is not nervous/anxious.     Physical Exam Vitals signs and nursing note reviewed. Exam conducted with a chaperone present.  Constitutional:      General: [name] not in acute distress.    Appearance: [name] well-developed. [name] not diaphoretic.  HENT:     Head: Normocephalic and atraumatic.     Mouth/Throat:     Pharynx: No oropharyngeal exudate.  Eyes:     Pupils: Pupils are equal, round, and reactive to light.  Neck:  Musculoskeletal: Normal range of motion and neck supple.     Thyroid: No thyromegaly.     Vascular: No JVD.     Trachea: No tracheal deviation.  Cardiovascular:     Rate and Rhythm: Normal rate and regular rhythm.     Heart sounds: Normal heart sounds. No murmur. No friction rub. No gallop.   Pulmonary:     Effort: Pulmonary effort is normal. No respiratory distress.     Breath sounds: Normal breath sounds. No wheezing or rales.  Chest:     Chest wall: No tenderness.   Abdominal:     Palpations: Abdomen is soft.     Tenderness: There is no abdominal tenderness. There is no guarding.  Musculoskeletal: Normal range of motion.        General: Tenderness present.  Lymphadenopathy:     Cervical: No cervical adenopathy.  Skin:    General: Skin is warm and dry.  Neurological:     Mental Status: [name] alert and oriented to person, place, and time.     Cranial Nerves: No cranial nerve deficit.  Psychiatric:        Behavior: Behavior normal.        Thought Content: Thought content normal.        Judgment: Judgment normal.    Assessment/Plan: 1. Contusion of right knee, initial encounter Rest, Ice for 20 minutes on, 2 hours off as needed.  Take antiinflammatories as discussed. Return to clinic if symptoms worsen, progress or fail to improve.  2. Morbid obesity ([place]) Obesity Counseling: Risk Assessment: An assessment of behavioral risk factors was made today and includes lack of exercise sedentary lifestyle, lack of portion control and poor dietary habits.  Risk Modification Advice: She was counseled on portion control guidelines. Restricting daily caloric intake to. . The detrimental long term effects of obesity on her health and ongoing poor compliance was also discussed with the patient.    General Counseling: Whitney Brandt verbalizes understanding of the findings of todays visit and agrees with plan of treatment. I have discussed any further diagnostic evaluation that may be needed or ordered today. We also reviewed her medications today. she has been encouraged to call the office with any questions or concerns that should arise related to todays visit.   No orders of the defined types were placed in this encounter.   No orders of the defined types were placed in this encounter.   Time spent: 15 Minutes  This patient was seen by Orson Gear AGNP-C in Collaboration with Dr Lavera Guise as a part of collaborative care agreement.  Whitney Brandt  AGNP-C Internal Medicine

## 2019-01-04 ENCOUNTER — Other Ambulatory Visit: Payer: Self-pay

## 2019-01-04 DIAGNOSIS — M25561 Pain in right knee: Secondary | ICD-10-CM

## 2019-01-24 ENCOUNTER — Encounter: Payer: Self-pay | Admitting: Nurse Practitioner

## 2019-01-24 ENCOUNTER — Other Ambulatory Visit: Payer: Self-pay

## 2019-01-24 ENCOUNTER — Ambulatory Visit: Payer: Medicaid Other | Admitting: Nurse Practitioner

## 2019-01-24 DIAGNOSIS — M1710 Unilateral primary osteoarthritis, unspecified knee: Secondary | ICD-10-CM | POA: Diagnosis not present

## 2019-01-24 MED ORDER — ACETAMINOPHEN-CODEINE 300-30 MG PO TABS: 1.0000 | ORAL_TABLET | Freq: Four times a day (QID) | ORAL | 1 refills | Status: DC | PRN

## 2019-01-24 MED ORDER — PHENTERMINE HCL 37.5 MG PO TABS: 37.5000 mg | ORAL_TABLET | Freq: Every day | ORAL | 1 refills | Status: DC

## 2019-01-24 NOTE — Progress Notes (Signed)
[facility] [place], [place] [address]  Internal MEDICINE  Office Visit Note  Patient Name: Whitney Brandt  [id number]  [id number]  Date of Service: 01/24/2019  Chief Complaint  Patient presents with  . Follow-up    weight management     The patient is here for follow up weight management. [name] currently taking phentermine. She has lost close to 135 pounds since starting on weight management program. [name] closely watching her diet and exercising frequently. She has no negative side effects to report.  She did, recently hurt her "bad " knee. Was black and blue and very sore. She has been taking her Tylenol #3 a bit more often than usual. She does need a refill for this today .      Current Medication: Outpatient Encounter Medications as of 01/24/2019  Medication Sig  . Acetaminophen-Codeine (TYLENOL/CODEINE #3) 300-30 MG tablet Take 1 tablet by mouth every 6 (six) hours as needed for pain.  . chlorhexidine (HIBICLENS) 4 % external liquid   . fluticasone (FLONASE) 50 MCG/ACT nasal spray Place 2 sprays into both nostrils daily.  Marland Kitchen ibuprofen (ADVIL) 800 MG tablet Take 1 tab po three times a day AS NEEDED FOR PAIN  . levocetirizine (XYZAL) 5 MG tablet Take 1 tab po day for allergic rhinitis  . mupirocin ointment (BACTROBAN) 2 % Apply to affected areas twice daily as needed  . nystatin (MYCOSTATIN/NYSTOP) powder Apply topically 4 (four) times daily.  Marland Kitchen nystatin ointment (MYCOSTATIN) Apply 1 application topically 3 (three) times daily.  . phentermine (ADIPEX-P) 37.5 MG tablet Take 1 tablet (37.5 mg total) by mouth daily before breakfast.  . [DISCONTINUED] Acetaminophen-Codeine (TYLENOL/CODEINE #3) 300-30 MG tablet Take 1 tablet by mouth every 6 (six) hours as needed for pain.  . [DISCONTINUED] phentermine (ADIPEX-P) 37.5 MG tablet Take 1 tablet (37.5 mg total) by mouth daily before breakfast.  . [DISCONTINUED] sulfamethoxazole-trimethoprim (BACTRIM DS)  800-160 MG tablet Take 1 tablet by mouth 2 (two) times daily. (Patient not taking: Reported on 01/24/2019)   No facility-administered encounter medications on file as of 01/24/2019.     Surgical History: Past Surgical History:  Procedure Laterality Date  . ABDOMINAL HYSTERECTOMY      Medical History: Past Medical History:  Diagnosis Date  . Diabetes mellitus without complication ([place])     Family History: Family History  Problem Relation Age of Onset  . Diabetes Mother   . Hypertension Mother   . Thyroid disease Mother     Social History   Socioeconomic History  . Marital status: Single    Spouse name: Not on file  . Number of children: Not on file  . Years of education: Not on file  . Highest education level: Not on file  Occupational History  . Not on file  Social Needs  . Financial resource strain: Not on file  . Food insecurity    Worry: Not on file    Inability: Not on file  . Transportation needs    Medical: Not on file    Non-medical: Not on file  Tobacco Use  . Smoking status: Never Smoker  . Smokeless tobacco: Never Used  Substance and Sexual Activity  . Alcohol use: No    Frequency: Never  . Drug use: No  . Sexual activity: Never  Lifestyle  . Physical activity    Days per week: Not on file    Minutes per session: Not on file  . Stress: Not on file  Relationships  . Social Musician on phone: Not on file    Gets together: Not on file    Attends religious service: Not on file    Active member of club or organization: Not on file    Attends meetings of clubs or organizations: Not on file    Relationship status: Not on file  . Intimate partner violence    Fear of current or ex partner: Not on file    Emotionally abused: Not on file    Physically abused: Not on file    Forced sexual activity: Not on file  Other Topics Concern  . Not on file  Social History Narrative  . Not on file      Review of Systems  Constitutional:  Negative for activity change, appetite change and fatigue.       Weight loss of two pounds since her last visit.   HENT: Negative for congestion, ear discharge, ear pain and trouble swallowing.   Respiratory: Negative for chest tightness, shortness of breath and wheezing.   Cardiovascular: Negative for chest pain and palpitations.  Gastrointestinal: Negative for constipation, diarrhea, nausea and vomiting.  Endocrine: Negative for cold intolerance, heat intolerance, polydipsia and polyuria.  Musculoskeletal: Positive for arthralgias.       Intermittent knee pain, especially at night. Will generally take tylenol with codeine at night which helps to relieve the pain and enables to rest well.   Skin: Negative for rash.  Allergic/Immunologic: Positive for environmental allergies.  Neurological: Negative for dizziness, weakness, numbness and headaches.  Hematological: Negative for adenopathy.  Psychiatric/Behavioral: Negative for dysphoric mood. The patient is not nervous/anxious.     Today's Vitals   01/24/19 1441  BP: 118/79  Pulse: 90  Resp: 16  Temp: (!) 97.2 F (36.2 C)  SpO2: 97%  Weight: 189 lb 9.6 oz (86 kg)  Height: 5\' 7"  (1.702 m)   Body mass index is 29.7 kg/m.  Physical Exam Vitals signs and nursing note reviewed.  Constitutional:      Appearance: [name] well-developed. [name] obese.  HENT:     Head: Normocephalic and atraumatic.     Mouth/Throat:     Pharynx: Oropharynx is clear.  Eyes:     Conjunctiva/sclera: Conjunctivae normal.     Pupils: Pupils are equal, round, and reactive to light.  Neck:     Musculoskeletal: Normal range of motion and neck supple.     Thyroid: No thyromegaly.  Cardiovascular:     Rate and Rhythm: Normal rate and regular rhythm.     Heart sounds: Normal heart sounds.  Pulmonary:     Effort: Pulmonary effort is normal.     Breath sounds: Normal breath sounds. No wheezing.  Abdominal:     Palpations: Abdomen is soft.  Musculoskeletal:      Right knee: She exhibits decreased range of motion and swelling. Tenderness found.     Left knee: She exhibits decreased range of motion and swelling. Tenderness found.     Comments: Bilateral knee pain, more severe after exertion. There is bruising and swelling over the right patella. ROM and strength of the right knee is currently intact.   Skin:    General: Skin is warm and dry.     Capillary Refill: Capillary refill takes less than 2 seconds.     Findings: No erythema or rash.  Neurological:     Mental Status: [name] alert and oriented to person, place, and time. Mental status is  at baseline.     Comments: Patient is at her neurological baseline.   Psychiatric:        Behavior: Behavior normal.        Thought Content: Thought content normal.        Judgment: Judgment normal.   Assessment/Plan: 1. Arthrosis of knee Pain from recent injury improving. May continue to take tylenol #3 at bedtime as needed for pain. Take ibuprofen during the day to reduce pain and inflammation. Apply a compressive ACE bandage. Rest and elevate the affected painful area.  Apply cold compresses intermittently as needed.  As pain recedes, begin normal activities slowly as tolerated.  - Acetaminophen-Codeine (TYLENOL/CODEINE #3) 300-30 MG tablet; Take 1 tablet by mouth every 6 (six) hours as needed for pain.  Dispense: 30 tablet; Refill: 1  2. Morbid obesity (HCC) Continues to make progress. Take phentermine 37.5mg  daily. Limit calorie intake to 1200 calories per day. Continue with regular exercise. - phentermine (ADIPEX-P) 37.5 MG tablet; Take 1 tablet (37.5 mg total) by mouth daily before breakfast.  Dispense: 30 tablet; Refill: 1  General Counseling: Dorthie verbalizes understanding of the findings of todays visit and agrees with plan of treatment. I have discussed any further diagnostic evaluation that may be needed or ordered today. We also reviewed her medications today. she has been encouraged to call  the office with any questions or concerns that should arise related to todays visit.   This patient was seen by Vincent GrosHeather Colie Josten FNP Collaboration with Dr Lyndon CodeFozia M Khan as a part of collaborative care agreement  Meds ordered this encounter  Medications  . Acetaminophen-Codeine (TYLENOL/CODEINE #3) 300-30 MG tablet    Sig: Take 1 tablet by mouth every 6 (six) hours as needed for pain.    Dispense:  30 tablet    Refill:  1    This is not for acute pain. 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    Order Specific Question:   Supervising Provider    Answer:   Lyndon CodeKHAN, FOZIA M [[id number]]  . phentermine (ADIPEX-P) 37.5 MG tablet    Sig: Take 1 tablet (37.5 mg total) by mouth daily before breakfast.    Dispense:  30 tablet    Refill:  1    Order Specific Question:   Supervising Provider    Answer:   Lyndon CodeKHAN, FOZIA M [[id number]]    Time spent: 4315 Minutes      Dr Lyndon CodeFozia M Khan Internal medicine

## 2019-01-25 ENCOUNTER — Other Ambulatory Visit: Payer: Self-pay

## 2019-01-25 MED ORDER — LEVOCETIRIZINE DIHYDROCHLORIDE 5 MG PO TABS: ORAL_TABLET | ORAL | 3 refills | Status: DC

## 2019-02-05 ENCOUNTER — Institutional Professional Consult (permissible substitution): Payer: Medicaid Other | Admitting: Plastic Surgery

## 2019-02-07 ENCOUNTER — Other Ambulatory Visit: Payer: Self-pay

## 2019-02-07 ENCOUNTER — Encounter: Payer: Self-pay | Admitting: Plastic Surgery

## 2019-02-07 ENCOUNTER — Ambulatory Visit (INDEPENDENT_AMBULATORY_CARE_PROVIDER_SITE_OTHER): Payer: Medicaid Other | Admitting: Plastic Surgery

## 2019-02-07 VITALS — BP 116/78 | HR 68 | Temp 97.1°F | Ht 67.0 in | Wt 188.0 lb

## 2019-02-07 DIAGNOSIS — E65 Localized adiposity: Secondary | ICD-10-CM | POA: Diagnosis not present

## 2019-02-07 NOTE — Progress Notes (Signed)
Referring Provider Carlean Jews, NP [address] [place],  [place] [address]   CC:  Chief Complaint  Patient presents with  . Advice Only    for abscess on abdomen      Whitney Brandt is an 28 y.o. female.  HPI: Patient is here to discuss issues and symptoms regarding her overhanging pannus.  She has lost a significant amount of weight over the past 6 months.  She was started on a amphetamine type medication to curb her appetite.  She has lost about 50 pounds over the last 3 months.  In total she has lost over 100 pounds.  They have had a few episodes of abscesses in the crease beneath the pannus.  They have had a difficult time managing the area with hygiene and have needed nystatin powders and creams to avoid rashes.  No Known Allergies  Outpatient Encounter Medications as of 02/07/2019  Medication Sig  . Acetaminophen-Codeine (TYLENOL/CODEINE #3) 300-30 MG tablet Take 1 tablet by mouth every 6 (six) hours as needed for pain.  . chlorhexidine (HIBICLENS) 4 % external liquid   . fluticasone (FLONASE) 50 MCG/ACT nasal spray Place 2 sprays into both nostrils daily.  Marland Kitchen ibuprofen (ADVIL) 800 MG tablet Take 1 tab po three times a day AS NEEDED FOR PAIN  . levocetirizine (XYZAL) 5 MG tablet Take 1 tab po day for allergic rhinitis  . mupirocin ointment (BACTROBAN) 2 % Apply to affected areas twice daily as needed  . nystatin (MYCOSTATIN/NYSTOP) powder Apply topically 4 (four) times daily.  Marland Kitchen nystatin ointment (MYCOSTATIN) Apply 1 application topically 3 (three) times daily.  . phentermine (ADIPEX-P) 37.5 MG tablet Take 1 tablet (37.5 mg total) by mouth daily before breakfast.  . sulfamethoxazole-trimethoprim (BACTRIM DS) 800-160 MG tablet Take 1 tablet by mouth 2 (two) times daily.   No facility-administered encounter medications on file as of 02/07/2019.      Past Medical History:  Diagnosis Date  . Diabetes mellitus without complication [facility])     Past Surgical History:    Procedure Laterality Date  . ABDOMINAL HYSTERECTOMY      Family History  Problem Relation Age of Onset  . Diabetes Mother   . Hypertension Mother   . Thyroid disease Mother     Social History   Social History Narrative  . Not on file  No tobacco use  Review of Systems General: Denies fevers, chills CV: Denies chest pain, shortness of breath, palpitations   Physical Exam Vitals with BMI 02/07/2019 01/24/2019 12/26/2018  Height 5\' 7"  5\' 7"  5\' 7"   Weight 188 lbs 189 lbs 10 oz 191 lbs  BMI 29.44 29.69 29.91  Systolic 116 118  Diastolic 78 79 82  Pulse 68 90 81    General:  No acute distress,  Alert and oriented, Non-Toxic, Normal speech and affect Abdomen: She has a large overhanging infraumbilical pannus.  She also has significant skin excess in the supraumbilical area.  I cannot palpate any hernias.  She has a lower vertical scar from hysterectomy.  There is evidence of some skin irritation in the fold along with moisture.  Assessment/Plan Patient presents with a symptomatic overhanging abdominal pannus after significant weight loss.  She continues to lose weight and has lost 50 pounds over the last 3 months.  I think she would be a reasonable candidate for infraumbilical panniculectomy once her weight has stabilized.  We discussed the details of that surgery today.  I explained it would not address  the skin in the upper abdomen.  I would likely require a cosmetic add-on vertical component to the excision.  We will get a give them a cosmetic quote for that additional portion of the procedure.  I advised them to make another appointment in 3 months and if the weight has not stabilized then to call and postpone it.  Otherwise once the weight is stabilized I think she would be appropriate for surgery.  Cindra Presume 02/07/2019, 2:41 PM

## 2019-02-27 ENCOUNTER — Telehealth: Payer: Self-pay

## 2019-02-27 NOTE — Telephone Encounter (Signed)
CONFIRMED AND SCREENED FOR 03-05-19 OV. °

## 2019-03-04 ENCOUNTER — Telehealth: Payer: Self-pay

## 2019-03-04 NOTE — Telephone Encounter (Signed)
Called lmom to change appointment to video. klh °

## 2019-03-05 ENCOUNTER — Ambulatory Visit: Payer: Medicaid Other | Admitting: Nurse Practitioner

## 2019-03-05 ENCOUNTER — Other Ambulatory Visit: Payer: Self-pay | Admitting: Nurse Practitioner

## 2019-03-05 VITALS — Ht 67.0 in | Wt 187.0 lb

## 2019-03-05 DIAGNOSIS — N390 Urinary tract infection, site not specified: Secondary | ICD-10-CM | POA: Diagnosis not present

## 2019-03-05 DIAGNOSIS — M1710 Unilateral primary osteoarthritis, unspecified knee: Secondary | ICD-10-CM

## 2019-03-05 DIAGNOSIS — Z6829 Body mass index (BMI) 29.0-29.9, adult: Secondary | ICD-10-CM | POA: Diagnosis not present

## 2019-03-05 MED ORDER — SULFAMETHOXAZOLE-TRIMETHOPRIM 800-160 MG PO TABS: 1.0000 | ORAL_TABLET | Freq: Two times a day (BID) | ORAL | 0 refills | Status: DC

## 2019-03-05 MED ORDER — ACETAMINOPHEN-CODEINE 300-30 MG PO TABS: 1.0000 | ORAL_TABLET | Freq: Four times a day (QID) | ORAL | 1 refills | Status: DC | PRN

## 2019-03-05 MED ORDER — PHENTERMINE HCL 37.5 MG PO TABS: 37.5000 mg | ORAL_TABLET | Freq: Every day | ORAL | 1 refills | Status: DC

## 2019-03-05 NOTE — Progress Notes (Signed)
For telephone visit, sent prescriptions for phentermine, tylenol #3, and bactrim Ds bid for 10 days due to UTI. All sent to [facility].

## 2019-03-06 ENCOUNTER — Encounter: Payer: Self-pay | Admitting: Nurse Practitioner

## 2019-03-06 DIAGNOSIS — Z6829 Body mass index (BMI) 29.0-29.9, adult: Secondary | ICD-10-CM | POA: Insufficient documentation

## 2019-03-06 DIAGNOSIS — N390 Urinary tract infection, site not specified: Secondary | ICD-10-CM | POA: Insufficient documentation

## 2019-03-06 NOTE — Progress Notes (Signed)
[facility] [place], [place] [address]  Internal MEDICINE  Telephone Visit  Patient Name: Whitney Brandt  [id number]  [id number]  Date of Service: 03/06/2019  I connected with the patient at 1:45pm by telephone and verified the patients identity using two identifiers.   I discussed the limitations, risks, security and privacy concerns of performing an evaluation and management service by telephone and the availability of in person appointments. I also discussed with the patient that there may be a patient responsible charge related to the service.  The patient expressed understanding and agrees to proceed.    Chief Complaint  Patient presents with  . Medical Management of Chronic Issues    WEIGHT MANAGEMENT  . Telephone Screen  . Telephone Assessment    The patient has been contacted via telephone for follow up visit due to concerns for spread of novel coronavirus. She presents for follow up of weight management. She has lost an additional 2 pounds since she was last seen. She has lost nearly 140 pounds since starting on phentermine to help with weight management. She has no negative side effects related to taking appetite suppressant.  Today, she c/o burning and pain with urination. States that symptoms started last night. She has no abdominal pain, nausea, vomiting, or fever.  She continues to have pain in her knees. Will take ibuprofen for this during the day and will need to take tylenol #3 at night so she can rest despite the pain.       Current Medication: Outpatient Encounter Medications as of 03/05/2019  Medication Sig  . Acetaminophen-Codeine (TYLENOL/CODEINE #3) 300-30 MG tablet Take 1 tablet by mouth every 6 (six) hours as needed for pain.  . chlorhexidine (HIBICLENS) 4 % external liquid   . fluticasone (FLONASE) 50 MCG/ACT nasal spray Place 2 sprays into both nostrils daily.  Marland Kitchen ibuprofen (ADVIL) 800 MG tablet Take 1 tab po three times a day  AS NEEDED FOR PAIN  . levocetirizine (XYZAL) 5 MG tablet Take 1 tab po day for allergic rhinitis  . mupirocin ointment (BACTROBAN) 2 % Apply to affected areas twice daily as needed  . nystatin (MYCOSTATIN/NYSTOP) powder Apply topically 4 (four) times daily.  Marland Kitchen nystatin ointment (MYCOSTATIN) Apply 1 application topically 3 (three) times daily.  . phentermine (ADIPEX-P) 37.5 MG tablet Take 1 tablet (37.5 mg total) by mouth daily before breakfast.  . sulfamethoxazole-trimethoprim (BACTRIM DS) 800-160 MG tablet Take 1 tablet by mouth 2 (two) times daily.  . [DISCONTINUED] Acetaminophen-Codeine (TYLENOL/CODEINE #3) 300-30 MG tablet Take 1 tablet by mouth every 6 (six) hours as needed for pain.  . [DISCONTINUED] phentermine (ADIPEX-P) 37.5 MG tablet Take 1 tablet (37.5 mg total) by mouth daily before breakfast.  . [DISCONTINUED] sulfamethoxazole-trimethoprim (BACTRIM DS) 800-160 MG tablet Take 1 tablet by mouth 2 (two) times daily.   No facility-administered encounter medications on file as of 03/05/2019.     Surgical History: Past Surgical History:  Procedure Laterality Date  . ABDOMINAL HYSTERECTOMY      Medical History: Past Medical History:  Diagnosis Date  . Diabetes mellitus without complication ([place])     Family History: Family History  Problem Relation Age of Onset  . Diabetes Mother   . Hypertension Mother   . Thyroid disease Mother     Social History   Socioeconomic History  . Marital status: Single    Spouse name: Not on file  . Number of children: Not on file  . Years of education:  Not on file  . Highest education level: Not on file  Occupational History  . Not on file  Social Needs  . Financial resource strain: Not on file  . Food insecurity    Worry: Not on file    Inability: Not on file  . Transportation needs    Medical: Not on file    Non-medical: Not on file  Tobacco Use  . Smoking status: Never Smoker  . Smokeless tobacco: Never Used  Substance and  Sexual Activity  . Alcohol use: No    Frequency: Never  . Drug use: No  . Sexual activity: Never  Lifestyle  . Physical activity    Days per week: Not on file    Minutes per session: Not on file  . Stress: Not on file  Relationships  . Social Musicianconnections    Talks on phone: Not on file    Gets together: Not on file    Attends religious service: Not on file    Active member of club or organization: Not on file    Attends meetings of clubs or organizations: Not on file    Relationship status: Not on file  . Intimate partner violence    Fear of current or ex partner: Not on file    Emotionally abused: Not on file    Physically abused: Not on file    Forced sexual activity: Not on file  Other Topics Concern  . Not on file  Social History Narrative  . Not on file      Review of Systems  Constitutional: Negative for activity change, chills, fatigue and unexpected weight change.       Two pound weight loss since her last visit  HENT: Negative for congestion, postnasal drip, rhinorrhea, sneezing and sore throat.   Respiratory: Negative for cough, chest tightness, shortness of breath and wheezing.   Cardiovascular: Negative for chest pain and palpitations.  Gastrointestinal: Negative for abdominal pain, constipation, diarrhea, nausea and vomiting.  Endocrine: Negative for cold intolerance, heat intolerance, polydipsia and polyuria.  Genitourinary: Positive for dysuria and frequency. Negative for pelvic pain.  Musculoskeletal: Positive for arthralgias and myalgias. Negative for back pain, joint swelling and neck pain.  Skin: Negative for rash.  Allergic/Immunologic: Negative for environmental allergies.  Neurological: Negative for dizziness, tremors, numbness and headaches.  Hematological: Negative for adenopathy. Does not bruise/bleed easily.  Psychiatric/Behavioral: Negative for behavioral problems (Depression), sleep disturbance and suicidal ideas. The patient is not  nervous/anxious.     Today's Vitals   03/06/19 0856  Weight: 187 lb (84.8 kg)  Height: 5\' 7"  (1.702 m)   Body mass index is 29.29 kg/m.  Observation/Objective:   The patient is alert and oriented. [name] pleasant and answers all questions appropriately. Breathing is non-labored. [name] in no acute distress at this time.    Assessment/Plan:  1. Urinary tract infection without hematuria, site unspecified Start bactrim DS bid for 10 days. Increase water intake. Recommended use of OTC AZO to reduce bladder pain and spasms.   2. Arthrosis of knee May continue to take tylenol #3 at bedtime as needed for pain. New prescription sent to her pharmacy today.   3. BMI 29.0-29.9,adult Continues to improve. May continue phentermine daily. Limit calorie intake to 1200 - 1500 calories per day and exercise regularly.  General Counseling: Kaliana verbalizes understanding of the findings of today's phone visit and agrees with plan of treatment. I have discussed any further diagnostic evaluation that may be needed or  ordered today. We also reviewed her medications today. she has been encouraged to call the office with any questions or concerns that should arise related to todays visit.    There is a liability release in patients' chart. There has been a 10 minute discussion about the side effects including but not limited to elevated blood pressure, anxiety, lack of sleep and dry mouth. Pt understands and will like to start/continue on appetite suppressant at this time. There will be one month RX given at the time of visit with proper follow up. Nova diet plan with restricted calories is given to the pt. Pt understands and agrees with  plan of treatment  This patient was seen by Vincent Gros FNP Collaboration with Dr Lyndon Code as a part of collaborative care agreement  Time spent: 25 Minutes    Dr Lyndon Code Internal medicine

## 2019-04-01 ENCOUNTER — Ambulatory Visit: Payer: Medicaid Other | Admitting: Nurse Practitioner

## 2019-04-01 ENCOUNTER — Other Ambulatory Visit: Payer: Self-pay

## 2019-04-01 ENCOUNTER — Encounter: Payer: Self-pay | Admitting: Nurse Practitioner

## 2019-04-01 VITALS — Temp 101.3°F | Wt 185.0 lb

## 2019-04-01 DIAGNOSIS — J020 Streptococcal pharyngitis: Secondary | ICD-10-CM

## 2019-04-01 DIAGNOSIS — R509 Fever, unspecified: Secondary | ICD-10-CM | POA: Insufficient documentation

## 2019-04-01 MED ORDER — AMOXICILLIN-POT CLAVULANATE 875-125 MG PO TABS: 1.0000 | ORAL_TABLET | Freq: Two times a day (BID) | ORAL | 0 refills | Status: DC

## 2019-04-01 NOTE — Progress Notes (Signed)
[facility] [address] [place], [place] [address]  Internal MEDICINE  Telephone Visit  Patient Name: Whitney Brandt  [id number]  [id number]  Date of Service: 04/01/2019  I connected with the patient at 11:23am by telephone and verified the patients identity using two identifiers.   I discussed the limitations, risks, security and privacy concerns of performing an evaluation and management service by telephone and the availability of in person appointments. I also discussed with the patient that there may be a patient responsible charge related to the service.  The patient expressed understanding and agrees to proceed.    Chief Complaint  Patient presents with  . Telephone Screen    pt did test on last friday   . Telephone Assessment  . Sore Throat  . Ear Pain  . Fever    no smell and taste     The patient has been contacted via telephone for follow up visit due to concerns for spread of novel coronavirus. The patient presents for acute visit. The patient states that she his having sore throat, nasal congestion, and fever. She states that symptoms started on Saturday. They have gradually been getting worse. She was around a family member who was diagnosed with strep. She was not told until after she had been around this family member.       Current Medication: Outpatient Encounter Medications as of 04/01/2019  Medication Sig  . Acetaminophen-Codeine (TYLENOL/CODEINE #3) 300-30 MG tablet Take 1 tablet by mouth every 6 (six) hours as needed for pain.  . chlorhexidine (HIBICLENS) 4 % external liquid   . fluticasone (FLONASE) 50 MCG/ACT nasal spray Place 2 sprays into both nostrils daily.  Marland Kitchen ibuprofen (ADVIL) 800 MG tablet Take 1 tab po three times a day AS NEEDED FOR PAIN  . levocetirizine (XYZAL) 5 MG tablet Take 1 tab po day for allergic rhinitis  . mupirocin ointment (BACTROBAN) 2 % Apply to affected areas twice daily as needed  . nystatin (MYCOSTATIN/NYSTOP)  powder Apply topically 4 (four) times daily.  Marland Kitchen nystatin ointment (MYCOSTATIN) Apply 1 application topically 3 (three) times daily.  . phentermine (ADIPEX-P) 37.5 MG tablet Take 1 tablet (37.5 mg total) by mouth daily before breakfast.  . amoxicillin-clavulanate (AUGMENTIN) 875-125 MG tablet Take 1 tablet by mouth 2 (two) times daily.  Marland Kitchen sulfamethoxazole-trimethoprim (BACTRIM DS) 800-160 MG tablet Take 1 tablet by mouth 2 (two) times daily. (Patient not taking: Reported on 04/01/2019)   No facility-administered encounter medications on file as of 04/01/2019.    Surgical History: Past Surgical History:  Procedure Laterality Date  . ABDOMINAL HYSTERECTOMY      Medical History: Past Medical History:  Diagnosis Date  . Diabetes mellitus without complication ([facility])     Family History: Family History  Problem Relation Age of Onset  . Diabetes Mother   . Hypertension Mother   . Thyroid disease Mother     Social History   Socioeconomic History  . Marital status: Single    Spouse name: Not on file  . Number of children: Not on file  . Years of education: Not on file  . Highest education level: Not on file  Occupational History  . Not on file  Tobacco Use  . Smoking status: Never Smoker  . Smokeless tobacco: Never Used  Substance and Sexual Activity  . Alcohol use: No  . Drug use: No  . Sexual activity: Never  Other Topics Concern  . Not on file  Social History Narrative  .  Not on file   Social Determinants of Health   Financial Resource Strain:   . Difficulty of Paying Living Expenses: Not on file  Food Insecurity:   . Worried About Programme researcher, broadcasting/film/videounning Out of Food in the Last Year: Not on file  . Ran Out of Food in the Last Year: Not on file  Transportation Needs:   . Lack of Transportation (Medical): Not on file  . Lack of Transportation (Non-Medical): Not on file  Physical Activity:   . Days of Exercise per Week: Not on file  . Minutes of Exercise per Session: Not on file   Stress:   . Feeling of Stress : Not on file  Social Connections:   . Frequency of Communication with Friends and Family: Not on file  . Frequency of Social Gatherings with Friends and Family: Not on file  . Attends Religious Services: Not on file  . Active Member of Clubs or Organizations: Not on file  . Attends BankerClub or Organization Meetings: Not on file  . Marital Status: Not on file  Intimate Partner Violence:   . Fear of Current or Ex-Partner: Not on file  . Emotionally Abused: Not on file  . Physically Abused: Not on file  . Sexually Abused: Not on file      Review of Systems  Constitutional: Positive for fatigue and fever. Negative for activity change, chills and unexpected weight change.  HENT: Positive for congestion, postnasal drip, sinus pressure, sore throat and voice change. Negative for rhinorrhea and sneezing.   Respiratory: Negative for cough, chest tightness, shortness of breath and wheezing.   Cardiovascular: Negative for chest pain and palpitations.  Gastrointestinal: Negative for abdominal pain, constipation, diarrhea, nausea and vomiting.  Musculoskeletal: Negative for arthralgias, back pain, joint swelling and neck pain.  Skin: Negative for rash.  Neurological: Positive for headaches. Negative for tremors and numbness.  Hematological: Positive for adenopathy. Does not bruise/bleed easily.  Psychiatric/Behavioral: Negative for behavioral problems (Depression), sleep disturbance and suicidal ideas. The patient is not nervous/anxious.     Today's Vitals   04/01/19 1043  Temp: (!) 101.3 F (38.5 C)  Weight: 185 lb (83.9 kg)   Body mass index is 28.98 kg/m.  Observation/Objective:   The patient is alert and oriented. [name] pleasant and answers all questions appropriately. Breathing is non-labored. [name] in no acute distress at this time.  The patient does sound hoarse.    Assessment/Plan: 1. Strep pharyngitis Start augmentin 875mg  twice daily for 10  days. Rest and increase fluids. She should take OTC extra strength tylenol, two tablets, up to four times daily as needed and alternate with previously prescribed ibuprofen 800mg  TID as needed. She should also gargle with warm salt water as needed . - amoxicillin-clavulanate (AUGMENTIN) 875-125 MG tablet; Take 1 tablet by mouth 2 (two) times daily.  Dispense: 20 tablet; Refill: 0  2. Fever and chills She should take OTC extra strength tylenol, two tablets, up to four times daily as needed and alternate with previously prescribed ibuprofen 800mg  TID as needed.  General Counseling: Latoria verbalizes understanding of the findings of today's phone visit and agrees with plan of treatment. I have discussed any further diagnostic evaluation that may be needed or ordered today. We also reviewed her medications today. she has been encouraged to call the office with any questions or concerns that should arise related to todays visit.   Rest and increase fluids. Continue using OTC medication to control symptoms.   This patient was seen  by Leretha Pol FNP Collaboration with Dr Lavera Guise as a part of collaborative care agreement  Meds ordered this encounter  Medications  . amoxicillin-clavulanate (AUGMENTIN) 875-125 MG tablet    Sig: Take 1 tablet by mouth 2 (two) times daily.    Dispense:  20 tablet    Refill:  0    Order Specific Question:   Supervising Provider    Answer:   Lavera Guise [[id number]]    Time spent: 31 Minutes    Dr Lavera Guise Internal medicine

## 2019-04-16 ENCOUNTER — Telehealth: Payer: Self-pay

## 2019-04-16 NOTE — Telephone Encounter (Signed)
Confirmed pt appt for 04/18/2019. Whitney Brandt

## 2019-04-18 ENCOUNTER — Encounter: Payer: Self-pay | Admitting: Nurse Practitioner

## 2019-04-18 ENCOUNTER — Other Ambulatory Visit: Payer: Self-pay

## 2019-04-18 ENCOUNTER — Ambulatory Visit: Payer: Medicaid Other | Admitting: Nurse Practitioner

## 2019-04-18 VITALS — Ht 66.0 in | Wt 185.0 lb

## 2019-04-18 DIAGNOSIS — M1710 Unilateral primary osteoarthritis, unspecified knee: Secondary | ICD-10-CM

## 2019-04-18 DIAGNOSIS — Z6829 Body mass index (BMI) 29.0-29.9, adult: Secondary | ICD-10-CM | POA: Diagnosis not present

## 2019-04-18 MED ORDER — ACETAMINOPHEN-CODEINE 300-30 MG PO TABS: 1.0000 | ORAL_TABLET | Freq: Four times a day (QID) | ORAL | 1 refills | Status: DC | PRN

## 2019-04-18 MED ORDER — PHENTERMINE HCL 37.5 MG PO TABS: 37.5000 mg | ORAL_TABLET | Freq: Every day | ORAL | 1 refills | Status: DC

## 2019-04-18 NOTE — Progress Notes (Signed)
[facility] [address] [place], [place] [address]  Internal MEDICINE  Telephone Visit  Patient Name: Whitney Brandt  [id number]  [id number]  Date of Service: 04/18/2019  I connected with the patient at 11:21am by telephone and verified the patients identity using two identifiers.   I discussed the limitations, risks, security and privacy concerns of performing an evaluation and management service by telephone and the availability of in person appointments. I also discussed with the patient that there may be a patient responsible charge related to the service.  The patient expressed understanding and agrees to proceed.    Chief Complaint  Patient presents with  . Telephone Assessment  . Telephone Screen  . WEIGHT MANAGEMENT  . Cellulitis    SKIN IS LOOSE IN STOMACH AREA DUE TO WEIGHT LOSS AND CANT SEE BELLY BUTTON, AREA IS INFECTED, NYSTATIN HAS BEEN HELPING     The patient has been contacted via telephone for follow up visit due to concerns for spread of novel coronavirus. She presents for routine follow up. She continues to take phentermine daily to help with weight loss. At this point, she has lost close to 150 pounds. She exercises for 30-45 minutes every day. She limits calorie intake to between 1200 and 1500 calories per day. She does need to have a refill for this.  She continues to have pain in her knees. Will take ibuprofen for this during the day and will need to take tylenol #3 at night so she can rest despite the pain.        Current Medication: Outpatient Encounter Medications as of 04/18/2019  Medication Sig  . Acetaminophen-Codeine (TYLENOL/CODEINE #3) 300-30 MG tablet Take 1 tablet by mouth every 6 (six) hours as needed for pain.  . chlorhexidine (HIBICLENS) 4 % external liquid   . fluticasone (FLONASE) 50 MCG/ACT nasal spray Place 2 sprays into both nostrils daily.  Marland Kitchen ibuprofen (ADVIL) 800 MG tablet Take 1 tab po three times a day AS NEEDED FOR  PAIN  . levocetirizine (XYZAL) 5 MG tablet Take 1 tab po day for allergic rhinitis  . mupirocin ointment (BACTROBAN) 2 % Apply to affected areas twice daily as needed  . nystatin (MYCOSTATIN/NYSTOP) powder Apply topically 4 (four) times daily.  Marland Kitchen nystatin ointment (MYCOSTATIN) Apply 1 application topically 3 (three) times daily.  . phentermine (ADIPEX-P) 37.5 MG tablet Take 1 tablet (37.5 mg total) by mouth daily before breakfast.  . [DISCONTINUED] Acetaminophen-Codeine (TYLENOL/CODEINE #3) 300-30 MG tablet Take 1 tablet by mouth every 6 (six) hours as needed for pain.  . [DISCONTINUED] phentermine (ADIPEX-P) 37.5 MG tablet Take 1 tablet (37.5 mg total) by mouth daily before breakfast.  . [DISCONTINUED] amoxicillin-clavulanate (AUGMENTIN) 875-125 MG tablet Take 1 tablet by mouth 2 (two) times daily. (Patient not taking: Reported on 04/18/2019)  . [DISCONTINUED] sulfamethoxazole-trimethoprim (BACTRIM DS) 800-160 MG tablet Take 1 tablet by mouth 2 (two) times daily. (Patient not taking: Reported on 04/18/2019)   No facility-administered encounter medications on file as of 04/18/2019.    Surgical History: Past Surgical History:  Procedure Laterality Date  . ABDOMINAL HYSTERECTOMY      Medical History: Past Medical History:  Diagnosis Date  . Diabetes mellitus without complication ([facility])     Family History: Family History  Problem Relation Age of Onset  . Diabetes Mother   . Hypertension Mother   . Thyroid disease Mother     Social History   Socioeconomic History  . Marital status: Single  Spouse name: Not on file  . Number of children: Not on file  . Years of education: Not on file  . Highest education level: Not on file  Occupational History  . Not on file  Tobacco Use  . Smoking status: Never Smoker  . Smokeless tobacco: Never Used  Substance and Sexual Activity  . Alcohol use: No  . Drug use: No  . Sexual activity: Never  Other Topics Concern  . Not on file  Social  History Narrative  . Not on file   Social Determinants of Health   Financial Resource Strain:   . Difficulty of Paying Living Expenses: Not on file  Food Insecurity:   . Worried About Charity fundraiser in the Last Year: Not on file  . Ran Out of Food in the Last Year: Not on file  Transportation Needs:   . Lack of Transportation (Medical): Not on file  . Lack of Transportation (Non-Medical): Not on file  Physical Activity:   . Days of Exercise per Week: Not on file  . Minutes of Exercise per Session: Not on file  Stress:   . Feeling of Stress : Not on file  Social Connections:   . Frequency of Communication with Friends and Family: Not on file  . Frequency of Social Gatherings with Friends and Family: Not on file  . Attends Religious Services: Not on file  . Active Member of Clubs or Organizations: Not on file  . Attends Archivist Meetings: Not on file  . Marital Status: Not on file  Intimate Partner Violence:   . Fear of Current or Ex-Partner: Not on file  . Emotionally Abused: Not on file  . Physically Abused: Not on file  . Sexually Abused: Not on file      Review of Systems  Constitutional: Negative for activity change, appetite change and fatigue.       Weight loss of two pounds since her last visit.   HENT: Negative for congestion, ear discharge, ear pain and trouble swallowing.   Respiratory: Negative for chest tightness, shortness of breath and wheezing.   Cardiovascular: Negative for chest pain and palpitations.  Gastrointestinal: Negative for constipation, diarrhea, nausea and vomiting.  Endocrine: Negative for cold intolerance, heat intolerance, polydipsia and polyuria.  Musculoskeletal: Positive for arthralgias.       Intermittent knee pain, especially at night. Will generally take tylenol with codeine at night which helps to relieve the pain and enables to rest well.   Skin: Negative for rash.  Allergic/Immunologic: Positive for environmental  allergies.  Neurological: Negative for dizziness, weakness, numbness and headaches.  Hematological: Negative for adenopathy.  Psychiatric/Behavioral: Negative for dysphoric mood. The patient is not nervous/anxious.     Today's Vitals   04/18/19 1011  Weight: 185 lb (83.9 kg)  Height: 5\' 6"  (1.676 m)   Body mass index is 29.86 kg/m.   Observation/Objective:   The patient is alert and oriented. [name] pleasant and answers all questions appropriately. Breathing is non-labored. [name] in no acute distress at this time.    Assessment/Plan:  1. Arthrosis of knee Patient taking ibuprofen 800mg  as needed and as prescribed during the day t reduce pain and inflammation. She may continue to take Tylenol #3 at night to help pain so she may rest. New prescription sent to her pharmacy today.  - Acetaminophen-Codeine (TYLENOL/CODEINE #3) 300-30 MG tablet; Take 1 tablet by mouth every 6 (six) hours as needed for pain.  Dispense: 30 tablet;  Refill: 1  2. BMI 29.0-29.9,adult Continues to improve. May continue phentermine daily. Refill provided today.  - phentermine (ADIPEX-P) 37.5 MG tablet; Take 1 tablet (37.5 mg total) by mouth daily before breakfast.  Dispense: 30 tablet; Refill: 1 General Counseling: Kelle verbalizes understanding of the findings of today's phone visit and agrees with plan of treatment. I have discussed any further diagnostic evaluation that may be needed or ordered today. We also reviewed her medications today. she has been encouraged to call the office with any questions or concerns that should arise related to todays visit.    There is a liability release in patients' chart. There has been a 10 minute discussion about the side effects including but not limited to elevated blood pressure, anxiety, lack of sleep and dry mouth. Pt understands and will like to start/continue on appetite suppressant at this time. There will be one month RX given at the time of visit with proper  follow up. Nova diet plan with restricted calories is given to the pt. Pt understands and agrees with  plan of treatment  This patient was seen by Vincent Gros FNP Collaboration with Dr Lyndon Code as a part of collaborative care agreement  Meds ordered this encounter  Medications  . phentermine (ADIPEX-P) 37.5 MG tablet    Sig: Take 1 tablet (37.5 mg total) by mouth daily before breakfast.    Dispense:  30 tablet    Refill:  1    Order Specific Question:   Supervising Provider    Answer:   Lyndon Code [[id number]]  . Acetaminophen-Codeine (TYLENOL/CODEINE #3) 300-30 MG tablet    Sig: Take 1 tablet by mouth every 6 (six) hours as needed for pain.    Dispense:  30 tablet    Refill:  1    This is not for acute pain. 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    Order Specific Question:   Supervising Provider    Answer:   Lyndon Code [[id number]]    Time spent: 80 Minutes    Dr Lyndon Code Internal medicine

## 2019-04-26 ENCOUNTER — Telehealth: Payer: Self-pay

## 2019-04-26 NOTE — Telephone Encounter (Signed)
Ok. Thanks. They are having plastic surgery approved to remove excess skin from the belly and legs after such significant weight loss. Need to continue documenting skin issues.

## 2019-05-15 ENCOUNTER — Other Ambulatory Visit: Payer: Self-pay

## 2019-05-15 ENCOUNTER — Ambulatory Visit (INDEPENDENT_AMBULATORY_CARE_PROVIDER_SITE_OTHER): Payer: Medicaid Other | Admitting: Plastic Surgery

## 2019-05-15 ENCOUNTER — Encounter: Payer: Self-pay | Admitting: Plastic Surgery

## 2019-05-15 VITALS — BP 111/75 | HR 88 | Temp 96.9°F | Ht 66.0 in | Wt 189.4 lb

## 2019-05-15 DIAGNOSIS — M793 Panniculitis, unspecified: Secondary | ICD-10-CM

## 2019-05-15 NOTE — Progress Notes (Signed)
   Referring Provider Carlean Jews, NP [address] [place],  [place] [address]   CC:  Chief Complaint  Patient presents with  . Follow-up    Patient here to re-discuss panniculectomy      Whitney Brandt is an 29 y.o. female.  HPI: Patient comes back to discuss panniculectomy.  At her last visit she was still losing weight and I recommended that she postpone panniculectomy until her weight was more stable.  This point she says her weight has been within 5 pounds for the last 3 to 4 months.  She still getting rashes in the fold beneath her pannus and around her umbilicus.  [name] interested in moving forward with surgery if possible.  Review of Systems General: Denies fevers, chills, changes in weight  Physical Exam Vitals with BMI 05/15/2019 04/18/2019 04/01/2019  Height 5\' 6"  5\' 6"  -  Weight 189 lbs 6 oz 185 lbs 185 lbs  BMI 30.58 29.87 28.97  Systolic 111 - -  Diastolic 75 - -  Pulse 88 - -    General:  No acute distress,  Alert and oriented, Non-Toxic, Normal speech and affect Abdominal exam is unchanged.  I do not notice any hernias.  I do not see any obvious scars.  She has significant skin and fat excess in all dimensions.  Assessment/Plan Patient presents with a symptomatic abdominal pannus.  I think she is a good candidate for a fleur-de-lis panniculectomy.  We discussed the risk of this procedure that include bleeding, infection, damage surrounding structures, need for additional procedures.  We discussed the potential for wound healing complications and seromas.  We discussed the likelihood of persistent contour irregularities given the degree of her contour abnormality to start with.  We discussed the need to make a vertical and a horizontal scar in her case.  We all agree that she should stay overnight in observation.  All of her questions were answered and we will plan to move forward with fleur-de-lis panniculectomy.  05/15/2019, 3:19 PM

## 2019-05-17 ENCOUNTER — Other Ambulatory Visit: Payer: Self-pay | Admitting: Plastic Surgery

## 2019-05-29 ENCOUNTER — Telehealth: Payer: Self-pay

## 2019-05-29 NOTE — Telephone Encounter (Signed)
Confirmed and screened for 05-31-19 ov. 

## 2019-05-31 ENCOUNTER — Ambulatory Visit: Payer: Medicaid Other | Admitting: Nurse Practitioner

## 2019-06-04 ENCOUNTER — Encounter (HOSPITAL_BASED_OUTPATIENT_CLINIC_OR_DEPARTMENT_OTHER): Payer: Self-pay | Admitting: Plastic Surgery

## 2019-06-04 ENCOUNTER — Other Ambulatory Visit: Payer: Self-pay

## 2019-06-05 ENCOUNTER — Ambulatory Visit: Payer: Medicaid Other | Admitting: Surgical

## 2019-06-06 ENCOUNTER — Telehealth: Payer: Self-pay | Admitting: Plastic Surgery

## 2019-06-06 ENCOUNTER — Telehealth: Payer: Self-pay

## 2019-06-06 NOTE — Telephone Encounter (Signed)
Note in error; will update accordingly.

## 2019-06-06 NOTE — Telephone Encounter (Signed)
BILLED MISSED APPT FEE 05/31/19

## 2019-06-07 ENCOUNTER — Ambulatory Visit: Payer: Medicaid Other | Admitting: Surgical

## 2019-06-07 ENCOUNTER — Other Ambulatory Visit (HOSPITAL_COMMUNITY): Payer: Medicaid Other

## 2019-06-07 ENCOUNTER — Other Ambulatory Visit: Payer: Self-pay

## 2019-06-07 DIAGNOSIS — M1710 Unilateral primary osteoarthritis, unspecified knee: Secondary | ICD-10-CM

## 2019-06-07 MED ORDER — ACETAMINOPHEN-CODEINE 300-30 MG PO TABS: 1.0000 | ORAL_TABLET | Freq: Four times a day (QID) | ORAL | 1 refills | Status: DC | PRN

## 2019-06-11 ENCOUNTER — Ambulatory Visit (HOSPITAL_BASED_OUTPATIENT_CLINIC_OR_DEPARTMENT_OTHER): Admission: RE | Admit: 2019-06-11 | Payer: Medicaid Other | Source: Home / Self Care | Admitting: Plastic Surgery

## 2019-06-11 SURGERY — PANNICULECTOMY
Anesthesia: General | Site: Abdomen

## 2019-06-13 ENCOUNTER — Other Ambulatory Visit: Payer: Self-pay

## 2019-06-13 ENCOUNTER — Ambulatory Visit (INDEPENDENT_AMBULATORY_CARE_PROVIDER_SITE_OTHER): Payer: Medicaid Other | Admitting: Surgical

## 2019-06-13 ENCOUNTER — Encounter: Payer: Self-pay | Admitting: Surgical

## 2019-06-13 VITALS — BP 104/74 | HR 99 | Temp 97.5°F | Ht 66.0 in | Wt 182.0 lb

## 2019-06-13 DIAGNOSIS — M793 Panniculitis, unspecified: Secondary | ICD-10-CM

## 2019-06-13 DIAGNOSIS — E65 Localized adiposity: Secondary | ICD-10-CM

## 2019-06-13 NOTE — Progress Notes (Signed)
The patient is a 29 year old female here to discuss surgical plan for panniculectomy.  The surgery was canceled due to patient not having the ability to make medical decisions for herself and not having the proper legal documentation allowing family members to sign off for her to have surgery.  Her sister is here with her today during her evaluation.  They reported that they have reached out to a lawyer to have POA papers completed and signed.  But financial barriers have prevented them from following through with this.  I informed them that the patient, Whitney Brandt is unable to have any surgical intervention by our practice until we have the proper POA papers completed and signed. We had a thorough discussion about options for obtaining POA through other avenues including calling in her insurance company, calling [employer] [place] to find out about any [employer] assistance establishing POA papers, reaching out to [employer] health patient advocacy for assistance with obtaining proper documentation.  I provided them with a phone number for [facility]'s patient advocacy department and advised them to call them.  During our discussion today, the patient sister reports that the patient does not have a proper identification, and she suspects this may also be a barrier for them obtaining POA.  I encouraged them to reach out to the proper government associations, including the DMV to obtain this proper identification as this will likely be needed to obtain POA.  The patient and her sister expressed appreciation for our ongoing assistance.  They are going to reach out to [facility] health patient advocacy for assistance and guidance.  I encouraged them to also reach out to [facility] and do some individual research on obtaining POA papers and assistance they may qualify for through insurance or government programs if [facility] cannot provide any assistance.  I spent greater than 25 minutes speaking with them about  this subject during our face-to-face visit today.  I encouraged them to call us with any updates, we are available as needed for further evaluations and surgical planning.  Recommended calling with any questions or concerns.

## 2019-06-17 ENCOUNTER — Other Ambulatory Visit: Payer: Self-pay

## 2019-06-17 DIAGNOSIS — M1710 Unilateral primary osteoarthritis, unspecified knee: Secondary | ICD-10-CM

## 2019-06-17 MED ORDER — IBUPROFEN 800 MG PO TABS: ORAL_TABLET | ORAL | 1 refills | Status: DC

## 2019-06-19 ENCOUNTER — Encounter: Payer: Medicaid Other | Admitting: Plastic Surgery

## 2019-06-26 ENCOUNTER — Encounter: Payer: Medicaid Other | Admitting: Surgical

## 2019-07-03 ENCOUNTER — Other Ambulatory Visit: Payer: Self-pay

## 2019-07-03 ENCOUNTER — Encounter: Payer: Self-pay | Admitting: Surgical

## 2019-07-03 ENCOUNTER — Ambulatory Visit (INDEPENDENT_AMBULATORY_CARE_PROVIDER_SITE_OTHER): Payer: Medicaid Other | Admitting: Surgical

## 2019-07-03 VITALS — BP 114/77 | HR 96 | Temp 97.7°F | Ht 66.0 in | Wt 182.0 lb

## 2019-07-03 DIAGNOSIS — M793 Panniculitis, unspecified: Secondary | ICD-10-CM

## 2019-07-03 DIAGNOSIS — E65 Localized adiposity: Secondary | ICD-10-CM | POA: Diagnosis not present

## 2019-07-03 MED ORDER — HYDROCODONE-ACETAMINOPHEN 5-325 MG PO TABS: 1.0000 | ORAL_TABLET | Freq: Four times a day (QID) | ORAL | 0 refills | Status: AC | PRN

## 2019-07-03 MED ORDER — ONDANSETRON HCL 4 MG PO TABS: 4.0000 mg | ORAL_TABLET | Freq: Three times a day (TID) | ORAL | 0 refills | Status: DC | PRN

## 2019-07-03 NOTE — H&P (View-Only) (Signed)
   Patient ID: Whitney Brandt, female    DOB: 11/27/1990, 29 y.o.   MRN: [id number]  Chief Complaint  Patient presents with  . Follow-up      ICD-10-CM   1. Panniculitis  M79.3   2. Abdominal pannus  E65     History of Present Illness: Whitney Brandt is a 29 y.o.  female  with a history of panniculitis, weight loss of approximately 100 pounds.  She presents for preoperative evaluation for upcoming procedure, fleur de-lis panniculectomy, scheduled for 07/09/2019 with Dr. Pace.   As a team we have had multiple visits with the patient and her sister to help them obtain official documentation for power of attorney. I had previously referred the patient and her sister to [facility] patient advocacy for assistance with obtaining power of attorney documentation.  Sister reports that [place] was very helpful and they have obtained these papers.  Official documentation was scanned in the patient's chart today.   The patient has not had problems with anesthesia. No hx of DVT/PE.  No family history of DVT/PE.  No history of bleeding or clotting disorders.  Non-smoker.  Not taking any blood thinners. Patient's sister reports that patient is not a smoker, but she (the sister) is and that she sometimes smokes in the house.  Patient sister and patient both reported that patient tolerated abdominal hysterectomy without much issue.  Sister reported that patient recovered quickly from this procedure.   I was able to talk with Whitney Brandt quite a bit today, she was very talkative and was able to answer questions by verbalizing yes, no and even forming complete sentences.  She reported that she did not have any nausea, vomiting, chest pain, shortness of breath, weakness, fatigue.   Past Medical History: Allergies: No Known Allergies  Current Medications:  Current Outpatient Medications:  .  Acetaminophen-Codeine (TYLENOL/CODEINE #3) 300-30 MG tablet, Take 1 tablet by mouth every 6 (six)  hours as needed for pain., Disp: 30 tablet, Rfl: 1 .  chlorhexidine (HIBICLENS) 4 % external liquid, , Disp: , Rfl:  .  fluticasone (FLONASE) 50 MCG/ACT nasal spray, Place 2 sprays into both nostrils daily., Disp: 16 g, Rfl: 6 .  ibuprofen (ADVIL) 800 MG tablet, Take 1 tab po three times a day AS NEEDED FOR PAIN, Disp: 90 tablet, Rfl: 1 .  levocetirizine (XYZAL) 5 MG tablet, Take 1 tab po day for allergic rhinitis, Disp: 30 tablet, Rfl: 3 .  mupirocin ointment (BACTROBAN) 2 %, Apply to affected areas twice daily as needed, Disp: 44 g, Rfl: 3 .  nystatin (MYCOSTATIN/NYSTOP) powder, Apply topically 4 (four) times daily., Disp: 60 g, Rfl: 2 .  nystatin ointment (MYCOSTATIN), Apply 1 application topically 3 (three) times daily., Disp: 90 g, Rfl: 3 .  phentermine (ADIPEX-P) 37.5 MG tablet, Take 1 tablet (37.5 mg total) by mouth daily before breakfast., Disp: 30 tablet, Rfl: 1  Past Medical Problems: Past Medical History:  Diagnosis Date  . Diabetes mellitus without complication (HCC)     Past Surgical History: Past Surgical History:  Procedure Laterality Date  . ABDOMINAL HYSTERECTOMY      Social History: Social History   Socioeconomic History  . Marital status: Single    Spouse name: Not on file  . Number of children: Not on file  . Years of education: Not on file  . Highest education level: Not on file  Occupational History  . Not on file  Tobacco Use  . Smoking   status: Never Smoker  . Smokeless tobacco: Never Used  Substance and Sexual Activity  . Alcohol use: No  . Drug use: No  . Sexual activity: Never  Other Topics Concern  . Not on file  Social History Narrative  . Not on file   Social Determinants of Health   Financial Resource Strain:   . Difficulty of Paying Living Expenses:   Food Insecurity:   . Worried About Programme researcher, broadcasting/film/video in the Last Year:   . Barista in the Last Year:   Transportation Needs:   . Freight forwarder (Medical):   Marland Kitchen Lack of  Transportation (Non-Medical):   Physical Activity:   . Days of Exercise per Week:   . Minutes of Exercise per Session:   Stress:   . Feeling of Stress :   Social Connections:   . Frequency of Communication with Friends and Family:   . Frequency of Social Gatherings with Friends and Family:   . Attends Religious Services:   . Active Member of Clubs or Organizations:   . Attends Banker Meetings:   Marland Kitchen Marital Status:   Intimate Partner Violence:   . Fear of Current or Ex-Partner:   . Emotionally Abused:   Marland Kitchen Physically Abused:   . Sexually Abused:     Family History: Family History  Problem Relation Age of Onset  . Diabetes Mother   . Hypertension Mother   . Thyroid disease Mother     Review of Systems: Review of Systems  Constitutional: Negative for chills, diaphoresis, fever and malaise/fatigue.  Respiratory: Negative.   Cardiovascular: Negative.   Gastrointestinal: Positive for constipation. Negative for abdominal pain, nausea and vomiting.  Genitourinary: Negative.   Musculoskeletal: Negative.   Skin: Positive for rash. Negative for itching.  Neurological: Negative.     Physical Exam: Vital Signs BP 114/77 (BP Location: Left Arm, Patient Position: Sitting, Cuff Size: Normal)   Pulse 96   Temp 97.7 F (36.5 C) (Temporal)   Ht 5\' 6"  (1.676 m)   Wt 182 lb (82.6 kg)   SpO2 96%   BMI 29.38 kg/m  Physical Exam Exam conducted with a chaperone present.  Constitutional:      General: [name] not in acute distress.    Appearance: Normal appearance. [name] not ill-appearing.  HENT:     Head: Normocephalic and atraumatic.  Eyes:     Pupils: Pupils are equal, round Neck:     Musculoskeletal: Normal range of motion.  Cardiovascular:     Rate and Rhythm: Normal rate and regular rhythm.     Pulses: Normal pulses.     Heart sounds: Normal heart sounds. No murmur.  Pulmonary:     Effort: Pulmonary effort is normal. No respiratory distress.     Breath  sounds: Normal breath sounds. No wheezing.  Abdominal:     General: Abdomen is flat. There is no distension.     Palpations: Abdomen is soft.     Tenderness: There is no abdominal tenderness.  Musculoskeletal: Normal range of motion.  Skin:    General: Skin is warm and dry.     Findings: No erythema or rash.  Neurological:     General: No focal deficit present.     Mental Status: [name] alert and oriented to person, place, and time. Mental status is at baseline.     Motor: No weakness.  Psychiatric:        Mood and Affect: Mood normal.  Behavior: Behavior normal.   Assessment/Plan: The patient is scheduled for fleur-de-lis panniculectomy with Dr. Claudia Desanctis.  Risks, benefits, and alternatives of procedure discussed, questions answered and consent obtained.    Patient and sister were previously provided with general surgical risk consent form at last appointment for them to take home and read.  Patient was also provided with additional copy today prior to their appointment with me.  Patient and sister were provided adequate time to read through the entire consent form initial, sign, date and fill out additional information in regards to surgical risk.  During our appointment I discussed the risks with patient and sister.  The risks we discussed included but was not limited to bleeding, infection, damage to surrounding structures, pain, need for additional procedures, DVT/PE, cardiac and pulmonary complications.  Patient provided with additional copy of consent form.  Prescription sent to pharmacy for postop pain control and nausea/vomiting.  Mammogram: N/A Smoking status: Non-smoker, family member smoking in house Caprini score: 3-4, moderate, recommend mechanical prophylaxis, early ambulation.  Patient is scheduled for Covid test. Plan to stay in hospital postop for one night for observation.  During our appointment today I spent greater than 30 minutes discussing the power of  attorney situation, procedure, planning surgical date and time, discussing risks associated with surgery and discussing the overall plan with the patient and her sister  Electronically signed by: Charlies Constable, PA-C 07/03/2019 2:50 PM

## 2019-07-03 NOTE — Progress Notes (Signed)
Patient ID: Whitney Brandt, female    DOB: [birth year], 29 y.o.   MRN: [id number]  Chief Complaint  Patient presents with  . Follow-up      ICD-10-CM   1. Panniculitis  M79.3   2. Abdominal pannus  E65     History of Present Illness: Whitney Brandt is a 29 y.o.  female  with a history of panniculitis, weight loss of approximately 100 pounds.  She presents for preoperative evaluation for upcoming procedure, fleur de-lis panniculectomy, scheduled for 07/09/2019 with Dr. Arita Miss.   As a team we have had multiple visits with the patient and her sister to help them obtain official documentation for power of attorney. I had previously referred the patient and her sister to [facility] health patient advocacy for assistance with obtaining power of attorney documentation.  Sister reports that [name] was very helpful and they have obtained these papers.  Official documentation was scanned in the patient's chart today.   The patient has not had problems with anesthesia. No hx of DVT/PE.  No family history of DVT/PE.  No history of bleeding or clotting disorders.  Non-smoker.  Not taking any blood thinners. Patient's sister reports that patient is not a smoker, but she (the sister) is and that she sometimes smokes in the house.  Patient sister and patient both reported that patient tolerated abdominal hysterectomy without much issue.  Sister reported that patient recovered quickly from this procedure.   I was able to talk with Ms. [name] quite a bit today, she was very talkative and was able to answer questions by verbalizing yes, no and even forming complete sentences.  She reported that she did not have any nausea, vomiting, chest pain, shortness of breath, weakness, fatigue.   Past Medical History: Allergies: No Known Allergies  Current Medications:  Current Outpatient Medications:  .  Acetaminophen-Codeine (TYLENOL/CODEINE #3) 300-30 MG tablet, Take 1 tablet by mouth every 6 (six)  hours as needed for pain., Disp: 30 tablet, Rfl: 1 .  chlorhexidine (HIBICLENS) 4 % external liquid, , Disp: , Rfl:  .  fluticasone (FLONASE) 50 MCG/ACT nasal spray, Place 2 sprays into both nostrils daily., Disp: 16 g, Rfl: 6 .  ibuprofen (ADVIL) 800 MG tablet, Take 1 tab po three times a day AS NEEDED FOR PAIN, Disp: 90 tablet, Rfl: 1 .  levocetirizine (XYZAL) 5 MG tablet, Take 1 tab po day for allergic rhinitis, Disp: 30 tablet, Rfl: 3 .  mupirocin ointment (BACTROBAN) 2 %, Apply to affected areas twice daily as needed, Disp: 44 g, Rfl: 3 .  nystatin (MYCOSTATIN/NYSTOP) powder, Apply topically 4 (four) times daily., Disp: 60 g, Rfl: 2 .  nystatin ointment (MYCOSTATIN), Apply 1 application topically 3 (three) times daily., Disp: 90 g, Rfl: 3 .  phentermine (ADIPEX-P) 37.5 MG tablet, Take 1 tablet (37.5 mg total) by mouth daily before breakfast., Disp: 30 tablet, Rfl: 1  Past Medical Problems: Past Medical History:  Diagnosis Date  . Diabetes mellitus without complication [facility])     Past Surgical History: Past Surgical History:  Procedure Laterality Date  . ABDOMINAL HYSTERECTOMY      Social History: Social History   Socioeconomic History  . Marital status: Single    Spouse name: Not on file  . Number of children: Not on file  . Years of education: Not on file  . Highest education level: Not on file  Occupational History  . Not on file  Tobacco Use  . Smoking  status: Never Smoker  . Smokeless tobacco: Never Used  Substance and Sexual Activity  . Alcohol use: No  . Drug use: No  . Sexual activity: Never  Other Topics Concern  . Not on file  Social History Narrative  . Not on file   Social Determinants of Health   Financial Resource Strain:   . Difficulty of Paying Living Expenses:   Food Insecurity:   . Worried About Programme researcher, broadcasting/film/video in the Last Year:   . Barista in the Last Year:   Transportation Needs:   . Freight forwarder (Medical):   Marland Kitchen Lack of  Transportation (Non-Medical):   Physical Activity:   . Days of Exercise per Week:   . Minutes of Exercise per Session:   Stress:   . Feeling of Stress :   Social Connections:   . Frequency of Communication with Friends and Family:   . Frequency of Social Gatherings with Friends and Family:   . Attends Religious Services:   . Active Member of Clubs or Organizations:   . Attends Banker Meetings:   Marland Kitchen Marital Status:   Intimate Partner Violence:   . Fear of Current or Ex-Partner:   . Emotionally Abused:   Marland Kitchen Physically Abused:   . Sexually Abused:     Family History: Family History  Problem Relation Age of Onset  . Diabetes Mother   . Hypertension Mother   . Thyroid disease Mother     Review of Systems: Review of Systems  Constitutional: Negative for chills, diaphoresis, fever and malaise/fatigue.  Respiratory: Negative.   Cardiovascular: Negative.   Gastrointestinal: Positive for constipation. Negative for abdominal pain, nausea and vomiting.  Genitourinary: Negative.   Musculoskeletal: Negative.   Skin: Positive for rash. Negative for itching.  Neurological: Negative.     Physical Exam: Vital Signs BP 114/77 (BP Location: Left Arm, Patient Position: Sitting, Cuff Size: Normal)   Pulse 96   Temp 97.7 F (36.5 C) (Temporal)   Ht 5\' 6"  (1.676 m)   Wt 182 lb (82.6 kg)   SpO2 96%   BMI 29.38 kg/m  Physical Exam Exam conducted with a chaperone present.  Constitutional:      General: [name] not in acute distress.    Appearance: Normal appearance. [name] not ill-appearing.  HENT:     Head: Normocephalic and atraumatic.  Eyes:     Pupils: Pupils are equal, round Neck:     Musculoskeletal: Normal range of motion.  Cardiovascular:     Rate and Rhythm: Normal rate and regular rhythm.     Pulses: Normal pulses.     Heart sounds: Normal heart sounds. No murmur.  Pulmonary:     Effort: Pulmonary effort is normal. No respiratory distress.     Breath  sounds: Normal breath sounds. No wheezing.  Abdominal:     General: Abdomen is flat. There is no distension.     Palpations: Abdomen is soft.     Tenderness: There is no abdominal tenderness.  Musculoskeletal: Normal range of motion.  Skin:    General: Skin is warm and dry.     Findings: No erythema or rash.  Neurological:     General: No focal deficit present.     Mental Status: [name] alert and oriented to person, place, and time. Mental status is at baseline.     Motor: No weakness.  Psychiatric:        Mood and Affect: Mood normal.  Behavior: Behavior normal.   Assessment/Plan: The patient is scheduled for fleur-de-lis panniculectomy with Dr. Claudia Desanctis.  Risks, benefits, and alternatives of procedure discussed, questions answered and consent obtained.    Patient and sister were previously provided with general surgical risk consent form at last appointment for them to take home and read.  Patient was also provided with additional copy today prior to their appointment with me.  Patient and sister were provided adequate time to read through the entire consent form initial, sign, date and fill out additional information in regards to surgical risk.  During our appointment I discussed the risks with patient and sister.  The risks we discussed included but was not limited to bleeding, infection, damage to surrounding structures, pain, need for additional procedures, DVT/PE, cardiac and pulmonary complications.  Patient provided with additional copy of consent form.  Prescription sent to pharmacy for postop pain control and nausea/vomiting.  Mammogram: N/A Smoking status: Non-smoker, family member smoking in house Caprini score: 3-4, moderate, recommend mechanical prophylaxis, early ambulation.  Patient is scheduled for Covid test. Plan to stay in hospital postop for one night for observation.  During our appointment today I spent greater than 30 minutes discussing the power of  attorney situation, procedure, planning surgical date and time, discussing risks associated with surgery and discussing the overall plan with the patient and her sister  Electronically signed by: Charlies Constable, PA-C 07/03/2019 2:50 PM

## 2019-07-05 ENCOUNTER — Other Ambulatory Visit (HOSPITAL_COMMUNITY)
Admission: RE | Admit: 2019-07-05 | Discharge: 2019-07-05 | Disposition: A | Discharge status: Home / Self Care | Payer: Medicaid Other | Source: Ambulatory Visit | Attending: Plastic Surgery | Admitting: Plastic Surgery

## 2019-07-05 DIAGNOSIS — Z01812 Encounter for preprocedural laboratory examination: Secondary | ICD-10-CM | POA: Diagnosis present

## 2019-07-05 DIAGNOSIS — Z20822 Contact with and (suspected) exposure to covid-19: Secondary | ICD-10-CM | POA: Insufficient documentation

## 2019-07-05 LAB — SARS CORONAVIRUS 2 (TAT 6-24 HRS): SARS Coronavirus 2: NEGATIVE

## 2019-07-08 ENCOUNTER — Other Ambulatory Visit: Payer: Self-pay

## 2019-07-08 ENCOUNTER — Encounter (HOSPITAL_COMMUNITY): Payer: Self-pay | Admitting: Plastic Surgery

## 2019-07-08 NOTE — Progress Notes (Signed)
SDW-pre-op call completed by pt sister, [name], [place]. Sister denies that pt C/O SOB and chest pain. Sister denies that pt is under the care of a cardiologist. Sister stated that pt had a stress test> 10 years ago but denies that pt had an echo and cardiac cath. Sister denies that pt had an EKG and chest x ray in the last year. Sister denies recent labs. Sister denies that pt currently has diabetes. Sister stated " she has not had diabetes in 5 years since she lost weight. "  Sister stated that pt last dose of Phentermine was 07/02/2019. Sister made aware to have pt stop taking  Aspirin (unless otherwise advised by surgeon), vitamins, fish oil and herbal medications. Do not take any NSAIDs ie: Ibuprofen, Advil, Naproxen (Aleve), Motrin, BC and Goody Powder. Sister reminded to have pt quarantine. Sister verbalized understanding of all pre-op instructions.

## 2019-07-09 ENCOUNTER — Observation Stay (HOSPITAL_COMMUNITY)
Admission: RE | Admit: 2019-07-09 | Discharge: 2019-07-10 | Disposition: A | Discharge status: Home / Self Care | Payer: Medicaid Other | Attending: Plastic Surgery | Admitting: Plastic Surgery

## 2019-07-09 ENCOUNTER — Ambulatory Visit (HOSPITAL_COMMUNITY): Payer: Medicaid Other | Admitting: Certified Registered"

## 2019-07-09 ENCOUNTER — Encounter (HOSPITAL_COMMUNITY): Payer: Self-pay | Admitting: Plastic Surgery

## 2019-07-09 ENCOUNTER — Encounter (HOSPITAL_COMMUNITY)
Admission: RE | Disposition: A | Discharge status: Home / Self Care | Payer: Self-pay | Source: Home / Self Care | Attending: Plastic Surgery

## 2019-07-09 DIAGNOSIS — E65 Localized adiposity: Secondary | ICD-10-CM

## 2019-07-09 DIAGNOSIS — E119 Type 2 diabetes mellitus without complications: Secondary | ICD-10-CM | POA: Diagnosis not present

## 2019-07-09 DIAGNOSIS — M793 Panniculitis, unspecified: Principal | ICD-10-CM | POA: Insufficient documentation

## 2019-07-09 DIAGNOSIS — Z79899 Other long term (current) drug therapy: Secondary | ICD-10-CM | POA: Diagnosis not present

## 2019-07-09 DIAGNOSIS — Z9889 Other specified postprocedural states: Secondary | ICD-10-CM

## 2019-07-09 HISTORY — DX: Panniculitis, unspecified: M79.3

## 2019-07-09 HISTORY — PX: PANNICULECTOMY: SHX5360

## 2019-07-09 HISTORY — DX: Presence of spectacles and contact lenses: Z97.3

## 2019-07-09 HISTORY — DX: Unspecified intellectual disabilities: F79

## 2019-07-09 HISTORY — DX: Gastro-esophageal reflux disease without esophagitis: K21.9

## 2019-07-09 LAB — CBC
HCT: 45.5 % (ref 36.0–46.0)
Hemoglobin: 14.8 g/dL (ref 12.0–15.0)
MCH: 29.1 pg (ref 26.0–34.0)
MCHC: 32.5 g/dL (ref 30.0–36.0)
MCV: 89.6 fL (ref 80.0–100.0)
Platelets: 213 K/uL (ref 150–400)
RBC: 5.08 MIL/uL (ref 3.87–5.11)
RDW: 12.2 % (ref 11.5–15.5)
WBC: 6.9 K/uL (ref 4.0–10.5)
nRBC: 0 % (ref 0.0–0.2)

## 2019-07-09 LAB — BASIC METABOLIC PANEL WITH GFR
Anion gap: 8 (ref 5–15)
BUN: 13 mg/dL (ref 6–20)
CO2: 26 mmol/L (ref 22–32)
Calcium: 9.6 mg/dL (ref 8.9–10.3)
Chloride: 105 mmol/L (ref 98–111)
Creatinine, Ser: 0.71 mg/dL (ref 0.44–1.00)
GFR calc Af Amer: >60 mL/min
GFR calc non Af Amer: >60 mL/min
Glucose, Bld: 89 mg/dL (ref 70–99)
Potassium: 3.9 mmol/L (ref 3.5–5.1)
Sodium: 139 mmol/L (ref 135–145)

## 2019-07-09 LAB — GLUCOSE, CAPILLARY: Glucose-Capillary: 115 mg/dL — ABNORMAL HIGH (ref 70–99)

## 2019-07-09 SURGERY — PANNICULECTOMY
Anesthesia: General | Site: Abdomen

## 2019-07-09 MED ORDER — SODIUM BICARBONATE 4 % IV SOLN
INTRAVENOUS | Status: DC
  Filled 2019-07-09: qty 50

## 2019-07-09 MED ORDER — MIDAZOLAM HCL 2 MG/2ML IJ SOLN
INTRAMUSCULAR | Status: AC
  Filled 2019-07-09: qty 2

## 2019-07-09 MED ORDER — SODIUM BICARBONATE 4 % IV SOLN
INTRAVENOUS | Status: DC | PRN
  Administered 2019-07-09 (×2): 1000 mL via INTRAMUSCULAR

## 2019-07-09 MED ORDER — HYDROCODONE-ACETAMINOPHEN 5-325 MG PO TABS
1.0000 | ORAL_TABLET | ORAL | Status: DC | PRN
  Administered 2019-07-09 – 2019-07-10 (×4): 2 via ORAL
  Filled 2019-07-09 (×4): qty 2

## 2019-07-09 MED ORDER — LIDOCAINE 2% (20 MG/ML) 5 ML SYRINGE
INTRAMUSCULAR | Status: AC
  Filled 2019-07-09: qty 5

## 2019-07-09 MED ORDER — ROCURONIUM BROMIDE 10 MG/ML (PF) SYRINGE
PREFILLED_SYRINGE | INTRAVENOUS | Status: AC
  Filled 2019-07-09: qty 10

## 2019-07-09 MED ORDER — CEFAZOLIN SODIUM-DEXTROSE 2-4 GM/100ML-% IV SOLN
INTRAVENOUS | Status: AC
  Filled 2019-07-09: qty 100

## 2019-07-09 MED ORDER — SUGAMMADEX SODIUM 200 MG/2ML IV SOLN
INTRAVENOUS | Status: DC | PRN
  Administered 2019-07-09: 200 mg via INTRAVENOUS

## 2019-07-09 MED ORDER — CEFAZOLIN SODIUM-DEXTROSE 2-4 GM/100ML-% IV SOLN
2.0000 g | INTRAVENOUS | Status: AC
  Administered 2019-07-09: 14:00:00 2 g via INTRAVENOUS

## 2019-07-09 MED ORDER — MIDAZOLAM HCL 2 MG/2ML IJ SOLN
INTRAMUSCULAR | Status: AC
  Administered 2019-07-09: 1 mg via INTRAVENOUS
  Filled 2019-07-09: qty 2

## 2019-07-09 MED ORDER — ONDANSETRON HCL 4 MG/2ML IJ SOLN
INTRAMUSCULAR | Status: DC | PRN
  Administered 2019-07-09: 4 mg via INTRAVENOUS

## 2019-07-09 MED ORDER — BUPIVACAINE LIPOSOME 1.3 % IJ SUSP: INTRAMUSCULAR | Status: DC | PRN

## 2019-07-09 MED ORDER — FENTANYL CITRATE (PF) 100 MCG/2ML IJ SOLN
INTRAMUSCULAR | Status: DC | PRN
  Administered 2019-07-09 (×3): 100 ug via INTRAVENOUS
  Administered 2019-07-09: 50 ug via INTRAVENOUS

## 2019-07-09 MED ORDER — ONDANSETRON HCL 4 MG/2ML IJ SOLN
INTRAMUSCULAR | Status: AC
  Filled 2019-07-09: qty 6

## 2019-07-09 MED ORDER — OXYCODONE HCL 5 MG/5ML PO SOLN: 5.0000 mg | Freq: Once | ORAL | Status: DC | PRN

## 2019-07-09 MED ORDER — EPINEPHRINE PF 1 MG/ML IJ SOLN
INTRAMUSCULAR | Status: AC
  Filled 2019-07-09: qty 1

## 2019-07-09 MED ORDER — ONDANSETRON HCL 4 MG/2ML IJ SOLN: 4.0000 mg | Freq: Once | INTRAMUSCULAR | Status: DC | PRN

## 2019-07-09 MED ORDER — ROCURONIUM BROMIDE 10 MG/ML (PF) SYRINGE
PREFILLED_SYRINGE | INTRAVENOUS | Status: AC
  Filled 2019-07-09: qty 20

## 2019-07-09 MED ORDER — PROPOFOL 10 MG/ML IV BOLUS
INTRAVENOUS | Status: AC
  Filled 2019-07-09: qty 20

## 2019-07-09 MED ORDER — DEXAMETHASONE SODIUM PHOSPHATE 10 MG/ML IJ SOLN
INTRAMUSCULAR | Status: DC | PRN
  Administered 2019-07-09: 10 mg via INTRAVENOUS

## 2019-07-09 MED ORDER — ACETAMINOPHEN 325 MG PO TABS: 325.0000 mg | ORAL_TABLET | ORAL | Status: DC | PRN

## 2019-07-09 MED ORDER — FENTANYL CITRATE (PF) 100 MCG/2ML IJ SOLN
25.0000 ug | INTRAMUSCULAR | Status: DC | PRN
  Administered 2019-07-09: 50 ug via INTRAVENOUS
  Administered 2019-07-09: 25 ug via INTRAVENOUS

## 2019-07-09 MED ORDER — ONDANSETRON HCL 4 MG/2ML IJ SOLN: 4.0000 mg | Freq: Four times a day (QID) | INTRAMUSCULAR | Status: DC | PRN

## 2019-07-09 MED ORDER — FENTANYL CITRATE (PF) 100 MCG/2ML IJ SOLN
INTRAMUSCULAR | Status: AC
  Filled 2019-07-09: qty 2

## 2019-07-09 MED ORDER — SODIUM BICARBONATE 4 % IV SOLN
Freq: Once | INTRAVENOUS | Status: DC
  Filled 2019-07-09: qty 50

## 2019-07-09 MED ORDER — FENTANYL CITRATE (PF) 250 MCG/5ML IJ SOLN
INTRAMUSCULAR | Status: AC
  Filled 2019-07-09: qty 5

## 2019-07-09 MED ORDER — LACTATED RINGERS IV SOLN: INTRAVENOUS | Status: DC | PRN

## 2019-07-09 MED ORDER — OXYCODONE HCL 5 MG PO TABS: 5.0000 mg | ORAL_TABLET | Freq: Once | ORAL | Status: DC | PRN

## 2019-07-09 MED ORDER — DEXAMETHASONE SODIUM PHOSPHATE 10 MG/ML IJ SOLN
INTRAMUSCULAR | Status: AC
  Filled 2019-07-09: qty 2

## 2019-07-09 MED ORDER — MIDAZOLAM HCL 2 MG/2ML IJ SOLN: 1.0000 mg | Freq: Once | INTRAMUSCULAR | Status: AC

## 2019-07-09 MED ORDER — MIDAZOLAM HCL 5 MG/5ML IJ SOLN
INTRAMUSCULAR | Status: DC | PRN
  Administered 2019-07-09: 2 mg via INTRAVENOUS

## 2019-07-09 MED ORDER — 0.9 % SODIUM CHLORIDE (POUR BTL) OPTIME
TOPICAL | Status: DC | PRN
  Administered 2019-07-09 (×2): 1000 mL

## 2019-07-09 MED ORDER — ROCURONIUM BROMIDE 50 MG/5ML IV SOSY
PREFILLED_SYRINGE | INTRAVENOUS | Status: DC | PRN
  Administered 2019-07-09: 60 mg via INTRAVENOUS
  Administered 2019-07-09: 40 mg via INTRAVENOUS
  Administered 2019-07-09: 50 mg via INTRAVENOUS

## 2019-07-09 MED ORDER — LACTATED RINGERS IV SOLN: INTRAVENOUS | Status: DC

## 2019-07-09 MED ORDER — BUPIVACAINE HCL (PF) 0.5 % IJ SOLN
INTRAMUSCULAR | Status: AC
  Filled 2019-07-09: qty 30

## 2019-07-09 MED ORDER — LIDOCAINE 2% (20 MG/ML) 5 ML SYRINGE
INTRAMUSCULAR | Status: DC | PRN
  Administered 2019-07-09: 40 mg via INTRAVENOUS

## 2019-07-09 MED ORDER — ONDANSETRON 4 MG PO TBDP: 4.0000 mg | ORAL_TABLET | Freq: Four times a day (QID) | ORAL | Status: DC | PRN

## 2019-07-09 MED ORDER — EPHEDRINE SULFATE-NACL 50-0.9 MG/10ML-% IV SOSY
PREFILLED_SYRINGE | INTRAVENOUS | Status: DC | PRN
  Administered 2019-07-09: 10 mg via INTRAVENOUS

## 2019-07-09 MED ORDER — PROPOFOL 10 MG/ML IV BOLUS
INTRAVENOUS | Status: DC | PRN
  Administered 2019-07-09: 180 mg via INTRAVENOUS
  Administered 2019-07-09: 20 mg via INTRAVENOUS

## 2019-07-09 SURGICAL SUPPLY — 65 items
APPLIER CLIP 9.375 MED OPEN (MISCELLANEOUS) ×3
BINDER ABDOMINAL 12 SM 30-45 (SOFTGOODS) ×1 IMPLANT
BINDER ABDOMINAL 12 XL 75-84 (SOFTGOODS) ×2 IMPLANT
BIOPATCH RED 1 DISK 7.0 (GAUZE/BANDAGES/DRESSINGS) ×2 IMPLANT
BIOPATCH RED 1IN DISK 7.0MM (GAUZE/BANDAGES/DRESSINGS) ×2
BLADE CLIPPER SURG (BLADE) IMPLANT
BLADE SURG 11 STRL SS (BLADE) IMPLANT
BLADE SURG 15 STRL LF DISP TIS (BLADE) IMPLANT
BLADE SURG 15 STRL SS (BLADE)
CANISTER SUCT 3000ML PPV (MISCELLANEOUS) ×3 IMPLANT
CHLORAPREP W/TINT 26 (MISCELLANEOUS) ×7 IMPLANT
CLIP APPLIE 9.375 MED OPEN (MISCELLANEOUS) ×1 IMPLANT
CLOSURE WOUND 1/2 X4 (GAUZE/BANDAGES/DRESSINGS) ×4
CONT SPEC PATH 64OZ SNAP LID (MISCELLANEOUS) ×3 IMPLANT
COVER WAND RF STERILE (DRAPES) ×1 IMPLANT
DERMABOND ADVANCED (GAUZE/BANDAGES/DRESSINGS) ×2
DERMABOND ADVANCED .7 DNX12 (GAUZE/BANDAGES/DRESSINGS) ×1 IMPLANT
DRAIN CHANNEL 15F RND FF W/TCR (WOUND CARE) ×6 IMPLANT
DRAIN CHANNEL 19F RND (DRAIN) IMPLANT
DRAPE TOP ARMCOVERS (MISCELLANEOUS) ×3 IMPLANT
DRAPE U-SHAPE 76X120 STRL (DRAPES) ×6 IMPLANT
DRAPE UTILITY XL STRL (DRAPES) ×3 IMPLANT
DRSG TEGADERM 4X4.75 (GAUZE/BANDAGES/DRESSINGS) ×4 IMPLANT
ELECT BLADE 4.0 EZ CLEAN MEGAD (MISCELLANEOUS) ×3
ELECT COATED BLADE 2.86 ST (ELECTRODE) ×3 IMPLANT
ELECT REM PT RETURN 9FT ADLT (ELECTROSURGICAL) ×3
ELECTRODE BLDE 4.0 EZ CLN MEGD (MISCELLANEOUS) ×1 IMPLANT
ELECTRODE REM PT RTRN 9FT ADLT (ELECTROSURGICAL) ×1 IMPLANT
EVACUATOR SILICONE 100CC (DRAIN) ×6 IMPLANT
GAUZE SPONGE 4X4 12PLY STRL (GAUZE/BANDAGES/DRESSINGS) ×5 IMPLANT
GAUZE SPONGE 4X4 12PLY STRL LF (GAUZE/BANDAGES/DRESSINGS) ×2 IMPLANT
GAUZE XEROFORM 1X8 LF (GAUZE/BANDAGES/DRESSINGS) ×3 IMPLANT
GLOVE BIOGEL M STRL SZ7.5 (GLOVE) ×3 IMPLANT
GLOVE BIOGEL PI IND STRL 8 (GLOVE) ×1 IMPLANT
GLOVE BIOGEL PI INDICATOR 8 (GLOVE) ×2
GOWN STRL REUS W/ TWL LRG LVL3 (GOWN DISPOSABLE) ×2 IMPLANT
GOWN STRL REUS W/TWL LRG LVL3 (GOWN DISPOSABLE) ×12
KIT BASIN OR (CUSTOM PROCEDURE TRAY) ×3 IMPLANT
NDL HYPO 25GX1X1/2 BEV (NEEDLE) ×1 IMPLANT
NEEDLE HYPO 25GX1X1/2 BEV (NEEDLE) ×3 IMPLANT
NS IRRIG 1000ML POUR BTL (IV SOLUTION) ×5 IMPLANT
PACK GENERAL/GYN (CUSTOM PROCEDURE TRAY) ×3 IMPLANT
PAD ABD 8X10 STRL (GAUZE/BANDAGES/DRESSINGS) ×5 IMPLANT
PENCIL SMOKE EVACUATOR (MISCELLANEOUS) ×3 IMPLANT
PIN SAFETY STERILE (MISCELLANEOUS) ×3 IMPLANT
SHEET MEDIUM DRAPE 40X70 STRL (DRAPES) ×6 IMPLANT
SLEEVE SCD COMPRESS KNEE MED (MISCELLANEOUS) ×1 IMPLANT
SPONGE LAP 18X18 RF (DISPOSABLE) ×9 IMPLANT
STAPLER INSORB 30 2030 C-SECTI (MISCELLANEOUS) ×5 IMPLANT
STAPLER VISISTAT 35W (STAPLE) ×4 IMPLANT
STRIP CLOSURE SKIN 1/2X4 (GAUZE/BANDAGES/DRESSINGS) ×4 IMPLANT
SUT ETHILON 2 0 FS 18 (SUTURE) ×2 IMPLANT
SUT ETHILON 3 0 FSLX (SUTURE) ×4 IMPLANT
SUT MNCRL AB 4-0 PS2 18 (SUTURE) ×6 IMPLANT
SUT PDS AB 0 CT 36 (SUTURE) ×2 IMPLANT
SUT PDS AB 2-0 CT2 27 (SUTURE) IMPLANT
SUT VIC AB 2-0 CTX 36 (SUTURE) ×12 IMPLANT
SUT VLOC 90 P-14 23 (SUTURE) ×7 IMPLANT
SYR BULB IRRIGATION 50ML (SYRINGE) ×3 IMPLANT
SYR CONTROL 10ML LL (SYRINGE) ×3 IMPLANT
TAPE CLOTH SURG 6X10 WHT LF (GAUZE/BANDAGES/DRESSINGS) ×2 IMPLANT
TRAY FOLEY W/BAG SLVR 16FR (SET/KITS/TRAYS/PACK) ×3
TRAY FOLEY W/BAG SLVR 16FR ST (SET/KITS/TRAYS/PACK) IMPLANT
TUBING AUTOFUSE DISPOSABLE (MISCELLANEOUS) ×2 IMPLANT
UNDERPAD 30X36 HEAVY ABSORB (UNDERPADS AND DIAPERS) ×3 IMPLANT

## 2019-07-09 NOTE — Transfer of Care (Signed)
Immediate Anesthesia Transfer of Care Note  Patient: Whitney Brandt  Procedure(s) Performed: PANNICULECTOMY (N/A Abdomen)  Patient Location: PACU  Anesthesia Type:General  Level of Consciousness: awake, alert  and patient cooperative  Airway & Oxygen Therapy: Patient Spontanous Breathing  Post-op Assessment: Report given to RN and Post -op Vital signs reviewed and stable  Post vital signs: Reviewed and stable  Last Vitals:  Vitals Value Taken Time  BP    Temp    Pulse    Resp    SpO2      Last Pain:  Vitals:   07/09/19 1040  TempSrc:   PainSc: 0-No pain      Patients Stated Pain Goal: 2 (07/09/19 1040)  Complications: No apparent anesthesia complications

## 2019-07-09 NOTE — Brief Op Note (Signed)
07/09/2019  4:46 PM  PATIENT:  Whitney Brandt  29 y.o. female  PRE-OPERATIVE DIAGNOSIS:  Panniculitis  POST-OPERATIVE DIAGNOSIS:  Panniculitis  PROCEDURE:  Procedure(s) with comments: PANNICULECTOMY (N/A) - 3 hours  SURGEON:  Surgeon(s) and Role:    * Mizuki Hoel, Wendy Poet, MD - Primary  PHYSICIAN ASSISTANT: None  ASSISTANTS: Bonita Cox, RNFA   ANESTHESIA:   general  EBL:  100   BLOOD ADMINISTERED:none  DRAINS: (2) Jackson-Pratt drain(s) with closed bulb suction in the abdomen   LOCAL MEDICATIONS USED:  LIDOCAINE   SPECIMEN:  Source of Specimen:  Pannus  DISPOSITION OF SPECIMEN:  PATHOLOGY  COUNTS:  YES  TOURNIQUET:  * No tourniquets in log *  DICTATION: .Dragon Dictation  PLAN OF CARE: Admit for overnight observation  PATIENT DISPOSITION:  PACU - hemodynamically stable.   Delay start of Pharmacological VTE agent (>24hrs) due to surgical blood loss or risk of bleeding: not applicable

## 2019-07-09 NOTE — Anesthesia Preprocedure Evaluation (Addendum)
Anesthesia Evaluation  Patient identified by MRN, date of birth, ID band Patient awake    Reviewed: Allergy & Precautions, NPO status , Patient's Chart, lab work & pertinent test results  Airway Mallampati: II  TM Distance: >3 FB Neck ROM: Full    Dental  (+) Teeth Intact, Dental Advisory Given   Pulmonary    breath sounds clear to auscultation       Cardiovascular  Rhythm:Regular Rate:Normal     Neuro/Psych    GI/Hepatic   Endo/Other  diabetes  Renal/GU      Musculoskeletal   Abdominal   Peds  Hematology   Anesthesia Other Findings   Reproductive/Obstetrics                             Anesthesia Physical Anesthesia Plan  ASA: II  Anesthesia Plan: General   Post-op Pain Management:    Induction: Intravenous  PONV Risk Score and Plan: Ondansetron and Dexamethasone  Airway Management Planned: Oral ETT  Additional Equipment:   Intra-op Plan:   Post-operative Plan: Extubation in OR  Informed Consent: I have reviewed the patients History and Physical, chart, labs and discussed the procedure including the risks, benefits and alternatives for the proposed anesthesia with the patient or authorized representative who has indicated his/her understanding and acceptance.     Dental advisory given  Plan Discussed with: CRNA and Anesthesiologist  Anesthesia Plan Comments:         Anesthesia Quick Evaluation  

## 2019-07-09 NOTE — Anesthesia Postprocedure Evaluation (Signed)
Anesthesia Post Note  Patient: Whitney Brandt  Procedure(s) Performed: PANNICULECTOMY (N/A Abdomen)     Patient location during evaluation: PACU Anesthesia Type: General Level of consciousness: awake and alert Pain management: pain level controlled Vital Signs Assessment: post-procedure vital signs reviewed and stable Respiratory status: spontaneous breathing, nonlabored ventilation and respiratory function stable Cardiovascular status: blood pressure returned to baseline and stable Postop Assessment: no apparent nausea or vomiting Anesthetic complications: no    Last Vitals:  Vitals:   07/09/19 1707 07/09/19 1723  BP: 125/79 125/79  Pulse: 62 (!) 114  Resp: 10 (!) 22  Temp: 36.9 C   SpO2: 93% 100%    Last Pain:  Vitals:   07/09/19 1707  TempSrc:   PainSc: 10-Worst pain ever                 Anwen Cannedy,W. EDMOND

## 2019-07-09 NOTE — Anesthesia Procedure Notes (Signed)
Procedure Name: Intubation Date/Time: 07/09/2019 1:27 PM Performed by: Rosiland Oz, CRNA Pre-anesthesia Checklist: Patient identified, Emergency Drugs available, Suction available, Patient being monitored and Timeout performed Patient Re-evaluated:Patient Re-evaluated prior to induction Oxygen Delivery Method: Circle system utilized Preoxygenation: Pre-oxygenation with 100% oxygen Induction Type: IV induction Ventilation: Mask ventilation without difficulty Laryngoscope Size: Miller and 2 Grade View: Grade I Tube type: Oral Tube size: 7.0 mm Number of attempts: 1 Airway Equipment and Method: Stylet Placement Confirmation: ETT inserted through vocal cords under direct vision,  positive ETCO2 and breath sounds checked- equal and bilateral Secured at: 21 cm Tube secured with: Tape Dental Injury: Teeth and Oropharynx as per pre-operative assessment

## 2019-07-09 NOTE — Op Note (Signed)
Operative Note   DATE OF OPERATION: 07/09/2019  SURGICAL DEPARTMENT: Plastic Surgery  PREOPERATIVE DIAGNOSES: Panniculitis  POSTOPERATIVE DIAGNOSES:  same  PROCEDURE: Fleur-de-lis panniculectomy  SURGEON: Ancil Linsey, MD  ASSISTANT: Enedina Finner  ANESTHESIA:  General.   COMPLICATIONS: None.   INDICATIONS FOR PROCEDURE:  The patient, Whitney Brandt is a 29 y.o. female born on 1991/02/20, is here for treatment of panniculitis MRN: [id number]  CONSENT:  Informed consent was obtained directly from the patient. Risks, benefits and alternatives were fully discussed. Specific risks including but not limited to bleeding, infection, hematoma, seroma, scarring, pain, contracture, asymmetry, wound healing problems, and need for further surgery were all discussed. The patient did have an ample opportunity to have questions answered to satisfaction.   DESCRIPTION OF PROCEDURE:  The patient was taken to the operating room. SCDs were placed and Ancef General antibiotics were given.  General anesthesia was administered.  The patient's operative site was prepped and draped in a sterile fashion. A time out was performed and all information was confirmed to be correct.  I started by tailor tacking the vertical skin excision to confirm I will be able to close it.  I then planned the inferior and superior transverse incisions as well.  This was followed by 2 L of tumescent that was and flu infused into the abdominal wall soft tissues.  After letting this set I made the incisions for the vertical excision with a 10 blade.  I then dissected down to the abdominal wall.  I then brought up the bellybutton and dissected the stalk down to the abdominal wall.  I then made the inferior incision and undermined superiorly for a few centimeters.  The vertical specimen was then excised and passed off the field.  Meticulous hemostasis was obtained over that portion in the vertical portion was stapled closed.  I was  then able to plan the superior transverse incision.  This was made with a 10 blade and transverse excisions on the left and right side were performed and passed off the field.  Minimal undermining was performed for the skin that was last left in place.  Meticulous hemostasis was obtained in the transverse wound was stapled closed.  I checked her for contour which was reasonable.  JP drains were then placed on either side and secured with a nylon suture.  Closure was done with interrupted buried 2-0 Vicryl sutures for Scarpa's layer, an insorb stapler, and a running 3 oh V-Loc suture.  The bellybutton was closed with buried Monocryl and a running Monocryl sutures superficially.  Total weight of the specimen was 6242 g.  [name] then covered with Steri-Strips, 4 x 4's, ABDs and abdominal binder.  Xeroform was placed over the umbilicus.  The patient tolerated the procedure well.  There were no complications. The patient was allowed to wake from anesthesia, extubated and taken to the recovery room in satisfactory condition.

## 2019-07-09 NOTE — Interval H&P Note (Signed)
History and Physical Interval Note:  07/09/2019 1:05 PM  Whitney Brandt  has presented today for surgery, with the diagnosis of Panniculitis.  The various methods of treatment have been discussed with the patient and family. After consideration of risks, benefits and other options for treatment, the patient has consented to  Procedure(s) with comments: PANNICULECTOMY (N/A) - 3 hours as a surgical intervention.  The patient's history has been reviewed, patient examined, no change in status, stable for surgery.  I have reviewed the patient's chart and labs.  Questions were answered to the patient's satisfaction.     Allena Napoleon

## 2019-07-10 DIAGNOSIS — M793 Panniculitis, unspecified: Secondary | ICD-10-CM | POA: Diagnosis not present

## 2019-07-10 NOTE — Plan of Care (Signed)

## 2019-07-10 NOTE — Progress Notes (Signed)
Patient seen in her hospital room.  [name] doing well with minimal pain.  She has been up out of bed and walking and wants to walk more.  Her vital signs of been fine.  The left drain has put out around 200 overnight but is slowing down and becoming more clear.  On exam all of her incisions look fine and should not see any subcutaneous fluid or hematoma.  Should be okay to go home with follow-up next week.  She has been given instructions on keeping her head of bed elevated and limiting strenuous activity.  She can shower anytime starting the next couple days.

## 2019-07-10 NOTE — Discharge Instructions (Signed)
Activity: As tolerated, but avoid strenuous activity until follow up visit.  Diet: Regular  Wound Care: Keep dressing clean & dry for 2 days.  After that you can shower normally.  Redress the wound as needed for comfort.  Empty and record JP drain output and bring to your follow up visit.    Special Instructions:   Keep head and back slightly elevate to flex the hips and take tension off the incision.  Can redress the incisions as needed after showers but try to keep the abdominal binder on as much as possible to compress the surgical site.  Call our office if any unusual problems occur such as pain, excessive bleeding, unrelieved nausea/vomiting, fever &/or chills.  Follow-up appointment: Scheduled for next week.

## 2019-07-10 NOTE — Progress Notes (Signed)
Raymon Mutton to be D/C'd  per MD order. Discussed with the patient and all questions fully answered.  VSS, Skin clean, dry and intact without evidence of skin break down, no evidence of skin tears noted.  IV catheter discontinued intact. Site without signs and symptoms of complications. Dressing and pressure applied.  An After Visit Summary was printed and given to the patient. Patient received prescription.  D/c education completed with patient/family including follow up instructions, medication list, d/c activities limitations if indicated, with other d/c instructions as indicated by MD - patient able to verbalize understanding, all questions fully answered.   Patient instructed to return to ED, call 911, or call MD for any changes in condition.   Patient to be escorted via WC, and D/C home via private auto.

## 2019-07-10 NOTE — Discharge Summary (Signed)
Physician Discharge Summary  Patient ID: Whitney Brandt MRN: [id number] DOB/AGE: 30/08/1990 30 y.o.  Admit date: 07/09/2019 Discharge date: 07/10/2019  Admission Diagnoses: Panniculitis  Discharge Diagnoses:  Active Problems:   Status post panniculectomy   Panniculitis   Discharged Condition: good  Hospital Course: Uncomplicated panniculectomy  Consults: None  Significant Diagnostic Studies: None  Treatments: surgery: panniculectomy  Discharge Exam: Blood pressure 117/64, pulse 95, temperature 99.3 F (37.4 C), temperature source Oral, resp. rate 19, height 5\' 6"  (1.676 m), weight 82.6 kg, SpO2 94 %. General appearance: alert  Disposition: Discharge disposition: 01-Home or Self Care       Discharge Instructions    Call MD for:  persistant dizziness or light-headedness   Complete by: As directed    Call MD for:  persistant nausea and vomiting   Complete by: As directed    Call MD for:  redness, tenderness, or signs of infection (pain, swelling, redness, odor or green/yellow discharge around incision site)   Complete by: As directed    Call MD for:  severe uncontrolled pain   Complete by: As directed    Call MD for:  temperature >100.4   Complete by: As directed    Diet - low sodium heart healthy   Complete by: As directed    Increase activity slowly   Complete by: As directed      Allergies as of 07/10/2019   No Known Allergies     Medication List    TAKE these medications   Acetaminophen-Codeine 300-30 MG tablet Commonly known as: TYLENOL/CODEINE #3 Take 1 tablet by mouth every 6 (six) hours as needed for pain.   fluticasone 50 MCG/ACT nasal spray Commonly known as: FLONASE Place 2 sprays into both nostrils daily. What changed:   when to take this  reasons to take this   ibuprofen 800 MG tablet Commonly known as: ADVIL Take 1 tab po three times a day AS NEEDED FOR PAIN What changed:   how much to take  how to take this  when to take  this  reasons to take this  additional instructions   levocetirizine 5 MG tablet Commonly known as: XYZAL Take 1 tab po day for allergic rhinitis   mupirocin ointment 2 % Commonly known as: BACTROBAN Apply to affected areas twice daily as needed   nystatin ointment Commonly known as: MYCOSTATIN Apply 1 application topically 3 (three) times daily. What changed:   when to take this  reasons to take this   nystatin powder Commonly known as: MYCOSTATIN/NYSTOP Apply topically 4 (four) times daily. What changed:   how much to take  when to take this  reasons to take this   ondansetron 4 MG tablet Commonly known as: Zofran Take 1 tablet (4 mg total) by mouth every 8 (eight) hours as needed for nausea or vomiting.   phentermine 37.5 MG tablet Commonly known as: ADIPEX-P Take 1 tablet (37.5 mg total) by mouth daily before breakfast.        Signed: 09/09/2019 07/10/2019, 11:11 AM

## 2019-07-11 LAB — SURGICAL PATHOLOGY

## 2019-07-15 ENCOUNTER — Telehealth: Payer: Self-pay

## 2019-07-15 NOTE — Telephone Encounter (Signed)
Call to pt's sister [name] (ph# [phone]) re: her concern with pt's pain & requesting refill of Hydrocodone Per [name] pt has no pain meds left- last dose this am She has tried OTC Tylenol (800mg )/6-8 hrs & Ibuprofen (800mg )/8 hrs & has had no relief  Per 02-25-1989- pt is not having any fever/chills or other complications  I consulted with Dr. & he wants to evaluate pt at her f/u appointment on 07/17/19.  Pt's sister asked if she should be seen earlier than her next appointment & per Dr. Arita Miss she should keep her f/u on 07/17/19 He recommended continue the OTC tylenol/ibuprofen regimen according bottle directions

## 2019-07-15 NOTE — Telephone Encounter (Signed)
Patient's sister called requesting refill of hydrocodone. She took her last tablet this morning.

## 2019-07-15 NOTE — Telephone Encounter (Signed)
Patient's sister called back to follow up on status of prescription refill request.

## 2019-07-15 NOTE — Telephone Encounter (Signed)
Call back to pt to inform her that Dr. Arita Miss will see the pt on her f/u this wed 07/17/19 to evaluate her symptoms He does want her to use the tylenol/ibuprofen per directions Whitney Brandt- (pt's sister) understands the plan of care

## 2019-07-17 ENCOUNTER — Other Ambulatory Visit: Payer: Self-pay

## 2019-07-17 ENCOUNTER — Ambulatory Visit (INDEPENDENT_AMBULATORY_CARE_PROVIDER_SITE_OTHER): Payer: Medicaid Other | Admitting: Plastic Surgery

## 2019-07-17 VITALS — BP 106/74 | HR 93 | Temp 96.9°F

## 2019-07-17 DIAGNOSIS — M793 Panniculitis, unspecified: Secondary | ICD-10-CM

## 2019-07-17 MED ORDER — HYDROCODONE-ACETAMINOPHEN 5-325 MG PO TABS: 1.0000 | ORAL_TABLET | Freq: Four times a day (QID) | ORAL | 0 refills | Status: DC | PRN

## 2019-07-17 NOTE — Progress Notes (Signed)
Patient is here postop from a fleur-de-lis panniculectomy.  [name] overall doing well and is happy with her results.  She has still had some general pain in her abdomen but nowhere specifically.  She has between 10 and 20 cc of fluid coming out of each drain with the right putting out less than the left.  On exam her incision looks fine with minimal bruising.  Steri-Strips are still in place and there is no incisional breakdown.  I removed the drain on the right side and will hope to remove the 1 on the left next week.  Patient and her sister are requesting a refill of the pain medication.  I explained that they need to be maximizing the Tylenol and ibuprofen and only take the Norco for breakthrough pain.  They had been taking the Norco scheduled due to a misunderstanding and no now to not take it as often.  I explained if they needed any further pain medication it would have to be of the nonnarcotic variety.  They are fully understanding of follow-up next week.

## 2019-07-22 ENCOUNTER — Other Ambulatory Visit: Payer: Self-pay

## 2019-07-24 ENCOUNTER — Other Ambulatory Visit: Payer: Self-pay

## 2019-07-24 MED ORDER — LEVOCETIRIZINE DIHYDROCHLORIDE 5 MG PO TABS: ORAL_TABLET | ORAL | 3 refills | Status: DC

## 2019-07-25 ENCOUNTER — Encounter: Payer: Self-pay | Admitting: Plastic Surgery

## 2019-07-25 ENCOUNTER — Other Ambulatory Visit: Payer: Self-pay

## 2019-07-25 ENCOUNTER — Ambulatory Visit (INDEPENDENT_AMBULATORY_CARE_PROVIDER_SITE_OTHER): Payer: Medicaid Other | Admitting: Plastic Surgery

## 2019-07-25 VITALS — BP 106/75 | HR 61 | Temp 96.9°F | Ht 66.0 in | Wt 172.4 lb

## 2019-07-25 DIAGNOSIS — M793 Panniculitis, unspecified: Secondary | ICD-10-CM

## 2019-07-25 NOTE — Progress Notes (Signed)
Patient presents 2 weeks postop from a fleur-de-lis panniculectomy.  [name] overall doing well.  [name] not having much pain.  She still has 1 drain in place and it started to drain a darker appearing fluid.  On exam everything looks fine her incisions are healing well.  There is no signs of bruising or swelling or wound healing complication.  The volume coming out of the bald is greater than 30 cc/day at this point.  I will plan to leave the drain in another week.  I reinforced trying to maintain compression which looks like you are doing.  She can take a shower if she wants.  I will plan to see her back next week.

## 2019-07-31 ENCOUNTER — Encounter: Payer: Self-pay | Admitting: Plastic Surgery

## 2019-07-31 ENCOUNTER — Other Ambulatory Visit: Payer: Self-pay

## 2019-07-31 ENCOUNTER — Ambulatory Visit (INDEPENDENT_AMBULATORY_CARE_PROVIDER_SITE_OTHER): Payer: Medicaid Other | Admitting: Plastic Surgery

## 2019-07-31 VITALS — BP 105/73 | HR 104 | Temp 97.7°F | Wt 168.0 lb

## 2019-07-31 DIAGNOSIS — M793 Panniculitis, unspecified: Secondary | ICD-10-CM

## 2019-07-31 NOTE — Progress Notes (Signed)
Patient is now 3 weeks postop from a fleur-de-lis panniculectomy.  She says she is feeling a little bit more fatigued and has a little bit of pain on the left lateral side.  On exam everything looks great with no signs of fluid collection.  All of her incisions are healing normally.  The drain has been putting out less than 30 cc/day and was removed without issue.  Some of her pain may have been related to the drain site.  We discussed gentle compression continuing at this point and we will plan to see her back in 2 weeks time.

## 2019-08-06 ENCOUNTER — Telehealth: Payer: Self-pay | Admitting: Plastic Surgery

## 2019-08-06 NOTE — Telephone Encounter (Signed)
Patient's sister called and lvm to advise that incision oozed yellow liquid and it filled up a towel of hers and was on the floor. She would like a call asap.

## 2019-08-06 NOTE — Progress Notes (Signed)
Patient is a 29 year old female who underwent a fleur-de-lis panniculectomy on 07/09/2019 with Dr. Arita Miss.  Patient has legal guardian.  At last visit on 07/31/2019 patient was healing well with no signs of fluid collection and less than 30 cc/day output in her drain.  Drain was removed.    ~ 4 weeks PO. Patient's sister reports patient has significant draining from her incisions for the last few days. Denies F, N/V.  Upon exam a small hole has opened at the intersection of the horizontal and vertical incisions, tender to palpation with yellow purulent drainage. The remainder of the incisions are healing well, C/D/I. Mild erythema scattered around some portions of the incisions. Drain site has not closed yet; tender to palpation, mild erythema.  Sent RX to pharmacy for 5 days Doxy. Apply vaseline and gauze to drain site and small open wound. May apply vaseline to all of the incisions and surrounding skin as needed for moisture.   Follow up in 1 week.  Call office with any questions/concerns or if condition worsens.   The 21st Century Cures Act was signed into law in 2016 which includes the topic of electronic health records.  This provides immediate access to information in MyChart.  This includes consultation notes, operative notes, office notes, lab results and pathology reports.  If you have any questions about what you read please let us know at your next visit or call us at the office.  We are right here with you.

## 2019-08-06 NOTE — Telephone Encounter (Signed)
Spoke with Patient's sister. Patient is not having F, or N/V. Drainage sounds like normal body fluid that would be expected. They should continue to wear compression garment 24/7 and pay apply dressing to incision to absorb drainage. Please schedule patient to come see me Thursday 5/6 at 2:00 pm.

## 2019-08-08 ENCOUNTER — Other Ambulatory Visit: Payer: Self-pay

## 2019-08-08 ENCOUNTER — Encounter: Payer: Self-pay | Admitting: Plastic Surgery

## 2019-08-08 ENCOUNTER — Ambulatory Visit (INDEPENDENT_AMBULATORY_CARE_PROVIDER_SITE_OTHER): Payer: Medicaid Other | Admitting: Plastic Surgery

## 2019-08-08 VITALS — BP 115/75 | HR 69 | Temp 97.1°F | Ht 66.0 in | Wt 168.0 lb

## 2019-08-08 DIAGNOSIS — Z9889 Other specified postprocedural states: Secondary | ICD-10-CM

## 2019-08-08 MED ORDER — DOXYCYCLINE HYCLATE 100 MG PO TABS: 100.0000 mg | ORAL_TABLET | Freq: Two times a day (BID) | ORAL | 0 refills | Status: AC

## 2019-08-11 NOTE — Progress Notes (Signed)
Patient is a 29 year old female who underwent a fleur-de-lis's panniculectomy on 07/09/2019 with Dr. Arita Miss.  Patient has a legal guardian.  ~5 weeks PO On 5/6 visit: small opening at intersection of horizontal and vertical incisions with yellow purulent drainage, patient placed on 5 days doxy. Today small opening is still present but drainage is no longer purulent. Two places along vertical incision just above the belly button have small wounds. These are located in an area that the skin folds over upon itself. A third area higher up, also in a skin fold has an irritated appearance.  Patient denies F, N/V.   Daily application of vaseline along incisions. Cover with ABDs or other soft absorbant pad.  Sent 5 days doxy to pharmacy.  Continue to wear compression garments.  Follow up in 2-3 weeks. Call office with any questions/concerns.  The 21st Century Cures Act was signed into law in 2016 which includes the topic of electronic health records.  This provides immediate access to information in MyChart.  This includes consultation notes, operative notes, office notes, lab results and pathology reports.  If you have any questions about what you read please let us know at your next visit or call us at the office.  We are right here with you.

## 2019-08-14 ENCOUNTER — Other Ambulatory Visit: Payer: Self-pay

## 2019-08-14 ENCOUNTER — Ambulatory Visit (INDEPENDENT_AMBULATORY_CARE_PROVIDER_SITE_OTHER): Payer: Medicaid Other | Admitting: Plastic Surgery

## 2019-08-14 ENCOUNTER — Encounter: Payer: Self-pay | Admitting: Plastic Surgery

## 2019-08-14 VITALS — BP 111/79 | HR 90 | Temp 97.3°F | Ht 66.0 in | Wt 168.0 lb

## 2019-08-14 DIAGNOSIS — Z9889 Other specified postprocedural states: Secondary | ICD-10-CM

## 2019-08-14 MED ORDER — DOXYCYCLINE HYCLATE 100 MG PO TABS: 100.0000 mg | ORAL_TABLET | Freq: Two times a day (BID) | ORAL | 0 refills | Status: AC

## 2019-08-19 ENCOUNTER — Telehealth: Payer: Self-pay

## 2019-08-19 NOTE — Telephone Encounter (Signed)
Orders faxed to [facility] for wound/dressing supplies as per [place], [place] Copy scanned to chart

## 2019-08-20 ENCOUNTER — Encounter: Payer: Self-pay | Admitting: Nurse Practitioner

## 2019-08-20 ENCOUNTER — Ambulatory Visit: Payer: Medicaid Other | Admitting: Nurse Practitioner

## 2019-08-20 DIAGNOSIS — B372 Candidiasis of skin and nail: Secondary | ICD-10-CM

## 2019-08-20 DIAGNOSIS — M1710 Unilateral primary osteoarthritis, unspecified knee: Secondary | ICD-10-CM

## 2019-08-20 DIAGNOSIS — Z6826 Body mass index (BMI) 26.0-26.9, adult: Secondary | ICD-10-CM

## 2019-08-20 MED ORDER — ACETAMINOPHEN-CODEINE 300-30 MG PO TABS: 1.0000 | ORAL_TABLET | Freq: Four times a day (QID) | ORAL | 2 refills | Status: DC | PRN

## 2019-08-20 MED ORDER — NYSTATIN 100000 UNIT/GM EX POWD: Freq: Four times a day (QID) | CUTANEOUS | 2 refills | Status: DC

## 2019-08-20 MED ORDER — PHENTERMINE HCL 37.5 MG PO TABS: 37.5000 mg | ORAL_TABLET | Freq: Every day | ORAL | 1 refills | Status: DC

## 2019-08-20 NOTE — Progress Notes (Signed)
[facility] [address] [place], [place] [address]  Internal MEDICINE  Telephone Visit  Patient Name: Whitney Brandt  [id number]  [id number]  Date of Service: 08/20/2019  I connected with the patient at 12:23pm by wecam and verified the patients identity using two identifiers.   I discussed the limitations, risks, security and privacy concerns of performing an evaluation and management service by webcam and the availability of in person appointments. I also discussed with the patient that there may be a patient responsible charge related to the service.  The patient expressed understanding and agrees to proceed.    Chief Complaint  Patient presents with  . Follow-up    The patient has been contacted via telephone for follow up visit due to concerns for spread of novel coronavirus. The patient presents for follow up. She did have panniculectomy in March after having significant weight loss. [name] now at 170 pounds. Surgeon has limited her activity for now due to straining incisions and infection which developed. She took last dose of antibiotics yesterday. [name] scheduled to go back to surgeon in two weeks. He has suggested she stay on phentermine while activity levels are limited.  She also needs to have a refill for her tylenol with codeine. She takes this for arthritic issues in her knees. [name] no longer getting pain medication from surgeon. This is verified through PDMP. .       Current Medication: Outpatient Encounter Medications as of 08/20/2019  Medication Sig  . Acetaminophen-Codeine (TYLENOL/CODEINE #3) 300-30 MG tablet Take 1 tablet by mouth every 6 (six) hours as needed for pain.  . fluticasone (FLONASE) 50 MCG/ACT nasal spray Place 2 sprays into both nostrils daily. (Patient taking differently: Place 2 sprays into both nostrils daily as needed (allergies.). )  . ibuprofen (ADVIL) 800 MG tablet Take 1 tab po three times a day AS NEEDED FOR PAIN (Patient  taking differently: Take 800 mg by mouth every 8 (eight) hours as needed (pain.). )  . levocetirizine (XYZAL) 5 MG tablet Take 1 tab po day for allergic rhinitis  . mupirocin ointment (BACTROBAN) 2 % Apply to affected areas twice daily as needed  . nystatin (MYCOSTATIN/NYSTOP) powder Apply topically 4 (four) times daily.  Marland Kitchen nystatin ointment (MYCOSTATIN) Apply 1 application topically 3 (three) times daily. (Patient taking differently: Apply 1 application topically 3 (three) times daily as needed (sores under stomach). )  . phentermine (ADIPEX-P) 37.5 MG tablet Take 1 tablet (37.5 mg total) by mouth daily before breakfast.  . [DISCONTINUED] Acetaminophen-Codeine (TYLENOL/CODEINE #3) 300-30 MG tablet Take 1 tablet by mouth every 6 (six) hours as needed for pain.  . [DISCONTINUED] nystatin (MYCOSTATIN/NYSTOP) powder Apply topically 4 (four) times daily. (Patient taking differently: Apply 1 application topically 4 (four) times daily as needed (sores under stomach). )  . [DISCONTINUED] phentermine (ADIPEX-P) 37.5 MG tablet Take 1 tablet (37.5 mg total) by mouth daily before breakfast.   No facility-administered encounter medications on file as of 08/20/2019.    Surgical History: Past Surgical History:  Procedure Laterality Date  . ABDOMINAL HYSTERECTOMY    . PANNICULECTOMY N/A 07/09/2019   Procedure: PANNICULECTOMY;  Surgeon: Allena Napoleon, MD;  Location: [facility] OR;  Service: Plastics;  Laterality: N/A;  3 hours    Medical History: Past Medical History:  Diagnosis Date  . Diabetes mellitus without complication (HCC)     " she has not had diabetes in 5 years since she lost weight. " per sister, [name],  POA  . GERD (gastroesophageal reflux disease)   . Intellectual disability   . Panniculitis   . Wears glasses     Family History: Family History  Problem Relation Age of Onset  . Diabetes Mother   . Hypertension Mother   . Thyroid disease Mother     Social History   Socioeconomic History   . Marital status: Single    Spouse name: Not on file  . Number of children: Not on file  . Years of education: Not on file  . Highest education level: Not on file  Occupational History  . Not on file  Tobacco Use  . Smoking status: Never Smoker  . Smokeless tobacco: Never Used  Substance and Sexual Activity  . Alcohol use: No  . Drug use: No  . Sexual activity: Never  Other Topics Concern  . Not on file  Social History Narrative  . Not on file   Social Determinants of Health   Financial Resource Strain:   . Difficulty of Paying Living Expenses:   Food Insecurity:   . Worried About Charity fundraiser in the Last Year:   . Arboriculturist in the Last Year:   Transportation Needs:   . Film/video editor (Medical):   Marland Kitchen Lack of Transportation (Non-Medical):   Physical Activity:   . Days of Exercise per Week:   . Minutes of Exercise per Session:   Stress:   . Feeling of Stress :   Social Connections:   . Frequency of Communication with Friends and Family:   . Frequency of Social Gatherings with Friends and Family:   . Attends Religious Services:   . Active Member of Clubs or Organizations:   . Attends Archivist Meetings:   Marland Kitchen Marital Status:   Intimate Partner Violence:   . Fear of Current or Ex-Partner:   . Emotionally Abused:   Marland Kitchen Physically Abused:   . Sexually Abused:       Review of Systems  Constitutional: Positive for activity change. Negative for appetite change, chills, fatigue and unexpected weight change.       17 pound weight loss since her last visit. Has had panniculectomy. Not as active since surgery.   HENT: Negative for congestion, ear discharge, ear pain, postnasal drip, rhinorrhea, sneezing, sore throat and trouble swallowing.   Respiratory: Negative for cough, chest tightness, shortness of breath and wheezing.   Cardiovascular: Negative for chest pain and palpitations.  Gastrointestinal: Negative for abdominal pain, constipation,  diarrhea, nausea and vomiting.  Endocrine: Negative for cold intolerance, heat intolerance, polydipsia and polyuria.  Musculoskeletal: Negative for arthralgias, back pain, joint swelling and neck pain.       Intermittent knee pain, especially at night. Will generally take tylenol with codeine at night which helps to relieve the pain and enables to rest well.   Skin: Negative for rash.       Recent panniculectomy.  Allergic/Immunologic: Positive for environmental allergies.  Neurological: Negative for dizziness, tremors, weakness, numbness and headaches.  Hematological: Negative for adenopathy. Does not bruise/bleed easily.  Psychiatric/Behavioral: Negative for behavioral problems (Depression), dysphoric mood, sleep disturbance and suicidal ideas. The patient is not nervous/anxious.     Today's Vitals   08/20/19 1152  Weight: 170 lb (77.1 kg)  Height: 5\' 7"  (1.702 m)   Body mass index is 26.63 kg/m.  Observation/Objective:  Spoke with patient's legal guardian during the visit. Patient was resting.    Assessment/Plan: 1. Cutaneous candidiasis Patient continuing  to have candidal infection on buttocks. Will reassess at next visit. For now, may use nystop powder up to four times daily as needed.  - nystatin (MYCOSTATIN/NYSTOP) powder; Apply topically 4 (four) times daily.  Dispense: 60 g; Refill: 2  2. Arthrosis of knee May take tylenol/codeine at night as needed for pain. New prescription sent to her pharmacy.  - Acetaminophen-Codeine (TYLENOL/CODEINE #3) 300-30 MG tablet; Take 1 tablet by mouth every 6 (six) hours as needed for pain.  Dispense: 30 tablet; Refill: 2  3. BMI 26.0-26.9,adult Restart phentermine for now. Discussed, with guardian, medication may have to be held due to decreased BMI. May consider intermittent use of medication in future. Limit calorie intake to 1200 calories per day. For now, exercise limited due to post-operative period.  - phentermine (ADIPEX-P) 37.5 MG  tablet; Take 1 tablet (37.5 mg total) by mouth daily before breakfast.  Dispense: 30 tablet; Refill: 1  General Counseling: Sherisse verbalizes understanding of the findings of today's phone visit and agrees with plan of treatment. I have discussed any further diagnostic evaluation that may be needed or ordered today. We also reviewed her medications today. she has been encouraged to call the office with any questions or concerns that should arise related to todays visit.   There is a liability release in patients' chart. There has been a 10 minute discussion about the side effects including but not limited to elevated blood pressure, anxiety, lack of sleep and dry mouth. Pt understands and will like to start/continue on appetite suppressant at this time. There will be one month RX given at the time of visit with proper follow up. Nova diet plan with restricted calories is given to the pt. Pt understands and agrees with  plan of treatment  This patient was seen by Vincent Gros FNP Collaboration with Dr Lyndon Code as a part of collaborative care agreement  Meds ordered this encounter  Medications  . phentermine (ADIPEX-P) 37.5 MG tablet    Sig: Take 1 tablet (37.5 mg total) by mouth daily before breakfast.    Dispense:  30 tablet    Refill:  1    Order Specific Question:   Supervising Provider    Answer:   Lyndon Code [[id number]]  . nystatin (MYCOSTATIN/NYSTOP) powder    Sig: Apply topically 4 (four) times daily.    Dispense:  60 g    Refill:  2    Order Specific Question:   Supervising Provider    Answer:   Lyndon Code [[id number]]  . Acetaminophen-Codeine (TYLENOL/CODEINE #3) 300-30 MG tablet    Sig: Take 1 tablet by mouth every 6 (six) hours as needed for pain.    Dispense:  30 tablet    Refill:  2    This is not for acute pain. 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    Order Specific Question:   Supervising Provider    Answer:   Lyndon Code [[id number]]     Time spent: 28 Minutes    Dr Lyndon Code Internal medicine

## 2019-08-23 ENCOUNTER — Other Ambulatory Visit: Payer: Self-pay

## 2019-08-23 DIAGNOSIS — M1710 Unilateral primary osteoarthritis, unspecified knee: Secondary | ICD-10-CM

## 2019-08-23 MED ORDER — IBUPROFEN 800 MG PO TABS: ORAL_TABLET | ORAL | 1 refills | Status: DC

## 2019-09-03 NOTE — Progress Notes (Signed)
Patient is a 29 year old female who underwent a fleur-de-lis's panniculectomy on 07/09/2019 with Dr. Arita Miss.  Patient has a legal guardian who is present at today's visit.  ~ 8 week PO Patient reports she is doing very well.  Incisions are healing very nicely, C/D/I.  Small wounds that were superior to her bellybutton at last visit are almost completely healed.  No signs of infection, redness, drainage, seroma/hematoma.  Patient denies fever, chest pain, shortness of breath, nausea/vomiting.  Continue to wear compression garments during the day for 4 more weeks.  She may cover wound above her bellybutton as desired for comfort and to keep clothing from rubbing it.  Follow-up in 3 to 4 weeks.  Call office with any questions/concerns.  The 21st Century Cures Act was signed into law in 2016 which includes the topic of electronic health records.  This provides immediate access to information in MyChart.  This includes consultation notes, operative notes, office notes, lab results and pathology reports.  If you have any questions about what you read please let us know at your next visit or call us at the office.  We are right here with you.

## 2019-09-05 ENCOUNTER — Ambulatory Visit (INDEPENDENT_AMBULATORY_CARE_PROVIDER_SITE_OTHER): Payer: Medicaid Other | Admitting: Plastic Surgery

## 2019-09-05 ENCOUNTER — Other Ambulatory Visit: Payer: Self-pay

## 2019-09-05 ENCOUNTER — Encounter: Payer: Self-pay | Admitting: Plastic Surgery

## 2019-09-05 VITALS — BP 105/73 | HR 73 | Temp 97.1°F | Ht 67.0 in | Wt 165.4 lb

## 2019-09-05 DIAGNOSIS — Z9889 Other specified postprocedural states: Secondary | ICD-10-CM

## 2019-09-27 ENCOUNTER — Telehealth: Payer: Self-pay

## 2019-09-27 NOTE — Telephone Encounter (Signed)
Lmom to confirm and screen for 10-01-19 ov. 

## 2019-10-01 ENCOUNTER — Ambulatory Visit: Payer: Medicaid Other | Admitting: Nurse Practitioner

## 2019-10-03 ENCOUNTER — Ambulatory Visit (INDEPENDENT_AMBULATORY_CARE_PROVIDER_SITE_OTHER): Payer: Medicaid Other | Admitting: Plastic Surgery

## 2019-10-03 ENCOUNTER — Other Ambulatory Visit: Payer: Self-pay

## 2019-10-03 ENCOUNTER — Encounter: Payer: Self-pay | Admitting: Plastic Surgery

## 2019-10-03 VITALS — BP 98/71 | HR 63 | Temp 97.1°F

## 2019-10-03 DIAGNOSIS — Z9889 Other specified postprocedural states: Secondary | ICD-10-CM

## 2019-10-03 NOTE — Progress Notes (Signed)
Patient is a 29 year old female who underwent a fleur-de-lis's panniculectomy on 07/09/2019 with Dr. Arita Miss.  Patient has a legal guardian who is present at today's visit.  ~ 12 weeks PO Patient reports she is doing well overall.  Denies any concerns with her incisions.  Sister is a little concerned that she feels patient has some diffuse swelling of the upper abdomen and in the mons pubis region.  They have an appointment with her PCP next week and plan to discuss with her at that time.  Incisions have healed very nicely, C/D/I.  No signs of redness, infection, drainage.  Small wound above her bellybutton has healed nicely.  Entire abdomen is nice and soft.  No pockets of fluid felt on palpation, no fluid waves observed.    May shower normally.  May go swimming in a pool.  May resume normal activities as tolerated.  She does not have to continue wearing compression garments unless desired.  May sleep normally as tolerated.  Follow-up in 4 to 6 weeks.  Call office with any questions/concerns.  Patient is still interested in having the surgery to remove the excess tissue on her back and plans to discuss at follow-up visit.  Pictures were obtained of the patient and placed in the chart with the patient's or guardian's permission.   The 21st Century Cures Act was signed into law in 2016 which includes the topic of electronic health records.  This provides immediate access to information in MyChart.  This includes consultation notes, operative notes, office notes, lab results and pathology reports.  If you have any questions about what you read please let us know at your next visit or call us at the office.  We are right here with you.

## 2019-10-09 ENCOUNTER — Telehealth: Payer: Self-pay

## 2019-10-09 NOTE — Telephone Encounter (Signed)
Lmom to confirm and screen for 10-11-19 ov. °

## 2019-10-11 ENCOUNTER — Other Ambulatory Visit: Payer: Self-pay

## 2019-10-11 ENCOUNTER — Encounter: Payer: Self-pay | Admitting: Nurse Practitioner

## 2019-10-11 ENCOUNTER — Ambulatory Visit: Payer: Medicaid Other | Admitting: Nurse Practitioner

## 2019-10-11 VITALS — BP 119/80 | HR 71 | Temp 97.1°F | Resp 16 | Ht 67.0 in | Wt 163.6 lb

## 2019-10-11 DIAGNOSIS — Z9889 Other specified postprocedural states: Secondary | ICD-10-CM

## 2019-10-11 DIAGNOSIS — R10817 Generalized abdominal tenderness: Secondary | ICD-10-CM

## 2019-10-11 DIAGNOSIS — R601 Generalized edema: Secondary | ICD-10-CM

## 2019-10-11 DIAGNOSIS — R5383 Other fatigue: Secondary | ICD-10-CM

## 2019-10-11 MED ORDER — HYDROCHLOROTHIAZIDE 12.5 MG PO TABS: ORAL_TABLET | ORAL | 3 refills | Status: DC

## 2019-10-11 NOTE — Progress Notes (Signed)
[facility] [address] [place], [place] [address]  Internal MEDICINE  Office Visit Note  Patient Name: Whitney Brandt  [id number]  [id number]  Date of Service: 10/20/2019  Chief Complaint  Patient presents with  . Follow-up    retaining fluid  . Gastroesophageal Reflux    The patient is here for routine follow up visit. She has recently had panniculectomy after losing over 100 pounds. She had been having persistent fungal infections of the skin folds of the abdomen. It became medically necessary to remove the skin folds. The patient has done well after the procedure. Continues to lose weight. [name] limiting her calorie intake and gradually increasing her exercise routine. She states that she is feeling well, overall. She does have some generalized swelling present. This has occurred over the past few weeks. She has not chest pain, chest press, or shortness of breath. Blood pressure is well controlled.  She also needs to have a refill for her tylenol with codeine. She takes this for arthritic issues in her knees. Verified that she is no longer receiving pain medication from the surgeon. Verified on 10/20/2019 through PDMP site. She takes this only at night when she has increased arthritic pain in her knees.       Current Medication: Outpatient Encounter Medications as of 10/11/2019  Medication Sig  . Acetaminophen-Codeine (TYLENOL/CODEINE #3) 300-30 MG tablet Take 1 tablet by mouth every 6 (six) hours as needed for pain.  . fluticasone (FLONASE) 50 MCG/ACT nasal spray Place 2 sprays into both nostrils daily. (Patient taking differently: Place 2 sprays into both nostrils daily as needed (allergies.). )  . ibuprofen (ADVIL) 800 MG tablet Take 1 tab po three times a day AS NEEDED FOR PAIN  . levocetirizine (XYZAL) 5 MG tablet Take 1 tab po day for allergic rhinitis  . mupirocin ointment (BACTROBAN) 2 % Apply to affected areas twice daily as needed  . nystatin  (MYCOSTATIN/NYSTOP) powder Apply topically 4 (four) times daily.  Marland Kitchen nystatin ointment (MYCOSTATIN) Apply 1 application topically 3 (three) times daily. (Patient taking differently: Apply 1 application topically 3 (three) times daily as needed (sores under stomach). )  . phentermine (ADIPEX-P) 37.5 MG tablet Take 1 tablet (37.5 mg total) by mouth daily before breakfast.  . hydrochlorothiazide (HYDRODIURIL) 12.5 MG tablet Take 1 tablet po QAM prn swelling.   No facility-administered encounter medications on file as of 10/11/2019.    Surgical History: Past Surgical History:  Procedure Laterality Date  . ABDOMINAL HYSTERECTOMY    . PANNICULECTOMY N/A 07/09/2019   Procedure: PANNICULECTOMY;  Surgeon: Allena Napoleon, MD;  Location: [facility] OR;  Service: Plastics;  Laterality: N/A;  3 hours    Medical History: Past Medical History:  Diagnosis Date  . Diabetes mellitus without complication (HCC)     " she has not had diabetes in 5 years since she lost weight. " per sister, [name], [place]  . GERD (gastroesophageal reflux disease)   . Intellectual disability   . Panniculitis   . Wears glasses     Family History: Family History  Problem Relation Age of Onset  . Diabetes Mother   . Hypertension Mother   . Thyroid disease Mother     Social History   Socioeconomic History  . Marital status: Single    Spouse name: Not on file  . Number of children: Not on file  . Years of education: Not on file  . Highest education level: Not on file  Occupational History  .  Not on file  Tobacco Use  . Smoking status: Never Smoker  . Smokeless tobacco: Never Used  Vaping Use  . Vaping Use: Never used  Substance and Sexual Activity  . Alcohol use: No  . Drug use: No  . Sexual activity: Never  Other Topics Concern  . Not on file  Social History Narrative  . Not on file   Social Determinants of Health   Financial Resource Strain:   . Difficulty of Paying Living Expenses:   Food Insecurity:   .  Worried About Programme researcher, broadcasting/film/video in the Last Year:   . Barista in the Last Year:   Transportation Needs:   . Freight forwarder (Medical):   Marland Kitchen Lack of Transportation (Non-Medical):   Physical Activity:   . Days of Exercise per Week:   . Minutes of Exercise per Session:   Stress:   . Feeling of Stress :   Social Connections:   . Frequency of Communication with Friends and Family:   . Frequency of Social Gatherings with Friends and Family:   . Attends Religious Services:   . Active Member of Clubs or Organizations:   . Attends Banker Meetings:   Marland Kitchen Marital Status:   Intimate Partner Violence:   . Fear of Current or Ex-Partner:   . Emotionally Abused:   Marland Kitchen Physically Abused:   . Sexually Abused:       Review of Systems  Constitutional: Positive for activity change. Negative for appetite change, chills, fatigue and unexpected weight change.       Has lost two pounds since her last visit. Weight loss has been slower as she is not quite as active since her surgery. Gradually increasing her activity level.   HENT: Negative for congestion, ear discharge, ear pain, postnasal drip, rhinorrhea, sneezing, sore throat and trouble swallowing.   Respiratory: Negative for cough, chest tightness, shortness of breath and wheezing.   Cardiovascular: Negative for chest pain and palpitations.       Generalized edema.   Gastrointestinal: Positive for abdominal pain. Negative for constipation, diarrhea, nausea and vomiting.  Endocrine: Negative for cold intolerance, heat intolerance, polydipsia and polyuria.  Musculoskeletal: Negative for arthralgias, back pain, joint swelling and neck pain.       Intermittent knee pain, especially at night. Will generally take tylenol with codeine at night which helps to relieve the pain and enables to rest well.   Skin: Negative for rash.       Recent panniculectomy.  Allergic/Immunologic: Positive for environmental allergies.   Neurological: Negative for dizziness, tremors, weakness, numbness and headaches.  Hematological: Negative for adenopathy. Does not bruise/bleed easily.  Psychiatric/Behavioral: Negative for behavioral problems (Depression), dysphoric mood, sleep disturbance and suicidal ideas. The patient is not nervous/anxious.     Today's Vitals   10/11/19 1526  BP: 119/80  Pulse: 71  Resp: 16  Temp: (!) 97.1 F (36.2 C)  SpO2: 100%  Weight: 163 lb 9.6 oz (74.2 kg)  Height: 5\' 7"  (1.702 m)   Body mass index is 25.62 kg/m.  Physical Exam Vitals and nursing note reviewed.  Constitutional:      Appearance: Normal appearance. [name] well-developed.  HENT:     Head: Normocephalic and atraumatic.     Mouth/Throat:     Pharynx: Oropharynx is clear.  Eyes:     Conjunctiva/sclera: Conjunctivae normal.     Pupils: Pupils are equal, round, and reactive to light.  Neck:  Thyroid: No thyromegaly.  Cardiovascular:     Rate and Rhythm: Normal rate and regular rhythm.     Heart sounds: Normal heart sounds.     Comments: Mild, generalized edema noted. Edema is non pitting.  Pulmonary:     Effort: Pulmonary effort is normal.     Breath sounds: Normal breath sounds. No wheezing.  Abdominal:     General: Bowel sounds are normal.     Palpations: Abdomen is soft.     Tenderness: There is abdominal tenderness.     Comments: Abdominal tenderness, especially in the left upper and lower abdominal aspects of the abdomen at incision sites.   Musculoskeletal:     Cervical back: Normal range of motion and neck supple.     Right knee: Swelling present. Decreased range of motion. Tenderness present.     Left knee: Swelling present. Decreased range of motion. Tenderness present.     Comments: Bilateral knee pain, more severe after exertion. There is bruising and swelling over the right patella. ROM and strength of the right knee is currently intact.   Skin:    General: Skin is warm and dry.     Capillary  Refill: Capillary refill takes less than 2 seconds.     Findings: No erythema or rash.  Neurological:     Mental Status: [name] alert and oriented to person, place, and time. Mental status is at baseline.     Comments: Patient is at her neurological baseline.   Psychiatric:        Mood and Affect: Mood normal.        Behavior: Behavior normal.        Thought Content: Thought content normal.        Judgment: Judgment normal.    Assessment/Plan: 1. Generalized edema Start HCTZ 12.5mg  tablets daily. wil lget echocardiogram for further evaluation.  - hydrochlorothiazide (HYDRODIURIL) 12.5 MG tablet; Take 1 tablet po QAM prn swelling.  Dispense: 30 tablet; Refill: 3 - ECHOCARDIOGRAM COMPLETE; Future  2. Status post panniculectomy Having some abdominal tenderness, especially around incisions of abdomen. Will get ultrasound of the abdomen for further evaluation.   3. Other fatigue Check labs.   4. Generalized abdominal tenderness without rebound tenderness Having some abdominal tenderness, especially around incisions of abdomen. Will get ultrasound of the abdomen for further evaluation.  - US Abdomen Complete; Future  General Counseling: Yaritzy verbalizes understanding of the findings of todays visit and agrees with plan of treatment. I have discussed any further diagnostic evaluation that may be needed or ordered today. We also reviewed her medications today. she has been encouraged to call the office with any questions or concerns that should arise related to todays visit.  This patient was seen by Vincent Gros FNP Collaboration with Dr Lyndon Code as a part of collaborative care agreement  Orders Placed This Encounter  Procedures  . US Abdomen Complete  . ECHOCARDIOGRAM COMPLETE    Meds ordered this encounter  Medications  . hydrochlorothiazide (HYDRODIURIL) 12.5 MG tablet    Sig: Take 1 tablet po QAM prn swelling.    Dispense:  30 tablet    Refill:  3    Order Specific  Question:   Supervising Provider    Answer:   Lyndon Code [[id number]]    Total time spent: 30 Minutes   Time spent includes review of chart, medications, test results, and follow up plan with the patient.      Dr Lyndon Code Internal  medicine

## 2019-10-14 ENCOUNTER — Telehealth: Payer: Self-pay

## 2019-10-14 NOTE — Telephone Encounter (Signed)
Left a message and asked patient to call back and schedule echo and abdomen US from last visit note. Beth

## 2019-10-17 ENCOUNTER — Telehealth: Payer: Self-pay

## 2019-10-17 NOTE — Telephone Encounter (Signed)
Left VM at pharmacy on PA for tylenol w/codeine.  Advised pharmacy to enter PA code [id number] and to resubmit the claim.  Advised to call back if any questions  dbs

## 2019-10-20 DIAGNOSIS — R601 Generalized edema: Secondary | ICD-10-CM | POA: Insufficient documentation

## 2019-10-20 DIAGNOSIS — R5383 Other fatigue: Secondary | ICD-10-CM | POA: Insufficient documentation

## 2019-10-24 ENCOUNTER — Encounter: Payer: Self-pay | Admitting: Surgical

## 2019-10-24 ENCOUNTER — Other Ambulatory Visit: Payer: Self-pay

## 2019-10-24 ENCOUNTER — Ambulatory Visit (INDEPENDENT_AMBULATORY_CARE_PROVIDER_SITE_OTHER): Payer: Medicaid Other | Admitting: Surgical

## 2019-10-24 VITALS — BP 118/88 | HR 59 | Temp 98.1°F

## 2019-10-24 DIAGNOSIS — Z9889 Other specified postprocedural states: Secondary | ICD-10-CM | POA: Diagnosis not present

## 2019-10-24 NOTE — Progress Notes (Signed)
Subjective:     Patient ID: Whitney Brandt, female    DOB: March 28, 1991, 29 y.o.   MRN: [id number]  Chief Complaint  Patient presents with  . Follow-up    HPI: The patient is a 29 y.o. female here for follow-up after fleur-de-lis panniculectomy on 07/09/2019 with Dr. Arita Miss.  Patient is here with her mother.  Patient was last seen on 10/03/2019 in this office and was doing well.  Per EMR review patient was seen by her PCP/internal medicine provider on 10/11/2019 and ultrasound of abdomen was ordered for evaluation of fluid.  This ultrasound has not been completed yet.  Patient is here today with a chief complaint of a wound developing along the vertical incision within an upper abdominal fold.  Patient also reports that she is currently being evaluated by her PCP for swelling of her abdomen, lower extremities.  They currently have an echocardiogram and ultrasound of abdomen ordered.  Patient reports she feels well.  No fevers, chills, nausea, vomiting.  Review of Systems  Constitutional: Negative.   Respiratory: Negative.   Cardiovascular: Positive for leg swelling.  Gastrointestinal: Negative for abdominal distention.  Skin: Positive for wound.     Objective:   Vital Signs BP (!) 118/88 (BP Location: Left Arm, Patient Position: Sitting, Cuff Size: Large)   Pulse 59   Temp 98.1 F (36.7 C) (Oral)   SpO2 100%  Vital Signs and Nursing Note Reviewed Chaperone present Physical Exam Constitutional:      Appearance: Normal appearance.  Cardiovascular:     Rate and Rhythm: Normal rate.  Pulmonary:     Effort: Pulmonary effort is normal.  Abdominal:     General: There is no distension.     Palpations: Abdomen is soft.     Tenderness: There is no abdominal tenderness. There is no guarding or rebound.    Musculoskeletal:     Right lower leg: Edema present.     Left lower leg: Edema present.  Skin:    General: Skin is warm and dry.  Neurological:     General: No focal deficit  present.     Mental Status: [name] alert and oriented to person, place, and time. Mental status is at baseline.  Psychiatric:        Mood and Affect: Mood normal.        Behavior: Behavior normal.       Assessment/Plan:     ICD-10-CM   1. Status post panniculectomy  Z98.890     Dr. Arita Miss was present during today's evaluation.  Overall it appears that the patient's abdominal swelling is of her upper abdomen.  Discussed with patient that this area was not undermined during her operation and it is unlikely that there is a fluid collection, however it is possible and an abdominal ultrasound would be helpful.  Due to the lower extremity swelling in conjunction with the upper abdomen swelling, it is also possible that the swelling is within the tissue and not a single fluid or multiple fluid collections.  There is no sign of any infection, no overt sign of any hematoma or seroma.  I did not feel any fluid collections on exam.  I did try to aspirate the left abdominal area just lateral to the vertical incision using a sterile technique.  This was unsuccessful and no fluid was aspirated.  Patient tolerated this without any issues.  In regards to the abdominal wound, recommend applying Vaseline daily and covering with gauze.   Recommend  calling with questions or concerns. Follow up scheduled for 3 weeks for reevaluation of incisional wound.  Pictures were obtained of the patient and placed in the chart with the patient's or guardian's permission.    Kermit Balo Maryelizabeth Eberle, PA-C 10/24/2019, 3:36 PM

## 2019-10-25 ENCOUNTER — Other Ambulatory Visit: Payer: Self-pay | Admitting: Nurse Practitioner

## 2019-10-25 ENCOUNTER — Ambulatory Visit: Payer: Self-pay

## 2019-10-25 DIAGNOSIS — R601 Generalized edema: Secondary | ICD-10-CM

## 2019-10-25 DIAGNOSIS — R609 Edema, unspecified: Secondary | ICD-10-CM

## 2019-10-26 LAB — COMPREHENSIVE METABOLIC PANEL
ALT: 13 IU/L (ref 0–32)
AST: 8 IU/L (ref 0–40)
Albumin/Globulin Ratio: 2.2 (ref 1.2–2.2)
Albumin: 4.4 g/dL (ref 3.9–5.0)
Alkaline Phosphatase: 85 IU/L (ref 48–121)
BUN/Creatinine Ratio: 13 (ref 9–23)
BUN: 9 mg/dL (ref 6–20)
Bilirubin Total: 0.4 mg/dL (ref 0.0–1.2)
CO2: 29 mmol/L (ref 20–29)
Calcium: 9.7 mg/dL (ref 8.7–10.2)
Chloride: 102 mmol/L (ref 96–106)
Creatinine, Ser: 0.68 mg/dL (ref 0.57–1.00)
GFR calc Af Amer: 136 mL/min/{1.73_m2} (ref >59)
GFR calc non Af Amer: 118 mL/min/{1.73_m2} (ref >59)
Globulin, Total: 2 g/dL (ref 1.5–4.5)
Glucose: 99 mg/dL (ref 65–99)
Potassium: 3.6 mmol/L (ref 3.5–5.2)
Sodium: 142 mmol/L (ref 134–144)
Total Protein: 6.4 g/dL (ref 6.0–8.5)

## 2019-10-26 LAB — CBC
Hematocrit: 43.9 % (ref 34.0–46.6)
Hemoglobin: 14.2 g/dL (ref 11.1–15.9)
MCH: 27.8 pg (ref 26.6–33.0)
MCHC: 32.3 g/dL (ref 31.5–35.7)
MCV: 86 fL (ref 79–97)
Platelets: 219 10*3/uL (ref 150–450)
RBC: 5.11 x10E6/uL (ref 3.77–5.28)
RDW: 12.7 % (ref 11.7–15.4)
WBC: 6 10*3/uL (ref 3.4–10.8)

## 2019-10-26 LAB — T4, FREE: Free T4: 1.18 ng/dL (ref 0.82–1.77)

## 2019-10-26 LAB — TSH: TSH: 1.67 u[IU]/mL (ref 0.450–4.500)

## 2019-10-28 ENCOUNTER — Other Ambulatory Visit: Payer: Self-pay

## 2019-10-28 DIAGNOSIS — J309 Allergic rhinitis, unspecified: Secondary | ICD-10-CM

## 2019-10-28 MED ORDER — FLUTICASONE PROPIONATE 50 MCG/ACT NA SUSP: 2.0000 | Freq: Every day | NASAL | 3 refills | Status: DC | PRN

## 2019-10-30 ENCOUNTER — Other Ambulatory Visit: Payer: Self-pay | Admitting: Nurse Practitioner

## 2019-10-30 ENCOUNTER — Telehealth: Payer: Self-pay

## 2019-10-30 DIAGNOSIS — R601 Generalized edema: Secondary | ICD-10-CM

## 2019-10-30 MED ORDER — HYDROCHLOROTHIAZIDE 25 MG PO TABS: ORAL_TABLET | ORAL | 2 refills | Status: DC

## 2019-10-30 NOTE — Telephone Encounter (Signed)
Increased dose HCTZ to 25mg  tablets. Her echo was really good. No issue with heart. With increased fluid pill, she needs to be monitoring her blood pressure daily as this can lower the BP. She also needs to increase her water intake. Thanks.

## 2019-10-30 NOTE — Telephone Encounter (Signed)
Spoke to pt's mother and informed her of the change in medication and other instructions.  dbs

## 2019-10-30 NOTE — Progress Notes (Signed)
Can you also let her know that all labs were normal. Thanks.

## 2019-10-30 NOTE — Progress Notes (Signed)
Patient to be notified that echo looked good.

## 2019-10-30 NOTE — Progress Notes (Signed)
Increased dose HCTZ to 25mg  tablets

## 2019-11-05 ENCOUNTER — Ambulatory Visit: Payer: Medicaid Other | Admitting: Nurse Practitioner

## 2019-11-14 ENCOUNTER — Ambulatory Visit (INDEPENDENT_AMBULATORY_CARE_PROVIDER_SITE_OTHER): Payer: Medicaid Other | Admitting: Plastic Surgery

## 2019-11-14 ENCOUNTER — Other Ambulatory Visit: Payer: Self-pay

## 2019-11-14 ENCOUNTER — Encounter: Payer: Self-pay | Admitting: Plastic Surgery

## 2019-11-14 VITALS — BP 101/71 | HR 55 | Temp 97.8°F

## 2019-11-14 DIAGNOSIS — Z9889 Other specified postprocedural states: Secondary | ICD-10-CM

## 2019-11-14 NOTE — Progress Notes (Signed)
   Referring Provider Carlean Jews, NP [address] [place],  [place] [address]   CC:  Chief Complaint  Patient presents with  . Follow-up      Whitney Brandt is an 29 y.o. female.  HPI: Patient presents about 6 weeks out from her last visit.  She continues to have an open wound in her midline incision just above her umbilicus.  Her sisters been taking care of it and says that she believes it is gotten a little bit bigger in size.  [name] doing Vaseline and gauze dressings.  Review of Systems General: Denies fevers and chills  Physical Exam Vitals with BMI 11/14/2019 10/24/2019 10/11/2019  Height - - 5\' 7"   Weight - - 163 lbs 10 oz  BMI - - 25.62  Systolic 101 118  Diastolic 71 88 80  Pulse 55 59 71    General:  No acute distress,  Alert and oriented, Non-Toxic, Normal speech and affect Eye exam there is a 2 to 3 cm wide superficial wound ulceration along the vertical midline incision just above the umbilicus.  There is no surrounding erythema.  There is no purulence or fluctuance.  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  This looks to be at the point where the upper pannus still folds over the lower abdomen.  I suspect this is the reason for the wound breakdown in that location.  Otherwise everything is healed fine.  We will plan to continue conservative measures for another month.  I do not have a great surgical solution for this at this point time.  We will plan to reevaluate after month goes by and decide if any other measures are necessary.  Patient is getting medical treatment for total body swelling and has been put on diuretics to get her fluid down.  Hopefully this will help her heal this.  All the questions were answered.  025 11/14/2019, 3:58 PM

## 2019-11-20 ENCOUNTER — Telehealth: Payer: Self-pay

## 2019-11-20 NOTE — Telephone Encounter (Signed)
Confirmed US on 8/20 

## 2019-11-22 ENCOUNTER — Other Ambulatory Visit: Payer: Medicaid Other

## 2019-11-29 ENCOUNTER — Ambulatory Visit: Payer: Medicaid Other | Admitting: Nurse Practitioner

## 2019-12-19 ENCOUNTER — Other Ambulatory Visit: Payer: Self-pay

## 2019-12-19 ENCOUNTER — Ambulatory Visit (INDEPENDENT_AMBULATORY_CARE_PROVIDER_SITE_OTHER): Payer: Medicaid Other | Admitting: Plastic Surgery

## 2019-12-19 ENCOUNTER — Encounter: Payer: Self-pay | Admitting: Plastic Surgery

## 2019-12-19 VITALS — HR 55 | Temp 97.4°F

## 2019-12-19 DIAGNOSIS — Z9889 Other specified postprocedural states: Secondary | ICD-10-CM

## 2019-12-19 NOTE — Progress Notes (Signed)
Patient presents approximately 6 months postop from a fleur-de-lis panniculectomy.  She had totally healed and then developed a wound along the vertical midline.  At the same time she had been experiencing a lot of swelling and fluid retention.  Since her last visit she has increased her diuretic and her wound is totally healed.  On examination all of her incisions are totally healed and her overall contour appears better I suspect because of the increase in her diuretic and the fluid off.  [name] overall very happy and will plan to follow-up with her again on an as-needed basis.  All of her questions were answered.

## 2020-01-03 ENCOUNTER — Other Ambulatory Visit: Payer: Medicaid Other

## 2020-01-09 ENCOUNTER — Ambulatory Visit: Payer: Medicaid Other | Admitting: Nurse Practitioner

## 2020-01-10 ENCOUNTER — Other Ambulatory Visit: Payer: Self-pay

## 2020-01-10 MED ORDER — LEVOCETIRIZINE DIHYDROCHLORIDE 5 MG PO TABS: ORAL_TABLET | ORAL | 3 refills | Status: DC

## 2020-01-14 ENCOUNTER — Encounter: Payer: Self-pay | Admitting: Nurse Practitioner

## 2020-01-14 ENCOUNTER — Ambulatory Visit: Payer: Medicaid Other | Admitting: Nurse Practitioner

## 2020-01-14 VITALS — Temp 102.3°F | Ht 67.5 in | Wt 160.0 lb

## 2020-01-14 DIAGNOSIS — R601 Generalized edema: Secondary | ICD-10-CM | POA: Diagnosis not present

## 2020-01-14 DIAGNOSIS — U071 COVID-19: Secondary | ICD-10-CM | POA: Insufficient documentation

## 2020-01-14 DIAGNOSIS — J069 Acute upper respiratory infection, unspecified: Secondary | ICD-10-CM

## 2020-01-14 MED ORDER — AZITHROMYCIN 250 MG PO TABS: ORAL_TABLET | ORAL | 0 refills | Status: DC

## 2020-01-14 MED ORDER — HYDROCHLOROTHIAZIDE 25 MG PO TABS: ORAL_TABLET | ORAL | 1 refills | Status: DC

## 2020-01-14 NOTE — Progress Notes (Signed)
[facility] [address] [place], [place] [address]  Internal MEDICINE  Telephone Visit  Patient Name: Whitney Brandt  [id number]  [id number]  Date of Service: 01/14/2020  I connected with the patient at 12:24pm by telephone and verified the patients identity using two identifiers.   I discussed the limitations, risks, security and privacy concerns of performing an evaluation and management service by telephone and the availability of in person appointments. I also discussed with the patient that there may be a patient responsible charge related to the service.  The patient expressed understanding and agrees to proceed.    Chief Complaint  Patient presents with  . Telephone Assessment    [id number]  . Telephone Screen    covid positive   . Cough    going on for few days   . Headache  . Fever    The patient has been contacted via telephone for follow up visit due to concerns for spread of novel coronavirus. The patient presents for sick visit. Was recently diagnosed with COVID 19 lat week. She has fever and cough. She has taken tylenol to help the fever. States that it does not work. She denies trouble breathing or wheezing. She has mild headache. She states that she feels very tired. She states that she has also talked to her ENT provider and is currently on antibiotic ear drops. She states that she is unable to taste anything. She states that she was not vaccinated       Current Medication: Outpatient Encounter Medications as of 01/14/2020  Medication Sig  . Acetaminophen-Codeine (TYLENOL/CODEINE #3) 300-30 MG tablet Take 1 tablet by mouth every 6 (six) hours as needed for pain.  . fluticasone (FLONASE) 50 MCG/ACT nasal spray Place 2 sprays into both nostrils daily as needed (allergies.).  Marland Kitchen hydrochlorothiazide (HYDRODIURIL) 25 MG tablet Take 1 tablet po QAM prn swelling.  Marland Kitchen ibuprofen (ADVIL) 800 MG tablet Take 1 tab po three times a day AS NEEDED FOR PAIN  .  levocetirizine (XYZAL) 5 MG tablet Take 1 tab po day for allergic rhinitis  . mupirocin ointment (BACTROBAN) 2 % Apply to affected areas twice daily as needed  . nystatin (MYCOSTATIN/NYSTOP) powder Apply topically 4 (four) times daily.  Marland Kitchen nystatin ointment (MYCOSTATIN) Apply 1 application topically 3 (three) times daily. (Patient taking differently: Apply 1 application topically 3 (three) times daily as needed (sores under stomach). )  . [DISCONTINUED] hydrochlorothiazide (HYDRODIURIL) 25 MG tablet Take 1 tablet po QAM prn swelling.  Marland Kitchen azithromycin (ZITHROMAX) 250 MG tablet z-pack - take as directed for 5 days   No facility-administered encounter medications on file as of 01/14/2020.    Surgical History: Past Surgical History:  Procedure Laterality Date  . ABDOMINAL HYSTERECTOMY    . PANNICULECTOMY N/A 07/09/2019   Procedure: PANNICULECTOMY;  Surgeon: Allena Napoleon, MD;  Location: [facility] OR;  Service: Plastics;  Laterality: N/A;  3 hours    Medical History: Past Medical History:  Diagnosis Date  . Diabetes mellitus without complication (HCC)     " she has not had diabetes in 5 years since she lost weight. " per sister, [name], [place]  . GERD (gastroesophageal reflux disease)   . Intellectual disability   . Panniculitis   . Wears glasses     Family History: Family History  Problem Relation Age of Onset  . Diabetes Mother   . Hypertension Mother   . Thyroid disease Mother     Social History   Socioeconomic  History  . Marital status: Single    Spouse name: Not on file  . Number of children: Not on file  . Years of education: Not on file  . Highest education level: Not on file  Occupational History  . Not on file  Tobacco Use  . Smoking status: Never Smoker  . Smokeless tobacco: Never Used  Vaping Use  . Vaping Use: Never used  Substance and Sexual Activity  . Alcohol use: No  . Drug use: No  . Sexual activity: Never  Other Topics Concern  . Not on file  Social History  Narrative  . Not on file   Social Determinants of Health   Financial Resource Strain:   . Difficulty of Paying Living Expenses: Not on file  Food Insecurity:   . Worried About Programme researcher, broadcasting/film/videounning Out of Food in the Last Year: Not on file  . Ran Out of Food in the Last Year: Not on file  Transportation Needs:   . Lack of Transportation (Medical): Not on file  . Lack of Transportation (Non-Medical): Not on file  Physical Activity:   . Days of Exercise per Week: Not on file  . Minutes of Exercise per Session: Not on file  Stress:   . Feeling of Stress : Not on file  Social Connections:   . Frequency of Communication with Friends and Family: Not on file  . Frequency of Social Gatherings with Friends and Family: Not on file  . Attends Religious Services: Not on file  . Active Member of Clubs or Organizations: Not on file  . Attends BankerClub or Organization Meetings: Not on file  . Marital Status: Not on file  Intimate Partner Violence:   . Fear of Current or Ex-Partner: Not on file  . Emotionally Abused: Not on file  . Physically Abused: Not on file  . Sexually Abused: Not on file      Review of Systems  Constitutional: Positive for activity change, fatigue and fever. Negative for chills and unexpected weight change.       Decreased appetite.   HENT: Positive for congestion, ear pain, postnasal drip, rhinorrhea, sinus pressure, sinus pain and sore throat. Negative for sneezing.   Respiratory: Positive for cough. Negative for chest tightness and shortness of breath.   Cardiovascular: Negative for chest pain and palpitations.  Gastrointestinal: Negative for abdominal pain, constipation, diarrhea, nausea and vomiting.  Musculoskeletal: Negative for arthralgias, back pain, joint swelling and neck pain.  Skin: Negative for rash.  Allergic/Immunologic: Positive for environmental allergies.  Neurological: Positive for headaches. Negative for tremors and numbness.  Hematological: Negative for  adenopathy. Does not bruise/bleed easily.  Psychiatric/Behavioral: Negative for behavioral problems (Depression), sleep disturbance and suicidal ideas. The patient is not nervous/anxious.     Today's Vitals   01/14/20 1148  Temp: (!) 102.3 F (39.1 C)  Weight: 160 lb (72.6 kg)  Height: 5' 7.5" (1.715 m)   Body mass index is 24.69 kg/m.  Observation/Objective:   The patient is alert and oriented. [name] pleasant and answers all questions appropriately. Breathing is non-labored. [name] in no acute distress at this time.  The patient sounds congested .  Assessment/Plan: 1. Upper respiratory tract infection due to COVID-19 virus Patient presumed positive for COVID 19 due to other family members positive. Start z-pack. Take as directed for 5 days. Rest and increase fluids, recommend she take OTC medications as needed and as indicated to alleviate symptoms.  - azithromycin (ZITHROMAX) 250 MG tablet; z-pack - take  as directed for 5 days  Dispense: 6 tablet; Refill: 0  2. Generalized edema Renew HCTZ. Advised she hold this while not feeling well. May restart after appetite returns to normal.  - hydrochlorothiazide (HYDRODIURIL) 25 MG tablet; Take 1 tablet po QAM prn swelling.  Dispense: 90 tablet; Refill: 1  General Counseling: Jenell verbalizes understanding of the findings of today's phone visit and agrees with plan of treatment. I have discussed any further diagnostic evaluation that may be needed or ordered today. We also reviewed her medications today. she has been encouraged to call the office with any questions or concerns that should arise related to todays visit.      Person Under Monitoring Name: HAZELL SIWIK  Location: [address] [place] [address]   Infection Prevention Recommendations for Individuals Confirmed to have, or Being Evaluated for, 2019 Novel Coronavirus (COVID-19) Infection Who Receive Care at Home  Individuals who are confirmed to have, or  are being evaluated for, COVID-19 should follow the prevention steps below until a healthcare provider or local or state health department says they can return to normal activities.  Stay home except to get medical care You should restrict activities outside your home, except for getting medical care. Do not go to work, school, or public areas, and do not use public transportation or taxis.  Call ahead before visiting your doctor Before your medical appointment, call the healthcare provider and tell them that you have, or are being evaluated for, COVID-19 infection. This will help the healthcare provider's office take steps to keep other people from getting infected. Ask your healthcare provider to call the local or state health department.  Monitor your symptoms Seek prompt medical attention if your illness is worsening (e.g., difficulty breathing). Before going to your medical appointment, call the healthcare provider and tell them that you have, or are being evaluated for, COVID-19 infection. Ask your healthcare provider to call the local or state health department.  Wear a facemask You should wear a facemask that covers your nose and mouth when you are in the same room with other people and when you visit a healthcare provider. People who live with or visit you should also wear a facemask while they are in the same room with you.  Separate yourself from other people in your home As much as possible, you should stay in a different room from other people in your home. Also, you should use a separate bathroom, if available.  Avoid sharing household items You should not share dishes, drinking glasses, cups, eating utensils, towels, bedding, or other items with other people in your home. After using these items, you should wash them thoroughly with soap and water.  Cover your coughs and sneezes Cover your mouth and nose with a tissue when you cough or sneeze, or you can cough or sneeze  into your sleeve. Throw used tissues in a lined trash can, and immediately wash your hands with soap and water for at least 20 seconds or use an alcohol-based hand rub.  Wash your [employer] your hands often and thoroughly with soap and water for at least 20 seconds. You can use an alcohol-based hand sanitizer if soap and water are not available and if your hands are not visibly dirty. Avoid touching your eyes, nose, and mouth with unwashed hands.   Prevention Steps for Caregivers and Household Members of Individuals Confirmed to have, or Being Evaluated for, COVID-19 Infection Being Cared for in the Home  If you  live with, or provide care at home for, a person confirmed to have, or being evaluated for, COVID-19 infection please follow these guidelines to prevent infection:  Follow healthcare provider's instructions Make sure that you understand and can help the patient follow any healthcare provider instructions for all care.  Provide for the patient's basic needs You should help the patient with basic needs in the home and provide support for getting groceries, prescriptions, and other personal needs.  Monitor the patient's symptoms If they are getting sicker, call his or her medical provider and tell them that the patient has, or is being evaluated for, COVID-19 infection. This will help the healthcare provider's office take steps to keep other people from getting infected. Ask the healthcare provider to call the local or state health department.  Limit the number of people who have contact with the patient  If possible, have only one caregiver for the patient.  Other household members should stay in another home or place of residence. If this is not possible, they should stay  in another room, or be separated from the patient as much as possible. Use a separate bathroom, if available.  Restrict visitors who do not have an essential need to be in the home.  Keep older adults,  very young children, and other sick people away from the patient Keep older adults, very young children, and those who have compromised immune systems or chronic health conditions away from the patient. This includes people with chronic heart, lung, or kidney conditions, diabetes, and cancer.  Ensure good ventilation Make sure that shared spaces in the home have good air flow, such as from an air conditioner or an opened window, weather permitting.  Wash your hands often  Wash your hands often and thoroughly with soap and water for at least 20 seconds. You can use an alcohol based hand sanitizer if soap and water are not available and if your hands are not visibly dirty.  Avoid touching your eyes, nose, and mouth with unwashed hands.  Use disposable paper towels to dry your hands. If not available, use dedicated cloth towels and replace them when they become wet.  Wear a facemask and gloves  Wear a disposable facemask at all times in the room and gloves when you touch or have contact with the patient's blood, body fluids, and/or secretions or excretions, such as sweat, saliva, sputum, nasal mucus, vomit, urine, or feces.  Ensure the mask fits over your nose and mouth tightly, and do not touch it during use.  Throw out disposable facemasks and gloves after using them. Do not reuse.  Wash your hands immediately after removing your facemask and gloves.  If your personal clothing becomes contaminated, carefully remove clothing and launder. Wash your hands after handling contaminated clothing.  Place all used disposable facemasks, gloves, and other waste in a lined container before disposing them with other household waste.  Remove gloves and wash your hands immediately after handling these items.  Do not share dishes, glasses, or other household items with the patient  Avoid sharing household items. You should not share dishes, drinking glasses, cups, eating utensils, towels, bedding, or  other items with a patient who is confirmed to have, or being evaluated for, COVID-19 infection.  After the person uses these items, you should wash them thoroughly with soap and water.  Wash laundry thoroughly  Immediately remove and wash clothes or bedding that have blood, body fluids, and/or secretions or excretions, such as sweat, saliva, sputum,  nasal mucus, vomit, urine, or feces, on them.  Wear gloves when handling laundry from the patient.  Read and follow directions on labels of laundry or clothing items and detergent. In general, wash and dry with the warmest temperatures recommended on the label.  Clean all areas the individual has used often  Clean all touchable surfaces, such as counters, tabletops, doorknobs, bathroom fixtures, toilets, phones, keyboards, tablets, and bedside tables, every day. Also, clean any surfaces that may have blood, body fluids, and/or secretions or excretions on them.  Wear gloves when cleaning surfaces the patient has come in contact with.  Use a diluted bleach solution (e.g., dilute bleach with 1 part bleach and 10 parts water) or a household disinfectant with a label that says EPA-registered for coronaviruses. To make a bleach solution at home, add 1 tablespoon of bleach to 1 quart (4 cups) of water. For a larger supply, add  cup of bleach to 1 gallon (16 cups) of water.  Read labels of cleaning products and follow recommendations provided on product labels. Labels contain instructions for safe and effective use of the cleaning product including precautions you should take when applying the product, such as wearing gloves or eye protection and making sure you have good ventilation during use of the product.  Remove gloves and wash hands immediately after cleaning.  Monitor yourself for signs and symptoms of illness Caregivers and household members are considered close contacts, should monitor their health, and will be asked to limit movement  outside of the home to the extent possible. Follow the monitoring steps for close contacts listed on the symptom monitoring form.   ? If you have additional questions, contact your local health department or call the epidemiologist on call at [phone] (available 24/7). ? This guidance is subject to change. For the most up-to-date guidance from [facility], please refer to their website: [email or web address]hu  This patient was seen by Vincent Gros FNP Collaboration with Dr Lyndon Code as a part of collaborative care agreement  Meds ordered this encounter  Medications  . azithromycin (ZITHROMAX) 250 MG tablet    Sig: z-pack - take as directed for 5 days    Dispense:  6 tablet    Refill:  0    Order Specific Question:   Supervising Provider    Answer:   Lyndon Code [[id number]]  . hydrochlorothiazide (HYDRODIURIL) 25 MG tablet    Sig: Take 1 tablet po QAM prn swelling.    Dispense:  90 tablet    Refill:  1    Please note increased dosing.    Order Specific Question:   Supervising Provider    Answer:   Lyndon Code [[id number]]    Time spent: 46 Minutes    Dr Lyndon Code Internal medicine

## 2020-01-15 ENCOUNTER — Other Ambulatory Visit: Payer: Medicaid Other

## 2020-01-16 ENCOUNTER — Other Ambulatory Visit: Payer: Self-pay

## 2020-01-16 DIAGNOSIS — J309 Allergic rhinitis, unspecified: Secondary | ICD-10-CM

## 2020-01-16 MED ORDER — FLUTICASONE PROPIONATE 50 MCG/ACT NA SUSP: 2.0000 | Freq: Every day | NASAL | 3 refills | Status: DC | PRN

## 2020-04-13 ENCOUNTER — Other Ambulatory Visit: Payer: Self-pay

## 2020-04-13 DIAGNOSIS — M1710 Unilateral primary osteoarthritis, unspecified knee: Secondary | ICD-10-CM

## 2020-04-13 MED ORDER — IBUPROFEN 800 MG PO TABS: ORAL_TABLET | ORAL | 1 refills | Status: DC

## 2020-04-16 ENCOUNTER — Other Ambulatory Visit: Payer: Medicaid Other | Admitting: Nurse Practitioner

## 2020-06-11 ENCOUNTER — Other Ambulatory Visit: Payer: Self-pay | Admitting: Nurse Practitioner

## 2020-06-11 DIAGNOSIS — M1710 Unilateral primary osteoarthritis, unspecified knee: Secondary | ICD-10-CM

## 2020-06-25 ENCOUNTER — Encounter: Payer: Self-pay | Admitting: Hospice and Palliative Medicine

## 2020-06-25 ENCOUNTER — Other Ambulatory Visit: Payer: Self-pay

## 2020-06-25 ENCOUNTER — Ambulatory Visit: Payer: Medicaid Other | Admitting: Hospice and Palliative Medicine

## 2020-06-25 ENCOUNTER — Ambulatory Visit
Admission: RE | Admit: 2020-06-25 | Discharge: 2020-06-25 | Disposition: A | Discharge status: Home / Self Care | Payer: Medicaid Other | Source: Ambulatory Visit | Attending: Hospice and Palliative Medicine | Admitting: Hospice and Palliative Medicine

## 2020-06-25 VITALS — BP 118/68 | HR 61 | Temp 98.5°F | Wt 168.8 lb

## 2020-06-25 DIAGNOSIS — R5383 Other fatigue: Secondary | ICD-10-CM | POA: Diagnosis not present

## 2020-06-25 DIAGNOSIS — M25562 Pain in left knee: Secondary | ICD-10-CM | POA: Insufficient documentation

## 2020-06-25 DIAGNOSIS — M1710 Unilateral primary osteoarthritis, unspecified knee: Secondary | ICD-10-CM

## 2020-06-25 MED ORDER — ACETAMINOPHEN-CODEINE 300-30 MG PO TABS: 1.0000 | ORAL_TABLET | Freq: Four times a day (QID) | ORAL | 0 refills | Status: DC | PRN

## 2020-06-25 NOTE — Progress Notes (Signed)
[facility] [address] [place], [place] [address]  Internal MEDICINE  Office Visit Note  Patient Name: Whitney Brandt  [id number]  [id number]  Date of Service: 06/26/2020  Chief Complaint  Patient presents with  . Knee Pain    Was on trampoline and her nephew flipped and hit her leg in mid jump and she went down     HPI [name] for a sick visit. 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 fluticasone (FLONASE) 50 MCG/ACT nasal spray Place 2 sprays into both nostrils daily as needed (allergies.).  Marland Kitchen hydrochlorothiazide (HYDRODIURIL) 25 MG tablet Take 1 tablet po QAM prn swelling.  Marland Kitchen ibuprofen (ADVIL) 800 MG tablet Take 1 tab po three times a day AS NEEDED FOR PAIN  . levocetirizine (XYZAL) 5 MG tablet Take 1 tab po day for allergic rhinitis  . mupirocin ointment (BACTROBAN) 2 % Apply to affected areas twice daily as needed  . nystatin (MYCOSTATIN/NYSTOP) powder Apply topically 4 (four) times daily.  Marland Kitchen nystatin ointment (MYCOSTATIN) Apply 1 application topically 3 (three) times daily. (Patient taking differently: Apply 1 application topically 3 (three) times daily as needed (sores under stomach).)  . [DISCONTINUED] Acetaminophen-Codeine (TYLENOL/CODEINE #3) 300-30 MG tablet Take 1 tablet by mouth every 6 (six) hours as needed for pain.  . Acetaminophen-Codeine (TYLENOL/CODEINE #3) 300-30 MG tablet Take 1 tablet by mouth every 6  (six) hours as needed for pain.  . [DISCONTINUED] azithromycin (ZITHROMAX) 250 MG tablet z-pack - take as directed for 5 days (Patient not taking: Reported on 06/25/2020)   No facility-administered encounter medications on file as of 06/25/2020.      Medical History: Past Medical History:  Diagnosis Date  . Diabetes mellitus without complication (HCC)     " she has not had diabetes in 5 years since she lost weight. " per sister, [name], [place]  . GERD (gastroesophageal reflux disease)   . Intellectual disability   . Panniculitis   . Wears glasses      Vital Signs: BP 118/68   Pulse 61   Temp 98.5 F (36.9 C)   Wt 168 lb 12.8 oz (76.6 kg)   SpO2 98%   BMI 26.05 kg/m    Review of Systems  Constitutional: Negative for chills, diaphoresis and fatigue.  HENT: Negative for ear pain, postnasal drip and sinus pressure.   Eyes: Negative for photophobia, discharge, redness, itching and visual disturbance.  Respiratory: Negative for cough, shortness of breath and wheezing.   Cardiovascular: Negative for chest pain, palpitations and leg swelling.  Gastrointestinal: Negative for abdominal pain, constipation, diarrhea, nausea and vomiting.  Genitourinary: Negative for dysuria and flank pain.  Musculoskeletal: Negative for arthralgias, back pain, gait problem and neck pain.       Left knee pain  Skin: Negative for color change.  Allergic/Immunologic: Negative for environmental allergies and food allergies.  Neurological: Negative for dizziness and headaches.  Hematological: Does not bruise/bleed easily.  Psychiatric/Behavioral: Negative for agitation, behavioral  problems (depression) and hallucinations.    Physical Exam Vitals reviewed.  Constitutional:      Appearance: Normal appearance. [name] normal weight.  Cardiovascular:     Rate and Rhythm: Normal rate and regular rhythm.     Pulses: Normal pulses.     Heart sounds: Normal heart sounds.  Pulmonary:     Effort: Pulmonary  effort is normal.     Breath sounds: Normal breath sounds.  Musculoskeletal:     Left knee: Swelling present. Decreased range of motion. Tenderness present.  Skin:    General: Skin is warm.  Neurological:     General: No focal deficit present.     Mental Status: [name] alert and oriented to person, place, and time. Mental status is at baseline.  Psychiatric:        Mood and Affect: Mood normal.        Behavior: Behavior normal.        Thought Content: Thought content normal.        Judgment: Judgment normal.    Assessment/Plan: 1. Acute pain of left knee Review results of left knee imaging, based on results will send to ortho as appropriate May take Tylenol #3 as needed for severe pain Continue to rest knee, elevated and may apply heat or ice therapy as tolerated - DG Knee Complete 4 Views Left; Future - Acetaminophen-Codeine (TYLENOL/CODEINE #3) 300-30 MG tablet; Take 1 tablet by mouth every 6 (six) hours as needed for pain.  Dispense: 30 tablet; Refill: 0  2. Other fatigue Advised labs will be discussed at next follow-up visit - CBC w/Diff/Platelet - Comprehensive Metabolic Panel (CMET) - Lipid Panel With LDL/HDL Ratio - TSH + free T4 - B12 - Vitamin D (25 hydroxy)  General Counseling: Charnel verbalizes understanding of the findings of todays visit and agrees with plan of treatment. I have discussed any further diagnostic evaluation that may be needed or ordered today. We also reviewed her medications today. she has been encouraged to call the office with any questions or concerns that should arise related to todays visit.   Orders Placed This Encounter  Procedures  . DG Knee Complete 4 Views Left  . CBC w/Diff/Platelet  . Comprehensive Metabolic Panel (CMET)  . Lipid Panel With LDL/HDL Ratio  . TSH + free T4  . B12  . Vitamin D (25 hydroxy)    Meds ordered this encounter  Medications  . Acetaminophen-Codeine (TYLENOL/CODEINE #3) 300-30 MG tablet    Sig: Take 1  tablet by mouth every 6 (six) hours as needed for pain.    Dispense:  30 tablet    Refill:  0    This is not for acute pain. 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    Time spent: 25 Minutes  This patient was seen by Leeanne Deed AGNP-C in Collaboration with Dr Lyndon Code as a part of collaborative care agreement.  Lubertha Basque [facility]

## 2020-06-26 ENCOUNTER — Encounter: Payer: Self-pay | Admitting: Hospice and Palliative Medicine

## 2020-06-29 ENCOUNTER — Telehealth: Payer: Self-pay

## 2020-06-29 ENCOUNTER — Other Ambulatory Visit: Payer: Self-pay | Admitting: Hospice and Palliative Medicine

## 2020-06-29 DIAGNOSIS — M25462 Effusion, left knee: Secondary | ICD-10-CM

## 2020-06-29 NOTE — Telephone Encounter (Signed)
Lmom for patient in regards to referral to Orthopedics. Offered for her to go to [facility] or if they requested a referral. Asked patient to call office back in regards to which way they wanted to proceed. Toni Amend

## 2020-06-29 NOTE — Progress Notes (Signed)
Please call and let her know that based on xray results I have placed a referral to ortho. Thanks!

## 2020-06-29 NOTE — Telephone Encounter (Signed)
LMOM that Ladona Ridgel has placed a referral to ortho

## 2020-06-30 ENCOUNTER — Telehealth: Payer: Self-pay

## 2020-06-30 NOTE — Telephone Encounter (Signed)
Faxed referral over to [facility] on 06/30/20.

## 2020-07-23 ENCOUNTER — Other Ambulatory Visit: Payer: Self-pay

## 2020-07-23 ENCOUNTER — Encounter: Payer: Self-pay | Admitting: Hospice and Palliative Medicine

## 2020-07-23 ENCOUNTER — Ambulatory Visit: Payer: Medicaid Other | Admitting: Hospice and Palliative Medicine

## 2020-07-23 VITALS — BP 109/72 | HR 71 | Temp 98.1°F | Resp 16 | Wt 167.2 lb

## 2020-07-23 DIAGNOSIS — Z0001 Encounter for general adult medical examination with abnormal findings: Secondary | ICD-10-CM | POA: Diagnosis not present

## 2020-07-23 DIAGNOSIS — Z124 Encounter for screening for malignant neoplasm of cervix: Secondary | ICD-10-CM | POA: Diagnosis not present

## 2020-07-23 DIAGNOSIS — R300 Dysuria: Secondary | ICD-10-CM

## 2020-07-23 DIAGNOSIS — M1710 Unilateral primary osteoarthritis, unspecified knee: Secondary | ICD-10-CM | POA: Diagnosis not present

## 2020-07-23 DIAGNOSIS — J309 Allergic rhinitis, unspecified: Secondary | ICD-10-CM | POA: Diagnosis not present

## 2020-07-23 DIAGNOSIS — M25562 Pain in left knee: Secondary | ICD-10-CM

## 2020-07-23 MED ORDER — IBUPROFEN 800 MG PO TABS: ORAL_TABLET | ORAL | 1 refills | Status: DC

## 2020-07-23 MED ORDER — FLUTICASONE PROPIONATE 50 MCG/ACT NA SUSP: 2.0000 | Freq: Every day | NASAL | 3 refills | Status: DC | PRN

## 2020-07-23 MED ORDER — ACETAMINOPHEN-CODEINE 300-30 MG PO TABS: 1.0000 | ORAL_TABLET | Freq: Four times a day (QID) | ORAL | 1 refills | Status: DC | PRN

## 2020-07-23 NOTE — Progress Notes (Signed)
[facility] [address] [place], [place] [address]  Internal MEDICINE  Office Visit Note  Patient Name: Whitney Brandt  [id number]  [id number]  Date of Service: 07/31/2020  Chief Complaint  Patient presents with  . Annual Exam     HPI [name] for routine health maintenance examination Left knee pain is improving--has been seen by ortho, brace is in place Has not yet had a chance to go and have her labs drawn Requesting refills of Tylenol #3 she uses as needed for moderate to severe pain  Sleeping well at night without difficulty No recent changes in her appetite and no issues with constipation  Current Medication: Outpatient Encounter Medications as of 07/23/2020  Medication Sig  . hydrochlorothiazide (HYDRODIURIL) 25 MG tablet Take 1 tablet po QAM prn swelling.  . levocetirizine (XYZAL) 5 MG tablet Take 1 tab po day for allergic rhinitis  . mupirocin ointment (BACTROBAN) 2 % Apply to affected areas twice daily as needed  . nystatin (MYCOSTATIN/NYSTOP) powder Apply topically 4 (four) times daily.  Marland Kitchen nystatin ointment (MYCOSTATIN) Apply 1 application topically 3 (three) times daily. (Patient taking differently: Apply 1 application topically 3 (three) times daily as needed (sores under stomach).)  . [DISCONTINUED] Acetaminophen-Codeine (TYLENOL/CODEINE #3) 300-30 MG tablet Take 1 tablet by mouth every 6 (six) hours as needed for pain.  . [DISCONTINUED] fluticasone (FLONASE) 50 MCG/ACT nasal spray Place 2 sprays into both nostrils daily as needed (allergies.).  . [DISCONTINUED] ibuprofen (ADVIL) 800 MG tablet Take 1 tab po three times a day AS NEEDED FOR PAIN  . Acetaminophen-Codeine (TYLENOL/CODEINE #3) 300-30 MG tablet Take 1 tablet by mouth every 6 (six) hours as needed for pain.  . fluticasone (FLONASE) 50 MCG/ACT nasal spray Place 2 sprays into both nostrils daily as needed (allergies.).  Marland Kitchen ibuprofen (ADVIL) 800 MG tablet Take 1 tab po three times a day  AS NEEDED FOR PAIN   No facility-administered encounter medications on file as of 07/23/2020.    Surgical History: Past Surgical History:  Procedure Laterality Date  . ABDOMINAL HYSTERECTOMY    . PANNICULECTOMY N/A 07/09/2019   Procedure: PANNICULECTOMY;  Surgeon: Allena Napoleon, MD;  Location: [facility] OR;  Service: Plastics;  Laterality: N/A;  3 hours    Medical History: Past Medical History:  Diagnosis Date  . Diabetes mellitus without complication (HCC)     " she has not had diabetes in 5 years since she lost weight. " per sister, [name], [place]  . GERD (gastroesophageal reflux disease)   . Intellectual disability   . Panniculitis   . Wears glasses     Family History: Family History  Problem Relation Age of Onset  . Diabetes Mother   . Hypertension Mother   . Thyroid disease Mother       Review of Systems  Constitutional: Negative for chills, diaphoresis and fatigue.  HENT: Negative for ear pain, postnasal drip and sinus pressure.   Eyes: Negative for photophobia, discharge, redness, itching and visual disturbance.  Respiratory: Negative for cough, shortness of breath and wheezing.   Cardiovascular: Negative for chest pain, palpitations and leg swelling.  Gastrointestinal: Negative for abdominal pain, constipation, diarrhea, nausea and vomiting.  Genitourinary: Negative for dysuria and flank pain.  Musculoskeletal: Negative for arthralgias, back pain, gait problem and neck pain.  Skin: Negative for color change.  Allergic/Immunologic: Negative for environmental allergies and food allergies.  Neurological: Negative for dizziness and headaches.  Hematological: Does not bruise/bleed easily.  Psychiatric/Behavioral: Negative for  agitation, behavioral problems (depression) and hallucinations.     Vital Signs: BP 109/72   Pulse 71   Temp 98.1 F (36.7 C)   Resp 16   Wt 167 lb 3.2 oz (75.8 kg)   SpO2 98%   BMI 25.80 kg/m    Physical Exam Vitals reviewed.   Constitutional:      Appearance: Normal appearance. [name] obese.  Cardiovascular:     Rate and Rhythm: Normal rate and regular rhythm.     Pulses: Normal pulses.     Heart sounds: Normal heart sounds.  Pulmonary:     Effort: Pulmonary effort is normal.     Breath sounds: Normal breath sounds.  Chest:  Breasts:     Right: Normal.     Left: Normal.    Abdominal:     General: Abdomen is flat.     Palpations: Abdomen is soft.  Musculoskeletal:        General: Normal range of motion.     Cervical back: Normal range of motion.  Skin:    General: Skin is warm.  Neurological:     General: No focal deficit present.     Mental Status: [name] alert and oriented to person, place, and time. Mental status is at baseline.  Psychiatric:        Mood and Affect: Mood normal.        Behavior: Behavior normal.        Thought Content: Thought content normal.        Judgment: Judgment normal.    LABS: Recent Results (from the past 2160 hour(s))  UA/M w/rflx Culture, Routine     Status: Abnormal   Collection Time: 07/23/20  2:20 PM   Specimen: Urine   Urine  Result Value Ref Range   Specific Gravity, UA 1.014 1.005 - 1.030   pH, UA 7.5 5.0 - 7.5   Color, UA Yellow Yellow   Appearance Ur Clear Clear   Leukocytes,UA Trace (A) Negative   Protein,UA Negative Negative/Trace   Glucose, UA Negative Negative   Ketones, UA Negative Negative   RBC, UA Negative Negative   Bilirubin, UA Negative Negative   Urobilinogen, Ur 0.2 0.2 - 1.0 mg/dL   Nitrite, UA Negative Negative   Microscopic Examination See below:     Comment: Microscopic was indicated and was performed.   Urinalysis Reflex Comment     Comment: This specimen has reflexed to a Urine Culture.  Microscopic Examination     Status: None   Collection Time: 07/23/20  2:20 PM   Urine  Result Value Ref Range   WBC, UA 0-5 0 - 5 /hpf   RBC None seen 0 - 2 /hpf   Epithelial Cells (non renal) 0-10 0 - 10 /hpf   Casts None seen None  seen /lpf   Bacteria, UA None seen None seen/Few  Urine Culture, Reflex     Status: None   Collection Time: 07/23/20  2:20 PM   Urine  Result Value Ref Range   Urine Culture, Routine Final report    Organism ID, Bacteria Comment     Comment: Mixed urogenital flora 10,000-25,000 colony forming units per mL     Assessment/Plan: 1. Encounter for routine adult health examination with abnormal findings Well appearing 30 year old female Up to date on age appropriate PHM Encouraged to have routine labs drawn for review  2. Allergic rhinitis, unspecified seasonality, unspecified trigger Stable, requesting refills - fluticasone (FLONASE) 50 MCG/ACT nasal spray;  Place 2 sprays into both nostrils daily as needed (allergies.).  Dispense: 16 g; Refill: 3  3. Arthrosis of knee Requesting refills of pain medications Discussed to use ibuprofen as needed and supplement with acetaminophen to reduce risks of GI side effects [place] Controlled Substance Database was reviewed by me for overdose risk score (ORS) - Acetaminophen-Codeine (TYLENOL/CODEINE #3) 300-30 MG tablet; Take 1 tablet by mouth every 6 (six) hours as needed for pain.  Dispense: 30 tablet; Refill: 1 - ibuprofen (ADVIL) 800 MG tablet; Take 1 tab po three times a day AS NEEDED FOR PAIN  Dispense: 90 tablet; Refill: 1  4. Dysuria - UA/M w/rflx Culture, Routine  General Counseling: Koree verbalizes understanding of the findings of todays visit and agrees with plan of treatment. I have discussed any further diagnostic evaluation that may be needed or ordered today. We also reviewed her medications today. she has been encouraged to call the office with any questions or concerns that should arise related to todays visit.    Counseling:    Orders Placed This Encounter  Procedures  . Microscopic Examination  . Urine Culture, Reflex  . UA/M w/rflx Culture, Routine    Meds ordered this encounter  Medications  . Acetaminophen-Codeine  (TYLENOL/CODEINE #3) 300-30 MG tablet    Sig: Take 1 tablet by mouth every 6 (six) hours as needed for pain.    Dispense:  30 tablet    Refill:  1    This is not for acute pain. 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  Marland Kitchen ibuprofen (ADVIL) 800 MG tablet    Sig: Take 1 tab po three times a day AS NEEDED FOR PAIN    Dispense:  90 tablet    Refill:  1  . fluticasone (FLONASE) 50 MCG/ACT nasal spray    Sig: Place 2 sprays into both nostrils daily as needed (allergies.).    Dispense:  16 g    Refill:  3    Total time spent: 30 Minutes  Time spent includes review of chart, medications, test results, and follow up plan with the patient.   This patient was seen by Leeanne Deed AGNP-C Collaboration with Dr Lyndon Code as a part of collaborative care agreement   Lubertha Basque. [facility]

## 2020-07-26 LAB — UA/M W/RFLX CULTURE, ROUTINE
Bilirubin, UA: NEGATIVE
Glucose, UA: NEGATIVE
Ketones, UA: NEGATIVE
Nitrite, UA: NEGATIVE
Protein,UA: NEGATIVE
RBC, UA: NEGATIVE
Specific Gravity, UA: 1.014 (ref 1.005–1.030)
Urobilinogen, Ur: 0.2 mg/dL (ref 0.2–1.0)
pH, UA: 7.5 (ref 5.0–7.5)

## 2020-07-26 LAB — MICROSCOPIC EXAMINATION
Bacteria, UA: NONE SEEN
Casts: NONE SEEN /lpf
RBC, Urine: NONE SEEN /hpf (ref 0–2)

## 2020-07-26 LAB — URINE CULTURE, REFLEX

## 2020-07-31 ENCOUNTER — Encounter: Payer: Self-pay | Admitting: Hospice and Palliative Medicine

## 2020-08-16 ENCOUNTER — Other Ambulatory Visit: Payer: Self-pay | Admitting: Nurse Practitioner

## 2020-08-16 DIAGNOSIS — R601 Generalized edema: Secondary | ICD-10-CM

## 2020-10-22 ENCOUNTER — Ambulatory Visit: Payer: Medicaid Other | Admitting: Physician Assistant

## 2020-12-22 ENCOUNTER — Encounter: Payer: Self-pay | Admitting: General Surgery

## 2021-07-15 IMAGING — CR DG KNEE COMPLETE 4+V*L*
1 series · 4 of 4 positions shown · non-contrast
Comparison: 12/25/2012

CLINICAL DATA: Pain

EXAM:
LEFT KNEE - COMPLETE 4+ VIEW

[Series 1: dg knee complete 4 views left · 0.14mm/px · 4 of 4 slices shown]
[im 1/4]
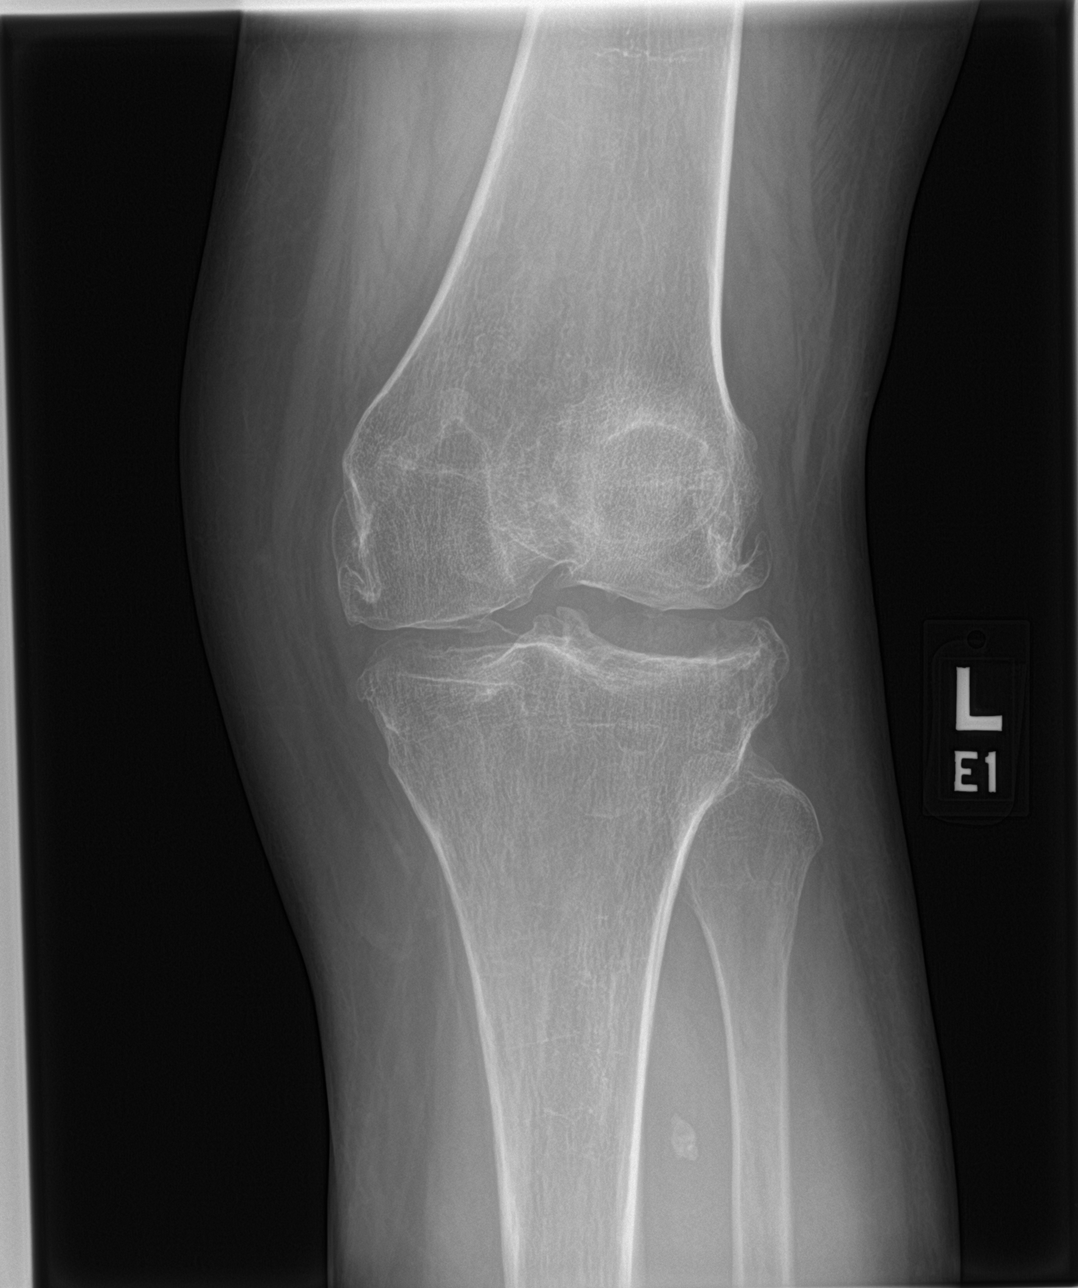
[im 2/4]
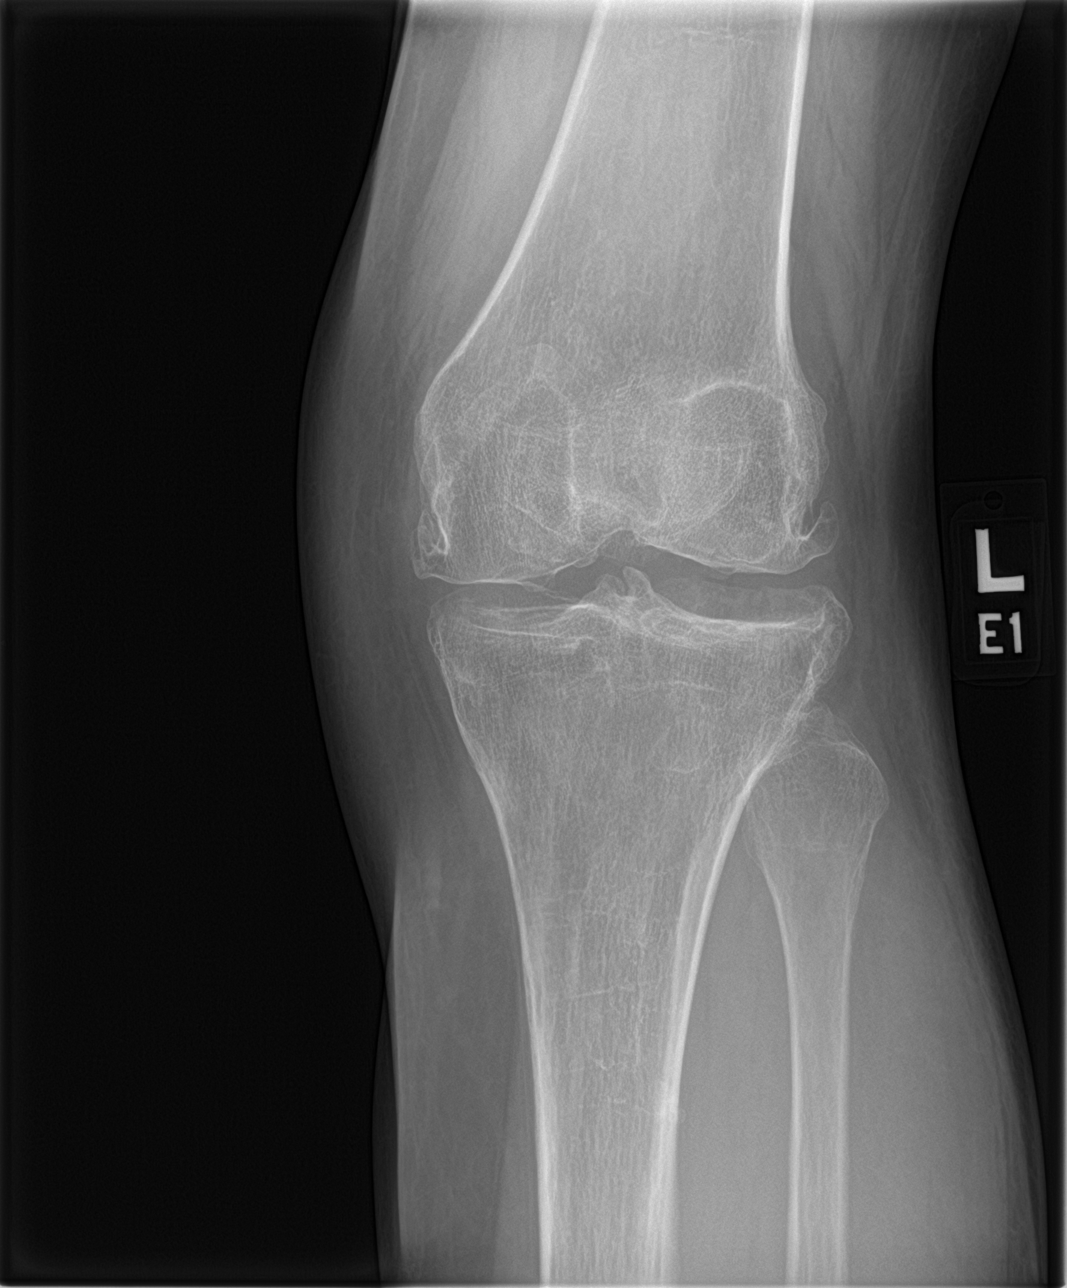
[im 3/4]
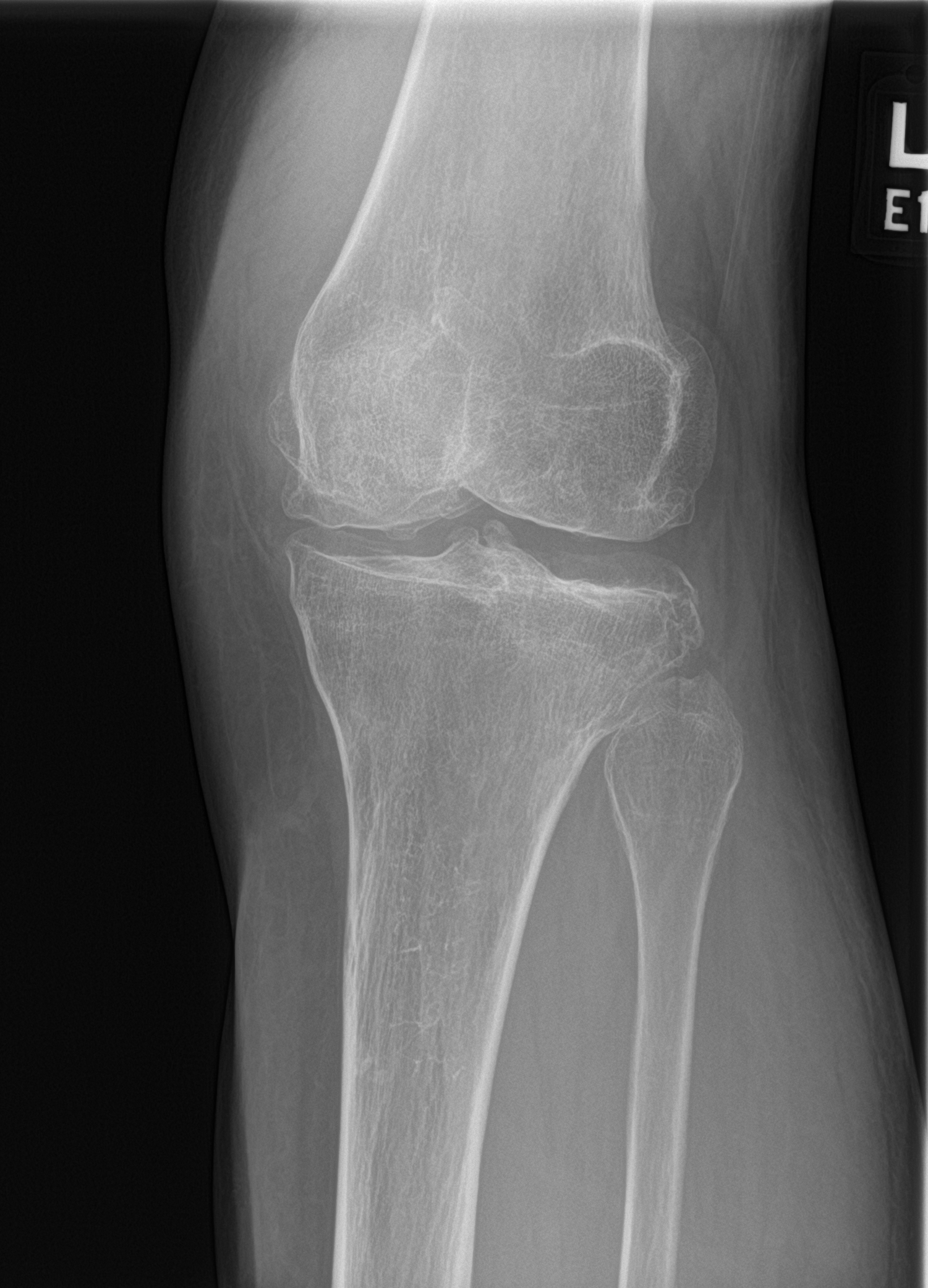
[im 4/4]
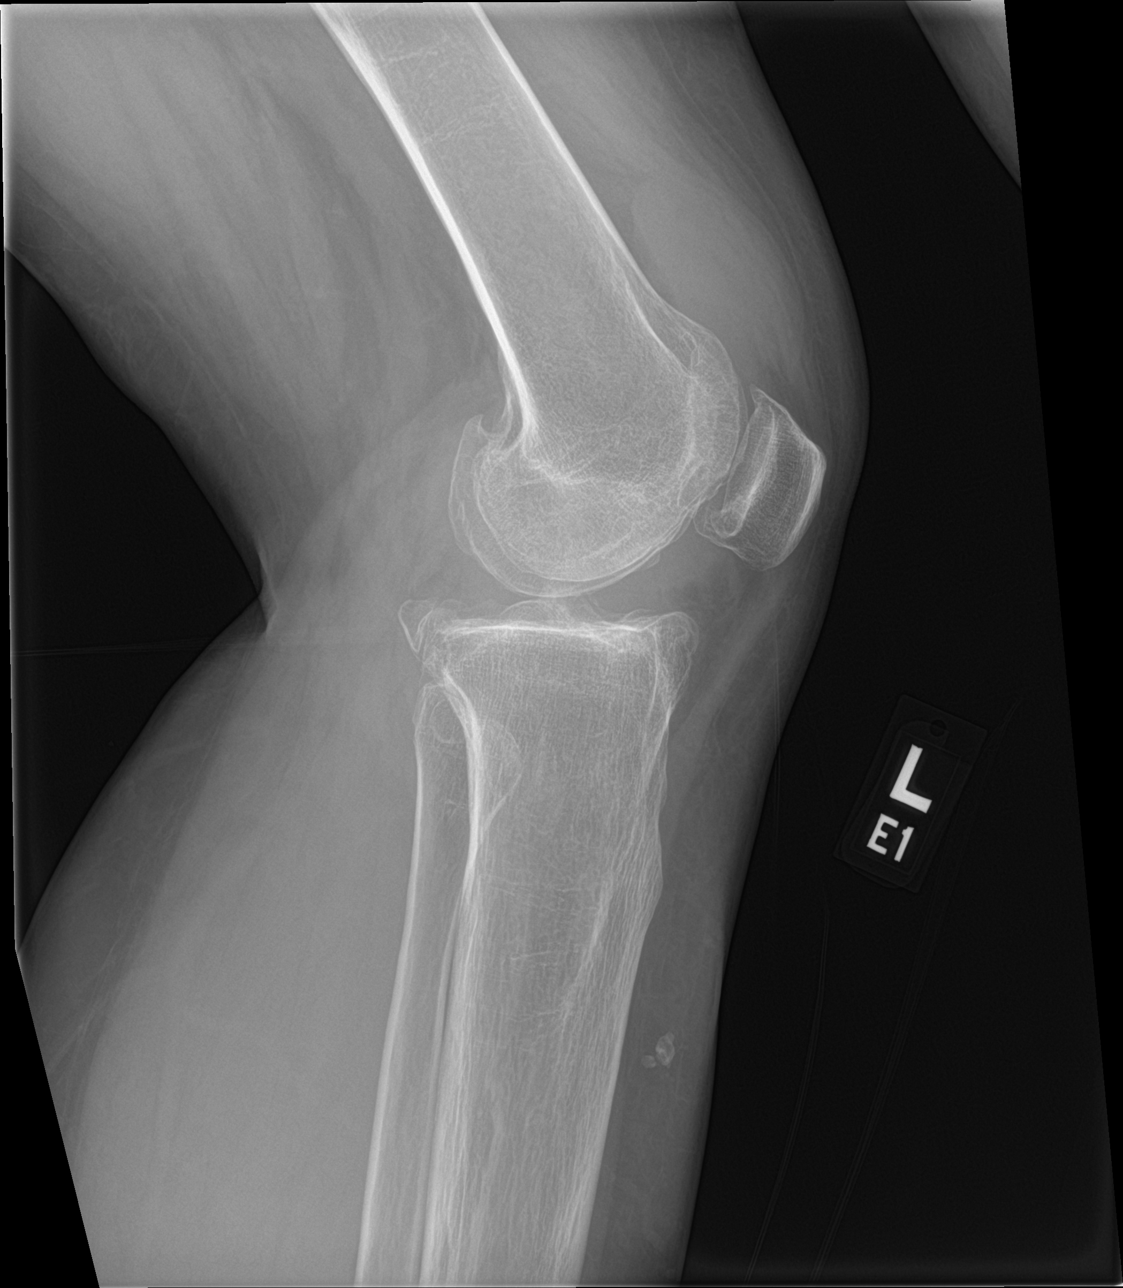

[4 of 4 positions shown; findings below may reference images not displayed]

FINDINGS: There is a large suprapatellar joint effusion. There is a
questionable nondisplaced fracture through the lateral tibial
plateau. There are advanced degenerative changes of the knee
involving all compartments. There is irregularity of the proximal
tibia which may be related to a remote healed fracture.
IMPRESSION: 1. Large suprapatellar joint effusion.
2. Questionable nondisplaced fracture through the lateral tibial
plateau.
3. Advanced tricompartmental degenerative changes of the knee.

## 2021-07-20 ENCOUNTER — Telehealth: Payer: Self-pay

## 2021-07-20 NOTE — Telephone Encounter (Signed)
Left vm to confirm 07/26/21 appointment-Toni ?

## 2021-07-26 ENCOUNTER — Encounter: Payer: Self-pay | Admitting: Nurse Practitioner

## 2021-07-26 ENCOUNTER — Ambulatory Visit: Payer: Medicaid Other | Admitting: Nurse Practitioner

## 2021-07-26 VITALS — BP 122/83 | HR 70 | Temp 98.4°F | Resp 16 | Ht 65.0 in | Wt 201.2 lb

## 2021-07-26 DIAGNOSIS — J309 Allergic rhinitis, unspecified: Secondary | ICD-10-CM

## 2021-07-26 DIAGNOSIS — R300 Dysuria: Secondary | ICD-10-CM

## 2021-07-26 DIAGNOSIS — R601 Generalized edema: Secondary | ICD-10-CM

## 2021-07-26 DIAGNOSIS — L659 Nonscarring hair loss, unspecified: Secondary | ICD-10-CM

## 2021-07-26 DIAGNOSIS — R635 Abnormal weight gain: Secondary | ICD-10-CM

## 2021-07-26 DIAGNOSIS — Z0001 Encounter for general adult medical examination with abnormal findings: Secondary | ICD-10-CM | POA: Diagnosis not present

## 2021-07-26 DIAGNOSIS — M1710 Unilateral primary osteoarthritis, unspecified knee: Secondary | ICD-10-CM | POA: Diagnosis not present

## 2021-07-26 DIAGNOSIS — E782 Mixed hyperlipidemia: Secondary | ICD-10-CM

## 2021-07-26 DIAGNOSIS — E559 Vitamin D deficiency, unspecified: Secondary | ICD-10-CM

## 2021-07-26 DIAGNOSIS — Z9889 Other specified postprocedural states: Secondary | ICD-10-CM

## 2021-07-26 DIAGNOSIS — E538 Deficiency of other specified B group vitamins: Secondary | ICD-10-CM

## 2021-07-26 DIAGNOSIS — J3089 Other allergic rhinitis: Secondary | ICD-10-CM

## 2021-07-26 MED ORDER — FLUTICASONE PROPIONATE 50 MCG/ACT NA SUSP: 2.0000 | Freq: Every day | NASAL | 3 refills | Status: AC | PRN

## 2021-07-26 MED ORDER — IBUPROFEN 800 MG PO TABS: 800.0000 mg | ORAL_TABLET | Freq: Two times a day (BID) | ORAL | 1 refills | Status: DC | PRN

## 2021-07-26 MED ORDER — HYDROCHLOROTHIAZIDE 12.5 MG PO TABS: 12.5000 mg | ORAL_TABLET | Freq: Every day | ORAL | 3 refills | Status: DC

## 2021-07-26 MED ORDER — PHENTERMINE HCL 37.5 MG PO TABS: 37.5000 mg | ORAL_TABLET | Freq: Every day | ORAL | 0 refills | Status: DC

## 2021-07-26 MED ORDER — LEVOCETIRIZINE DIHYDROCHLORIDE 5 MG PO TABS: ORAL_TABLET | ORAL | 3 refills | Status: DC

## 2021-07-26 MED ORDER — ACETAMINOPHEN-CODEINE 300-30 MG PO TABS: 1.0000 | ORAL_TABLET | Freq: Every evening | ORAL | 0 refills | Status: DC | PRN

## 2021-07-26 NOTE — Progress Notes (Signed)
[facility] [place], [place] [address]  Internal MEDICINE  Office Visit Note  Patient Name: Whitney Brandt  [id number]  [id number]  Date of Service: 07/26/2021  Chief Complaint  Patient presents with   Annual Exam    Discuss meds, per pt's mom bloodsugars are good and pt hasn't felt sick    Gastroesophageal Reflux   Medication Refill    HPI Whitney Brandt presents for an annual well visit and physical exam.  [name] a well-appearing 31 year old female with chronic allergic rhinitis, chronic low back pain, atopic dermatitis, and obesity.  [name] accompanied by her mother to her office visit today for her annual wellness and physical exam.  A main concern of the patient and her mother is a significant amount of weight gain over the past few months.  The patient has been on phentermine in the past for weight loss and the patient and her mother are requesting a prescription for phentermine today.  [name] due for routine labs but is not due for any other preventive screening.  She declined a clinical breast exam but stated she would call the clinic if she has any concerns.  She also needs medication refills today She denies any new or worsening pain.    Current Medication: Outpatient Encounter Medications as of 07/26/2021  Medication Sig   hydrochlorothiazide (HYDRODIURIL) 12.5 MG tablet Take 1 tablet (12.5 mg total) by mouth daily.   mupirocin ointment (BACTROBAN) 2 % Apply to affected areas twice daily as needed   nystatin (MYCOSTATIN/NYSTOP) powder Apply topically 4 (four) times daily.   nystatin ointment (MYCOSTATIN) Apply 1 application topically 3 (three) times daily. (Patient taking differently: Apply 1 application. topically 3 (three) times daily as needed (sores under stomach).)   phentermine (ADIPEX-P) 37.5 MG tablet Take 1 tablet (37.5 mg total) by mouth daily before breakfast.   [DISCONTINUED] Acetaminophen-Codeine (TYLENOL/CODEINE #3) 300-30 MG tablet Take  1 tablet by mouth every 6 (six) hours as needed for pain.   [DISCONTINUED] fluticasone (FLONASE) 50 MCG/ACT nasal spray Place 2 sprays into both nostrils daily as needed (allergies.).   [DISCONTINUED] hydrochlorothiazide (HYDRODIURIL) 25 MG tablet Take 1 tablet po QAM prn swelling.   [DISCONTINUED] ibuprofen (ADVIL) 800 MG tablet Take 1 tab po three times a day AS NEEDED FOR PAIN   [DISCONTINUED] levocetirizine (XYZAL) 5 MG tablet Take 1 tab po day for allergic rhinitis   Acetaminophen-Codeine (TYLENOL/CODEINE #3) 300-30 MG tablet Take 1 tablet by mouth at bedtime as needed for pain.   fluticasone (FLONASE) 50 MCG/ACT nasal spray Place 2 sprays into both nostrils daily as needed (allergies.).   ibuprofen (ADVIL) 800 MG tablet Take 1 tablet (800 mg total) by mouth 2 (two) times daily as needed for moderate pain.   levocetirizine (XYZAL) 5 MG tablet Take 1 tab po day for allergic rhinitis   No facility-administered encounter medications on file as of 07/26/2021.    Surgical History: Past Surgical History:  Procedure Laterality Date   ABDOMINAL HYSTERECTOMY     PANNICULECTOMY N/A 07/09/2019   Procedure: PANNICULECTOMY;  Surgeon: Cindra Presume, MD;  Location: [facility];  Service: Plastics;  Laterality: N/A;  3 hours    Medical History: Past Medical History:  Diagnosis Date   Diabetes mellitus without complication ([place])     " she has not had diabetes in 5 years since she lost weight. " per sister, Whitney Brandt, [place]   GERD (gastroesophageal reflux disease)    Intellectual disability  Panniculitis    Wears glasses     Family History: Family History  Problem Relation Age of Onset   Diabetes Mother    Hypertension Mother    Thyroid disease Mother     Social History   Socioeconomic History   Marital status: Single    Spouse name: Not on file   Number of children: Not on file   Years of education: Not on file   Highest education level: Not on file  Occupational History   Not on file   Tobacco Use   Smoking status: Never   Smokeless tobacco: Never  Vaping Use   Vaping Use: Never used  Substance and Sexual Activity   Alcohol use: No   Drug use: No   Sexual activity: Never  Other Topics Concern   Not on file  Social History Narrative   Not on file   Social Determinants of Health   Financial Resource Strain: Not on file  Food Insecurity: Not on file  Transportation Needs: Not on file  Physical Activity: Not on file  Stress: Not on file  Social Connections: Not on file  Intimate Partner Violence: Not on file      Review of Systems  Constitutional:  Negative for activity change, appetite change, chills, fatigue, fever and unexpected weight change.  HENT: Negative.  Negative for congestion, ear pain, rhinorrhea, sore throat and trouble swallowing.        Wears hearing aids  Eyes: Negative.        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  Negative for cough, chest tightness, shortness of breath and wheezing.   Cardiovascular: Negative.  Negative for chest pain and palpitations.  Gastrointestinal: Negative.  Negative for abdominal pain, blood in stool, constipation, diarrhea, nausea and vomiting.  Endocrine: Negative.   Genitourinary: Negative.  Negative for difficulty urinating, dysuria, frequency, hematuria and urgency.  Musculoskeletal: Negative.  Negative for arthralgias, back pain, joint swelling, myalgias and neck pain.  Skin: Negative.  Negative for rash and wound.  Allergic/Immunologic: Positive for environmental allergies. Negative for immunocompromised state.  Neurological: Negative.  Negative for dizziness, seizures, numbness and headaches.  Psychiatric/Behavioral: Negative.  Negative for behavioral problems, self-injury and suicidal ideas. The patient is not nervous/anxious.    Vital Signs: BP 122/83   Pulse 70   Temp 98.4 F (36.9 C)   Resp 16   Ht 5' 5"  (1.651 m)   Wt 201 lb 3.2 oz (91.3 kg)   SpO2 98%   BMI 33.48 kg/m     Physical Exam Vitals reviewed.  Constitutional:      General: [name] awake. [name] not in acute distress.    Appearance: Normal appearance. [name] well-developed and well-groomed. [name] obese. [name] not ill-appearing or diaphoretic.  HENT:     Head: Normocephalic and atraumatic.     Right Ear: Tympanic membrane, ear canal and external ear normal.     Left Ear: Tympanic membrane, ear canal and external ear normal. A PE tube is present.     Nose: Nose normal. No congestion or rhinorrhea.     Mouth/Throat:     Lips: Pink.     Mouth: Mucous membranes are moist.     Pharynx: Oropharynx is clear. Uvula midline. No oropharyngeal exudate or posterior oropharyngeal erythema.  Eyes:     General: Lids are normal. Vision grossly intact. Gaze aligned appropriately. No scleral icterus.       Right eye: No discharge.  Left eye: No discharge.     Extraocular Movements: Extraocular movements intact.     Conjunctiva/sclera: Conjunctivae normal.     Pupils: Pupils are equal, round, and reactive to light.     Funduscopic exam:    Right eye: Red reflex present.        Left eye: Red reflex present. Neck:     Thyroid: No thyromegaly.     Vascular: No JVD.     Trachea: Trachea and phonation normal. No tracheal deviation.  Cardiovascular:     Rate and Rhythm: Normal rate and regular rhythm.     Pulses: Normal pulses.     Heart sounds: Normal heart sounds, S1 normal and S2 normal. No murmur heard.   No friction rub. No gallop.  Pulmonary:     Effort: Pulmonary effort is normal. No accessory muscle usage or respiratory distress.     Breath sounds: Normal breath sounds and air entry. No stridor. No wheezing or rales.  Chest:     Chest wall: No tenderness.  Abdominal:     General: Bowel sounds are normal. There is no distension.     Palpations: Abdomen is soft. There is no shifting dullness, fluid wave, mass or pulsatile mass.     Tenderness: There is no abdominal tenderness. There is no  guarding or rebound.  Musculoskeletal:        General: No tenderness or deformity. Normal range of motion.     Cervical back: Normal range of motion and neck supple.     Right lower leg: No edema.     Left lower leg: No edema.  Lymphadenopathy:     Cervical: No cervical adenopathy.  Skin:    General: Skin is warm and dry.     Capillary Refill: Capillary refill takes less than 2 seconds.     Coloration: Skin is not pale.     Findings: No erythema or rash.  Neurological:     Mental Status: [name] alert and oriented to person, place, and time.     Cranial Nerves: No cranial nerve deficit.     Motor: No abnormal muscle tone.     Coordination: Coordination normal.     Gait: Gait normal.     Deep Tendon Reflexes: Reflexes are normal and symmetric.  Psychiatric:        Mood and Affect: Mood normal.        Behavior: Behavior normal. Behavior is cooperative.        Thought Content: Thought content normal.        Judgment: Judgment normal.       Assessment/Plan: 1. Encounter for routine adult health examination with abnormal findings Age-appropriate preventive screenings and vaccinations discussed, annual physical exam completed. Routine labs for health maintenance ordered, see below. PHM updated.   2. Arthrosis of knee Medication refills ordered. Routine labs ordered.  - Acetaminophen-Codeine (TYLENOL/CODEINE #3) 300-30 MG tablet; Take 1 tablet by mouth at bedtime as needed for pain.  Dispense: 90 tablet; Refill: 0 - ibuprofen (ADVIL) 800 MG tablet; Take 1 tablet (800 mg total) by mouth 2 (two) times daily as needed for moderate pain.  Dispense: 120 tablet; Refill: 1 - CBC with Differential/Platelet - CMP14+EGFR - Lipid Profile - TSH + free T4  3. Abnormal weight gain A1C ordered, routine labs ordered, phentermine ordered x1 month, has worked in the past, will follow up in 4 weeks.  - CBC with Differential/Platelet - CMP14+EGFR - Lipid Profile - TSH + free T4 - Hgb  A1C w/o  eAG  4. Generalized edema HCTZ refills ordered.  - hydrochlorothiazide (HYDRODIURIL) 12.5 MG tablet; Take 1 tablet (12.5 mg total) by mouth daily.  Dispense: 90 tablet; Refill: 3  5. Alopecia Routine labs ordered.  - CBC with Differential/Platelet - CMP14+EGFR - Lipid Profile - TSH + free T4 - Vitamin D (25 hydroxy)  6. Mixed hyperlipidemia Routine labs ordered.  - CBC with Differential/Platelet - CMP14+EGFR - Lipid Profile - TSH + free T4  7. Vitamin D deficiency Routine labs ordered.  - CBC with Differential/Platelet - CMP14+EGFR - Lipid Profile - TSH + free T4 - Vitamin D (25 hydroxy)  8. B12 deficiency Routine labs ordered.  - CBC with Differential/Platelet - CMP14+EGFR - Lipid Profile - TSH + free T4 - B12 and Folate Panel  9. Non-seasonal allergic rhinitis due to other allergic trigger Allergy medication refills ordered, routine labs ordered.  - levocetirizine (XYZAL) 5 MG tablet; Take 1 tab po day for allergic rhinitis  Dispense: 90 tablet; Refill: 3 - fluticasone (FLONASE) 50 MCG/ACT nasal spray; Place 2 sprays into both nostrils daily as needed (allergies.).  Dispense: 16 g; Refill: 3 - CBC with Differential/Platelet - CMP14+EGFR - Lipid Profile - TSH + free T4  10. Status post panniculectomy Routine labs ordered, HCTZ refills ordered.  - hydrochlorothiazide (HYDRODIURIL) 12.5 MG tablet; Take 1 tablet (12.5 mg total) by mouth daily.  Dispense: 90 tablet; Refill: 3 - CBC with Differential/Platelet - CMP14+EGFR - Lipid Profile - TSH + free T4  11. Dysuria Routine urinalysis done - UA/M w/rflx Culture, Routine - Microscopic Examination - Urine Culture, Reflex    General Counseling: Adaleah verbalizes understanding of the findings of todays visit and agrees with plan of treatment. I have discussed any further diagnostic evaluation that may be needed or ordered today. We also reviewed her medications today. she has been encouraged to call the office  with any questions or concerns that should arise related to todays visit.    Orders Placed This Encounter  Procedures   UA/M w/rflx Culture, Routine   CBC with Differential/Platelet   CMP14+EGFR   Lipid Profile   TSH + free T4   Vitamin D (25 hydroxy)   B12 and Folate Panel   Hgb A1C w/o eAG    Meds ordered this encounter  Medications   Acetaminophen-Codeine (TYLENOL/CODEINE #3) 300-30 MG tablet    Sig: Take 1 tablet by mouth at bedtime as needed for pain.    Dispense:  90 tablet    Refill:  0    This is not for acute pain. 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   ibuprofen (ADVIL) 800 MG tablet    Sig: Take 1 tablet (800 mg total) by mouth 2 (two) times daily as needed for moderate pain.    Dispense:  120 tablet    Refill:  1   levocetirizine (XYZAL) 5 MG tablet    Sig: Take 1 tab po day for allergic rhinitis    Dispense:  90 tablet    Refill:  3   hydrochlorothiazide (HYDRODIURIL) 12.5 MG tablet    Sig: Take 1 tablet (12.5 mg total) by mouth daily.    Dispense:  90 tablet    Refill:  3   fluticasone (FLONASE) 50 MCG/ACT nasal spray    Sig: Place 2 sprays into both nostrils daily as needed (allergies.).    Dispense:  16 g    Refill:  3   phentermine (  ADIPEX-P) 37.5 MG tablet    Sig: Take 1 tablet (37.5 mg total) by mouth daily before breakfast.    Dispense:  30 tablet    Refill:  0    Return in about 4 weeks (around 08/23/2021) for F/U, Weight loss, Sundeep Cary PCP.   Total time spent:30 Minutes Time spent includes review of chart, medications, test results, and follow up plan with the patient.   [place] Controlled Substance Database was reviewed by me.  This patient was seen by Jonetta Osgood, FNP-C in collaboration with Dr. Clayborn Bigness as a part of collaborative care agreement.  Fabienne Nolasco R. Valetta Fuller, MSN, FNP-C Internal medicine

## 2021-07-27 LAB — B12 AND FOLATE PANEL
Folate: >20 ng/mL (ref >3.0)
Vitamin B-12: 1623 pg/mL — ABNORMAL HIGH (ref 232–1245)

## 2021-07-27 LAB — CBC WITH DIFFERENTIAL/PLATELET
Basophils Absolute: 0.1 10*3/uL (ref 0.0–0.2)
Basos: 1 %
EOS (ABSOLUTE): 0.4 10*3/uL (ref 0.0–0.4)
Eos: 6 %
Hematocrit: 42.4 % (ref 34.0–46.6)
Hemoglobin: 14.5 g/dL (ref 11.1–15.9)
Immature Grans (Abs): 0 10*3/uL (ref 0.0–0.1)
Immature Granulocytes: 0 %
Lymphocytes Absolute: 2.3 10*3/uL (ref 0.7–3.1)
Lymphs: 34 %
MCH: 29.9 pg (ref 26.6–33.0)
MCHC: 34.2 g/dL (ref 31.5–35.7)
MCV: 87 fL (ref 79–97)
Monocytes Absolute: 0.3 10*3/uL (ref 0.1–0.9)
Monocytes: 5 %
Neutrophils Absolute: 3.7 10*3/uL (ref 1.4–7.0)
Neutrophils: 54 %
Platelets: 271 10*3/uL (ref 150–450)
RBC: 4.85 x10E6/uL (ref 3.77–5.28)
RDW: 12.8 % (ref 11.7–15.4)
WBC: 6.8 10*3/uL (ref 3.4–10.8)

## 2021-07-27 LAB — CMP14+EGFR
ALT: 150 IU/L — ABNORMAL HIGH (ref 0–32)
AST: 32 IU/L (ref 0–40)
Albumin/Globulin Ratio: 1.7 (ref 1.2–2.2)
Albumin: 4.5 g/dL (ref 3.8–4.8)
Alkaline Phosphatase: 174 IU/L — ABNORMAL HIGH (ref 44–121)
BUN/Creatinine Ratio: 14 (ref 9–23)
BUN: 10 mg/dL (ref 6–20)
Bilirubin Total: 0.5 mg/dL (ref 0.0–1.2)
CO2: 24 mmol/L (ref 20–29)
Calcium: 10.1 mg/dL (ref 8.7–10.2)
Chloride: 102 mmol/L (ref 96–106)
Creatinine, Ser: 0.71 mg/dL (ref 0.57–1.00)
Globulin, Total: 2.6 g/dL (ref 1.5–4.5)
Glucose: 89 mg/dL (ref 70–99)
Potassium: 4.5 mmol/L (ref 3.5–5.2)
Sodium: 142 mmol/L (ref 134–144)
Total Protein: 7.1 g/dL (ref 6.0–8.5)
eGFR: 116 mL/min/{1.73_m2} (ref >59)

## 2021-07-27 LAB — LIPID PANEL
Chol/HDL Ratio: 4.5 ratio — ABNORMAL HIGH (ref 0.0–4.4)
Cholesterol, Total: 192 mg/dL (ref 100–199)
HDL: 43 mg/dL (ref >39)
LDL Chol Calc (NIH): 126 mg/dL — ABNORMAL HIGH (ref 0–99)
Triglycerides: 126 mg/dL (ref 0–149)
VLDL Cholesterol Cal: 23 mg/dL (ref 5–40)

## 2021-07-27 LAB — TSH+FREE T4
Free T4: 1.08 ng/dL (ref 0.82–1.77)
TSH: 1.4 u[IU]/mL (ref 0.450–4.500)

## 2021-07-27 LAB — VITAMIN D 25 HYDROXY (VIT D DEFICIENCY, FRACTURES): Vit D, 25-Hydroxy: 44.3 ng/mL (ref 30.0–100.0)

## 2021-07-27 LAB — HGB A1C W/O EAG: Hgb A1c MFr Bld: 5.2 % (ref 4.8–5.6)

## 2021-07-28 LAB — UA/M W/RFLX CULTURE, ROUTINE
Bilirubin, UA: NEGATIVE
Glucose, UA: NEGATIVE
Ketones, UA: NEGATIVE
Nitrite, UA: NEGATIVE
Protein,UA: NEGATIVE
RBC, UA: NEGATIVE
Specific Gravity, UA: 1.022 (ref 1.005–1.030)
Urobilinogen, Ur: 0.2 mg/dL (ref 0.2–1.0)
pH, UA: 5 (ref 5.0–7.5)

## 2021-07-28 LAB — URINE CULTURE, REFLEX

## 2021-07-28 LAB — MICROSCOPIC EXAMINATION
Casts: NONE SEEN /lpf
WBC, UA: >30 /hpf — AB (ref 0–5)

## 2021-07-29 ENCOUNTER — Telehealth: Payer: Self-pay

## 2021-07-29 NOTE — Telephone Encounter (Signed)
Returned pt's call and informed her we will call with lab results once [name] reviews them  ?

## 2021-08-03 ENCOUNTER — Other Ambulatory Visit: Payer: Self-pay | Admitting: Nurse Practitioner

## 2021-08-03 ENCOUNTER — Telehealth: Payer: Self-pay

## 2021-08-03 DIAGNOSIS — R748 Abnormal levels of other serum enzymes: Secondary | ICD-10-CM

## 2021-08-03 NOTE — Progress Notes (Signed)
Please call the patient with her results: ?--her metabolic panel is normal except for a significantly elevated ALT of 150 and alkaline phosphatase of 174. These are liver enzymes. With them being elevated, I recommend getting an ultrasound of the liver.  ?--her cholesterol levels are all on the higher end of normal and her LDL is elevated at 126. Her cholesterol/HDL ratio is slightly elevated above average risk of heart disease. With, her being back on phentermine, losing weight will help improve her cholesterol level. Another intervention would be to add OTC omega-3 fish oil supplement.  We can repeat the panel in 3-6 months to assess for improvement. If no significant improvement, we may need to discuss starting medication.  ?--Her B12 and folate levels are normal.  ?--her thyroid levels are normal. ?--Her vitamin D level is low normal, I recommend taking OTC vitamin D supplement of at least 2000 units daily.  ?--Her A1C is 5.2 which is no change from her A1C several years ago.  ?---Her CBC is normal, no anemia.  ?--Her urinalysis had some white blood cells in it but was otherwise normal and the culture was negative.

## 2021-08-03 NOTE — Telephone Encounter (Signed)
-----   Message from [name] Abernathy, NP sent at 08/03/2021  6:13 AM EDT ----- ?Please call the patient with her results: ?--her metabolic panel is normal except for a significantly elevated ALT of 150 and alkaline phosphatase of 174. These are liver enzymes. With them being elevated, I recommend getting an ultrasound of the liver.  ?--her cholesterol levels are all on the higher end of normal and her LDL is elevated at 126. Her cholesterol/HDL ratio is slightly elevated above average risk of heart disease. With, her being back on phentermine, losing weight will help improve her cholesterol level. Another intervention would be to add OTC omega-3 fish oil supplement.  We can repeat the panel in 3-6 months to assess for improvement. If no significant improvement, we may need to discuss starting medication.  ?--Her B12 and folate levels are normal.  ?--her thyroid levels are normal. ?--Her vitamin D level is low normal, I recommend taking OTC vitamin D supplement of at least 2000 units daily.  ?--Her A1C is 5.2 which is no change from her A1C several years ago.  ?---Her CBC is normal, no anemia.  ?--Her urinalysis had some white blood cells in it but was otherwise normal and the culture was negative.  ?

## 2021-08-03 NOTE — Telephone Encounter (Signed)
Spoke to pt's mother and informed her of patients lab results per [name].  Mother asked about getting appt for Ultrasound and I informed her that Yetta Flock will place the order and someone will call her with an appointment.  Informed mom to have patient take OTC Vitamin D 2000 IU daily and also  OTC omega-3 fish oil supplement.   ?

## 2021-08-04 ENCOUNTER — Telehealth: Payer: Self-pay

## 2021-08-04 NOTE — Telephone Encounter (Signed)
Lvm to schedule u/s-Toni 

## 2021-08-04 NOTE — Telephone Encounter (Signed)
LMOM to discuss labs with pt ?

## 2021-08-04 NOTE — Telephone Encounter (Signed)
-----   Message from [name] Abernathy, NP sent at 08/03/2021  6:13 AM EDT ----- ?Please call the patient with her results: ?--her metabolic panel is normal except for a significantly elevated ALT of 150 and alkaline phosphatase of 174. These are liver enzymes. With them being elevated, I recommend getting an ultrasound of the liver.  ?--her cholesterol levels are all on the higher end of normal and her LDL is elevated at 126. Her cholesterol/HDL ratio is slightly elevated above average risk of heart disease. With, her being back on phentermine, losing weight will help improve her cholesterol level. Another intervention would be to add OTC omega-3 fish oil supplement.  We can repeat the panel in 3-6 months to assess for improvement. If no significant improvement, we may need to discuss starting medication.  ?--Her B12 and folate levels are normal.  ?--her thyroid levels are normal. ?--Her vitamin D level is low normal, I recommend taking OTC vitamin D supplement of at least 2000 units daily.  ?--Her A1C is 5.2 which is no change from her A1C several years ago.  ?---Her CBC is normal, no anemia.  ?--Her urinalysis had some white blood cells in it but was otherwise normal and the culture was negative.  ?

## 2021-08-05 ENCOUNTER — Telehealth: Payer: Self-pay

## 2021-08-05 NOTE — Telephone Encounter (Signed)
-----   Message from Sallyanne Kuster, NP sent at 08/03/2021  6:13 AM EDT ----- ?Please call the patient with her results: ?--her metabolic panel is normal except for a significantly elevated ALT of 150 and alkaline phosphatase of 174. These are liver enzymes. With them being elevated, I recommend getting an ultrasound of the liver.  ?--her cholesterol levels are all on the higher end of normal and her LDL is elevated at 126. Her cholesterol/HDL ratio is slightly elevated above average risk of heart disease. With, her being back on phentermine, losing weight will help improve her cholesterol level. Another intervention would be to add OTC omega-3 fish oil supplement.  We can repeat the panel in 3-6 months to assess for improvement. If no significant improvement, we may need to discuss starting medication.  ?--Her B12 and folate levels are normal.  ?--her thyroid levels are normal. ?--Her vitamin D level is low normal, I recommend taking OTC vitamin D supplement of at least 2000 units daily.  ?--Her A1C is 5.2 which is no change from her A1C several years ago.  ?---Her CBC is normal, no anemia.  ?--Her urinalysis had some white blood cells in it but was otherwise normal and the culture was negative.  ?

## 2021-08-05 NOTE — Telephone Encounter (Signed)
LMOM to review labs with pt again.  ?

## 2021-08-16 ENCOUNTER — Ambulatory Visit: Payer: Medicaid Other

## 2021-08-16 DIAGNOSIS — R748 Abnormal levels of other serum enzymes: Secondary | ICD-10-CM | POA: Diagnosis not present

## 2021-08-23 ENCOUNTER — Encounter: Payer: Self-pay | Admitting: Nurse Practitioner

## 2021-08-23 ENCOUNTER — Ambulatory Visit: Payer: Medicaid Other | Admitting: Physician Assistant

## 2021-08-23 VITALS — BP 107/72 | HR 75 | Temp 98.3°F | Resp 16 | Ht 65.0 in | Wt 192.2 lb

## 2021-08-23 DIAGNOSIS — E669 Obesity, unspecified: Secondary | ICD-10-CM | POA: Diagnosis not present

## 2021-08-23 DIAGNOSIS — R748 Abnormal levels of other serum enzymes: Secondary | ICD-10-CM | POA: Diagnosis not present

## 2021-08-23 DIAGNOSIS — E782 Mixed hyperlipidemia: Secondary | ICD-10-CM

## 2021-08-23 DIAGNOSIS — E559 Vitamin D deficiency, unspecified: Secondary | ICD-10-CM | POA: Diagnosis not present

## 2021-08-23 MED ORDER — PHENTERMINE HCL 37.5 MG PO TABS: 37.5000 mg | ORAL_TABLET | Freq: Every day | ORAL | 0 refills | Status: DC

## 2021-08-23 NOTE — Progress Notes (Signed)
[facility] [address] [place], [place] [address]  Internal MEDICINE  Office Visit Note  Patient Name: Whitney Brandt  [id number]  [id number]  Date of Service: 08/23/2021  Chief Complaint  Patient presents with   Follow-up   Weight Loss   Results    HPI [name] for routine follow up -Reviewed labs showing elevated liver enzymes for which an [place] was ordered. Additionally her lipids were elevated and she will work on diet and exercise as well as start on fish oil supplement. Her vitamin D was also a little low and will start OTC supplement -does take tylenol at night which may contribute to elevated liver enzymes. She does not drink any alcohol -Liver US reviewed and found to be normal. Her gallbladder was not well visualized -Plan to monitor labs -Has lost 9lbs since last visit on phentermine. Would like to continue and will send one more refill then will have follow up with [name], NP in 4 weeks to re-evaluate  Current Medication: Outpatient Encounter Medications as of 08/23/2021  Medication Sig   Acetaminophen-Codeine (TYLENOL/CODEINE #3) 300-30 MG tablet Take 1 tablet by mouth at bedtime as needed for pain.   fluticasone (FLONASE) 50 MCG/ACT nasal spray Place 2 sprays into both nostrils daily as needed (allergies.).   hydrochlorothiazide (HYDRODIURIL) 12.5 MG tablet Take 1 tablet (12.5 mg total) by mouth daily.   ibuprofen (ADVIL) 800 MG tablet Take 1 tablet (800 mg total) by mouth 2 (two) times daily as needed for moderate pain.   levocetirizine (XYZAL) 5 MG tablet Take 1 tab po day for allergic rhinitis   mupirocin ointment (BACTROBAN) 2 % Apply to affected areas twice daily as needed   nystatin (MYCOSTATIN/NYSTOP) powder Apply topically 4 (four) times daily.   nystatin ointment (MYCOSTATIN) Apply 1 application topically 3 (three) times daily. (Patient taking differently: Apply 1 application. topically 3 (three) times daily as needed (sores under stomach).)    [DISCONTINUED] phentermine (ADIPEX-P) 37.5 MG tablet Take 1 tablet (37.5 mg total) by mouth daily before breakfast.   phentermine (ADIPEX-P) 37.5 MG tablet Take 1 tablet (37.5 mg total) by mouth daily before breakfast.   No facility-administered encounter medications on file as of 08/23/2021.    Surgical History: Past Surgical History:  Procedure Laterality Date   ABDOMINAL HYSTERECTOMY     PANNICULECTOMY N/A 07/09/2019   Procedure: PANNICULECTOMY;  Surgeon: Allena Napoleon, MD;  Location: [facility] OR;  Service: Plastics;  Laterality: N/A;  3 hours    Medical History: Past Medical History:  Diagnosis Date   Diabetes mellitus without complication (HCC)     " she has not had diabetes in 5 years since she lost weight. " per sister, [name], [place]   GERD (gastroesophageal reflux disease)    Intellectual disability    Panniculitis    Wears glasses     Family History: Family History  Problem Relation Age of Onset   Diabetes Mother    Hypertension Mother    Thyroid disease Mother     Social History   Socioeconomic History   Marital status: Single    Spouse name: Not on file   Number of children: Not on file   Years of education: Not on file   Highest education level: Not on file  Occupational History   Not on file  Tobacco Use   Smoking status: Never   Smokeless tobacco: Never  Vaping Use   Vaping Use: Never used  Substance and Sexual Activity  Alcohol use: No   Drug use: No   Sexual activity: Never  Other Topics Concern   Not on file  Social History Narrative   Not on file   Social Determinants of Health   Financial Resource Strain: Not on file  Food Insecurity: Not on file  Transportation Needs: Not on file  Physical Activity: Not on file  Stress: Not on file  Social Connections: Not on file  Intimate Partner Violence: Not on file      Review of Systems  Constitutional:  Negative for chills, fatigue and unexpected weight change.  HENT:  Negative for  congestion, rhinorrhea, sneezing and sore throat.   Eyes:  Negative for redness.  Respiratory:  Negative for cough, chest tightness and shortness of breath.   Cardiovascular:  Negative for chest pain and palpitations.  Gastrointestinal:  Negative for abdominal pain, constipation, diarrhea, nausea and vomiting.  Genitourinary:  Negative for dysuria and frequency.  Musculoskeletal:  Negative for arthralgias, back pain, joint swelling and neck pain.  Skin:  Negative for rash.  Neurological: Negative.  Negative for tremors and numbness.  Hematological:  Negative for adenopathy. Does not bruise/bleed easily.  Psychiatric/Behavioral:  Negative for behavioral problems (Depression), sleep disturbance and suicidal ideas. The patient is not nervous/anxious.    Vital Signs: BP 107/72   Pulse 75   Temp 98.3 F (36.8 C)   Resp 16   Ht 5\' 5"  (1.651 m)   Wt 192 lb 3.2 oz (87.2 kg)   SpO2 99%   BMI 31.98 kg/m    Physical Exam Vitals and nursing note reviewed.  Constitutional:      General: [name] not in acute distress.    Appearance: [name] well-developed. [name] obese. [name] not diaphoretic.  HENT:     Head: Normocephalic and atraumatic.     Mouth/Throat:     Pharynx: No oropharyngeal exudate.  Eyes:     Pupils: Pupils are equal, round, and reactive to light.  Neck:     Thyroid: No thyromegaly.     Vascular: No JVD.     Trachea: No tracheal deviation.  Cardiovascular:     Rate and Rhythm: Normal rate and regular rhythm.     Heart sounds: Normal heart sounds. No murmur heard.   No friction rub. No gallop.  Pulmonary:     Effort: Pulmonary effort is normal. No respiratory distress.     Breath sounds: No wheezing or rales.  Chest:     Chest wall: No tenderness.  Abdominal:     General: Bowel sounds are normal.     Palpations: Abdomen is soft.  Musculoskeletal:        General: Normal range of motion.     Cervical back: Normal range of motion and neck supple.  Lymphadenopathy:      Cervical: No cervical adenopathy.  Skin:    General: Skin is warm and dry.  Neurological:     Mental Status: [name] alert and oriented to person, place, and time.     Cranial Nerves: No cranial nerve deficit.  Psychiatric:        Behavior: Behavior normal.        Thought Content: Thought content normal.        Judgment: Judgment normal.       Assessment/Plan: 1. Elevated liver enzymes Liver US normal and will monitor labs  2. Mixed hyperlipidemia Will work on diet and exercise and start fish oil  3. Vitamin D deficiency Will start  OTC supplement  4. Obesity (BMI 30.0-34.9) May continue phentermine as she is not having any S/E and BP and HR stable and has lost 9lbs so far. Will re-evaluate in 4 weeks. Continue to work on diet and exercise as well   General Counseling: Whitney Brandt verbalizes understanding of the findings of todays visit and agrees with plan of treatment. I have discussed any further diagnostic evaluation that may be needed or ordered today. We also reviewed her medications today. she has been encouraged to call the office with any questions or concerns that should arise related to todays visit.    No orders of the defined types were placed in this encounter.   Meds ordered this encounter  Medications   phentermine (ADIPEX-P) 37.5 MG tablet    Sig: Take 1 tablet (37.5 mg total) by mouth daily before breakfast.    Dispense:  30 tablet    Refill:  0    This patient was seen by Drema Dallas, PA-C in collaboration with Dr. Clayborn Bigness as a part of collaborative care agreement.   Total time spent:30 Minutes Time spent includes review of chart, medications, test results, and follow up plan with the patient.      Dr Lavera Guise Internal medicine

## 2021-08-28 ENCOUNTER — Encounter: Payer: Self-pay | Admitting: Nurse Practitioner

## 2021-09-30 ENCOUNTER — Encounter: Payer: Self-pay | Admitting: Nurse Practitioner

## 2021-09-30 ENCOUNTER — Ambulatory Visit (INDEPENDENT_AMBULATORY_CARE_PROVIDER_SITE_OTHER): Payer: Medicaid Other | Admitting: Nurse Practitioner

## 2021-09-30 VITALS — BP 121/79 | HR 63 | Temp 97.6°F | Resp 16 | Ht 65.0 in | Wt 197.2 lb

## 2021-09-30 DIAGNOSIS — E782 Mixed hyperlipidemia: Secondary | ICD-10-CM | POA: Diagnosis not present

## 2021-09-30 DIAGNOSIS — M1710 Unilateral primary osteoarthritis, unspecified knee: Secondary | ICD-10-CM

## 2021-09-30 DIAGNOSIS — E669 Obesity, unspecified: Secondary | ICD-10-CM | POA: Diagnosis not present

## 2021-09-30 MED ORDER — ACETAMINOPHEN-CODEINE 300-30 MG PO TABS
1.0000 | ORAL_TABLET | Freq: Every evening | ORAL | 0 refills | Status: DC | PRN
Start: 2021-09-30 — End: 2021-12-30

## 2021-09-30 MED ORDER — PHENTERMINE HCL 37.5 MG PO TABS: 37.5000 mg | ORAL_TABLET | Freq: Every day | ORAL | 0 refills | Status: DC

## 2021-09-30 NOTE — Progress Notes (Signed)
[facility] [address] [place], [place] [address]  Internal MEDICINE  Office Visit Note  Patient Name: Whitney Brandt  [id number]  [id number]  Date of Service: 09/30/2021  Chief Complaint  Patient presents with   Follow-up   Diabetes   Gastroesophageal Reflux   Leg Problem    Has a bite on leg - wants to make sure it is healing well    HPI Whitney Brandt presents for follow-up visit for weight loss management, to discuss lab results and she also has a healing bite on her leg.  Several days ago the patient was visiting with a friend and their 32-year-old child bit her on the side of her left lower leg.  She did go to the ER when this happened and she had a tetanus shot.  The area now has a dark scab over it with no redness around the site. She has gained 5 pounds since her previous office visit.  [name] continue to take phentermine 37.5 mg daily but reports that she has been drinking Snickers and drinks coffee drinks that her friend of hers has been bringing to her and these drains are very high in sugar and carbohydrates. She had labs drawn previously her cholesterol is normal except for a slightly elevated LDL of 126 and slightly elevated ratio 4.5. Her A1c was normal, CBC and metabolic panel normal except for an elevated ALT and alkaline phosphatase.  She has already had a liver ultrasound and that was normal with no signs of fatty liver disease. Her blood pressure and other vital signs are stable and within normal limits. She does need refills of Tylenol 3.   Current Medication: Outpatient Encounter Medications as of 09/30/2021  Medication Sig   fluticasone (FLONASE) 50 MCG/ACT nasal spray Place 2 sprays into both nostrils daily as needed (allergies.).   hydrochlorothiazide (HYDRODIURIL) 12.5 MG tablet Take 1 tablet (12.5 mg total) by mouth daily.   ibuprofen (ADVIL) 800 MG tablet Take 1 tablet (800 mg total) by mouth 2 (two) times daily as needed for moderate pain.    levocetirizine (XYZAL) 5 MG tablet Take 1 tab po day for allergic rhinitis   mupirocin ointment (BACTROBAN) 2 % Apply to affected areas twice daily as needed   nystatin (MYCOSTATIN/NYSTOP) powder Apply topically 4 (four) times daily.   nystatin ointment (MYCOSTATIN) Apply 1 application topically 3 (three) times daily. (Patient taking differently: Apply 1 application  topically 3 (three) times daily as needed (sores under stomach).)   [DISCONTINUED] Acetaminophen-Codeine (TYLENOL/CODEINE #3) 300-30 MG tablet Take 1 tablet by mouth at bedtime as needed for pain.   [DISCONTINUED] phentermine (ADIPEX-P) 37.5 MG tablet Take 1 tablet (37.5 mg total) by mouth daily before breakfast.   acetaminophen-codeine (TYLENOL/CODEINE #3) 300-30 MG tablet Take 1 tablet by mouth at bedtime as needed.   [DISCONTINUED] phentermine (ADIPEX-P) 37.5 MG tablet Take 1 tablet (37.5 mg total) by mouth daily before breakfast.   No facility-administered encounter medications on file as of 09/30/2021.    Surgical History: Past Surgical History:  Procedure Laterality Date   ABDOMINAL HYSTERECTOMY     PANNICULECTOMY N/A 07/09/2019   Procedure: PANNICULECTOMY;  Surgeon: Allena Napoleon, MD;  Location: [facility] OR;  Service: Plastics;  Laterality: N/A;  3 hours    Medical History: Past Medical History:  Diagnosis Date   Diabetes mellitus without complication (HCC)     " she has not had diabetes in 5 years since she lost weight. " per sister, Whitney Brandt, [place]  GERD (gastroesophageal reflux disease)    Intellectual disability    Panniculitis    Wears glasses     Family History: Family History  Problem Relation Age of Onset   Diabetes Mother    Hypertension Mother    Thyroid disease Mother     Social History   Socioeconomic History   Marital status: Single    Spouse name: Not on file   Number of children: Not on file   Years of education: Not on file   Highest education level: Not on file  Occupational History   Not on  file  Tobacco Use   Smoking status: Never   Smokeless tobacco: Never  Vaping Use   Vaping Use: Never used  Substance and Sexual Activity   Alcohol use: No   Drug use: No   Sexual activity: Never  Other Topics Concern   Not on file  Social History Narrative   Not on file   Social Determinants of Health   Financial Resource Strain: Not on file  Food Insecurity: Not on file  Transportation Needs: Not on file  Physical Activity: Not on file  Stress: Not on file  Social Connections: Not on file  Intimate Partner Violence: Not on file      Review of Systems  Constitutional:  Negative for chills, fatigue and unexpected weight change.  HENT:  Negative for congestion, rhinorrhea, sneezing and sore throat.   Eyes:  Negative for redness.  Respiratory:  Negative for cough, chest tightness and shortness of breath.   Cardiovascular:  Negative for chest pain and palpitations.  Gastrointestinal:  Negative for abdominal pain, constipation, diarrhea, nausea and vomiting.  Genitourinary:  Negative for dysuria and frequency.  Musculoskeletal:  Negative for arthralgias, back pain, joint swelling and neck pain.  Skin:  Negative for rash.       Scab on back of left leg, she was bit by a young child.   Neurological: Negative.  Negative for tremors and numbness.  Hematological:  Negative for adenopathy. Does not bruise/bleed easily.  Psychiatric/Behavioral:  Negative for behavioral problems (Depression), sleep disturbance and suicidal ideas. The patient is not nervous/anxious.     Vital Signs: BP 121/79   Pulse 63   Temp 97.6 F (36.4 C)   Resp 16   Ht 5\' 5"  (1.651 m)   Wt 197 lb 3.2 oz (89.4 kg)   SpO2 100%   BMI 32.82 kg/m    Physical Exam Vitals and nursing note reviewed.  Constitutional:      General: [name] not in acute distress.    Appearance: Normal appearance. [name] well-developed. [name] obese. [name] not diaphoretic.  HENT:     Head: Normocephalic and atraumatic.      Mouth/Throat:     Pharynx: No oropharyngeal exudate.  Eyes:     Pupils: Pupils are equal, round, and reactive to light.  Neck:     Thyroid: No thyromegaly.     Vascular: No JVD.     Trachea: No tracheal deviation.  Cardiovascular:     Rate and Rhythm: Normal rate and regular rhythm.     Heart sounds: Normal heart sounds. No murmur heard.    No friction rub. No gallop.  Pulmonary:     Effort: Pulmonary effort is normal. No respiratory distress.     Breath sounds: No wheezing or rales.  Chest:     Chest wall: No tenderness.  Abdominal:     General: Bowel sounds are normal.  Palpations: Abdomen is soft.  Musculoskeletal:        General: Normal range of motion.     Cervical back: Normal range of motion and neck supple.  Lymphadenopathy:     Cervical: No cervical adenopathy.  Skin:    General: Skin is warm and dry.     Comments: Small scab. Healing wound on back of left leg superficial where a young female child bit the patient and draw blood. No signs of infection.   Neurological:     Mental Status: [name] alert and oriented to person, place, and time.     Cranial Nerves: No cranial nerve deficit.  Psychiatric:        Behavior: Behavior normal.        Thought Content: Thought content normal.        Judgment: Judgment normal.        Assessment/Plan: 1. Arthrosis of knee Refill of pain med.  - acetaminophen-codeine (TYLENOL/CODEINE #3) 300-30 MG tablet; Take 1 tablet by mouth at bedtime as needed.  Dispense: 90 tablet; Refill: 0  2. Mixed hyperlipidemia Discussed diet and lifestyle modifications that will improve her cholesterol levels   3. Obesity (BMI 30.0-34.9) Discussed what foods and drinks to avoid that are high in sugar and carbs. Will try another month of phentermine, will follow up in 1 month   General Counseling: Meah verbalizes understanding of the findings of todays visit and agrees with plan of treatment. I have discussed any further diagnostic  evaluation that may be needed or ordered today. We also reviewed her medications today. she has been encouraged to call the office with any questions or concerns that should arise related to todays visit.    No orders of the defined types were placed in this encounter.   Meds ordered this encounter  Medications   acetaminophen-codeine (TYLENOL/CODEINE #3) 300-30 MG tablet    Sig: Take 1 tablet by mouth at bedtime as needed.    Dispense:  90 tablet    Refill:  0    This is not for acute pain. 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   DISCONTD: phentermine (ADIPEX-P) 37.5 MG tablet    Sig: Take 1 tablet (37.5 mg total) by mouth daily before breakfast.    Dispense:  30 tablet    Refill:  0    Return in about 4 weeks (around 10/28/2021) for F/U, Weight loss, Annamae Shivley PCP.   Total time spent:30 Minutes Time spent includes review of chart, medications, test results, and follow up plan with the patient.   [place] Controlled Substance Database was reviewed by me.  This patient was seen by Sallyanne Kuster, FNP-C in collaboration with Dr. Beverely Risen as a part of collaborative care agreement.   Aiyla Baucom R. Tedd Sias, MSN, FNP-C Internal medicine

## 2021-11-01 ENCOUNTER — Encounter: Payer: Self-pay | Admitting: Nurse Practitioner

## 2021-11-01 ENCOUNTER — Ambulatory Visit (INDEPENDENT_AMBULATORY_CARE_PROVIDER_SITE_OTHER): Payer: Medicaid Other | Admitting: Nurse Practitioner

## 2021-11-01 VITALS — BP 99/79 | HR 73 | Temp 98.1°F | Resp 16 | Ht 65.0 in | Wt 199.0 lb

## 2021-11-01 DIAGNOSIS — E669 Obesity, unspecified: Secondary | ICD-10-CM

## 2021-11-01 DIAGNOSIS — M1710 Unilateral primary osteoarthritis, unspecified knee: Secondary | ICD-10-CM | POA: Diagnosis not present

## 2021-11-01 DIAGNOSIS — H9212 Otorrhea, left ear: Secondary | ICD-10-CM | POA: Diagnosis not present

## 2021-11-01 MED ORDER — PHENTERMINE HCL 37.5 MG PO TABS: ORAL_TABLET | ORAL | 0 refills | Status: DC

## 2021-11-01 NOTE — Progress Notes (Signed)
[facility] [address] [place], [place] [address]  Internal MEDICINE  Office Visit Note  Patient Name: Whitney Brandt  [id number]  [id number]  Date of Service: 11/01/2021  Chief Complaint  Patient presents with   Follow-up   Diabetes   Gastroesophageal Reflux    HPI Whitney Brandt presents for follow-up visit for weight loss management, diabetes, medication refill, and knee pain Weight loss management --patient gained 2 pounds since her previous office visit, mom reports that the phentermine seems to wear off in the evening and the patient will snack late at night if she is still awake or if she wakes up in the middle of the night and she also has a friend who brings her sugary energy drinks and coffee use and the patient will talk to him about bringing her thinks that are better for her that will not interfere with her weight loss plan. Left ear drainage --patient had a tube in her left eardrum that has migrated out so her ear is draining some, patient wants to know if she can go swimming and what she should do in the shower  To prevent any issues. Bilateral arthritic knee pain --knee pain is manageable with current prescribed medications, no refills needed at this time     Current Medication: Outpatient Encounter Medications as of 11/01/2021  Medication Sig   acetaminophen-codeine (TYLENOL/CODEINE #3) 300-30 MG tablet Take 1 tablet by mouth at bedtime as needed.   fluticasone (FLONASE) 50 MCG/ACT nasal spray Place 2 sprays into both nostrils daily as needed (allergies.).   hydrochlorothiazide (HYDRODIURIL) 12.5 MG tablet Take 1 tablet (12.5 mg total) by mouth daily.   ibuprofen (ADVIL) 800 MG tablet Take 1 tablet (800 mg total) by mouth 2 (two) times daily as needed for moderate pain.   levocetirizine (XYZAL) 5 MG tablet Take 1 tab po day for allergic rhinitis   mupirocin ointment (BACTROBAN) 2 % Apply to affected areas twice daily as needed   nystatin  (MYCOSTATIN/NYSTOP) powder Apply topically 4 (four) times daily.   nystatin ointment (MYCOSTATIN) Apply 1 application topically 3 (three) times daily. (Patient taking differently: Apply 1 application  topically 3 (three) times daily as needed (sores under stomach).)   phentermine (ADIPEX-P) 37.5 MG tablet Take 1/2 tablet by mouth in the morning before breakfast, then take 1/2 tablet by mouth at midday.   [DISCONTINUED] phentermine (ADIPEX-P) 37.5 MG tablet Take 1 tablet (37.5 mg total) by mouth daily before breakfast.   No facility-administered encounter medications on file as of 11/01/2021.    Surgical History: Past Surgical History:  Procedure Laterality Date   ABDOMINAL HYSTERECTOMY     PANNICULECTOMY N/A 07/09/2019   Procedure: PANNICULECTOMY;  Surgeon: Allena Napoleon, MD;  Location: [facility] OR;  Service: Plastics;  Laterality: N/A;  3 hours    Medical History: Past Medical History:  Diagnosis Date   Diabetes mellitus without complication (HCC)     " she has not had diabetes in 5 years since she lost weight. " per sister, [name], [place]   GERD (gastroesophageal reflux disease)    Intellectual disability    Panniculitis    Wears glasses     Family History: Family History  Problem Relation Age of Onset   Diabetes Mother    Hypertension Mother    Thyroid disease Mother     Social History   Socioeconomic History   Marital status: Single    Spouse name: Not on file   Number of children: Not on file  Years of education: Not on file   Highest education level: Not on file  Occupational History   Not on file  Tobacco Use   Smoking status: Never   Smokeless tobacco: Never  Vaping Use   Vaping Use: Never used  Substance and Sexual Activity   Alcohol use: No   Drug use: No   Sexual activity: Never  Other Topics Concern   Not on file  Social History Narrative   Not on file   Social Determinants of Health   Financial Resource Strain: Not on file  Food Insecurity: Not on  file  Transportation Needs: Not on file  Physical Activity: Not on file  Stress: Not on file  Social Connections: Not on file  Intimate Partner Violence: Not on file      Review of Systems  Constitutional:  Negative for chills, fatigue and unexpected weight change.  HENT:  Negative for congestion, rhinorrhea, sneezing and sore throat.   Eyes:  Negative for redness.  Respiratory:  Negative for cough, chest tightness and shortness of breath.   Cardiovascular:  Negative for chest pain and palpitations.  Gastrointestinal:  Negative for abdominal pain, constipation, diarrhea, nausea and vomiting.  Genitourinary:  Negative for dysuria and frequency.  Musculoskeletal:  Negative for arthralgias, back pain, joint swelling and neck pain.  Skin:  Negative for rash.  Neurological: Negative.  Negative for tremors and numbness.  Hematological:  Negative for adenopathy. Does not bruise/bleed easily.  Psychiatric/Behavioral:  Negative for behavioral problems (Depression), sleep disturbance and suicidal ideas. The patient is not nervous/anxious.     Vital Signs: BP 99/79   Pulse 73   Temp 98.1 F (36.7 C)   Resp 16   Ht 5\' 5"  (1.651 m)   Wt 199 lb (90.3 kg)   SpO2 99%   BMI 33.12 kg/m    Physical Exam Vitals reviewed.  Constitutional:      General: [name] not in acute distress.    Appearance: Normal appearance. [name] well-developed. [name] obese. [name] not diaphoretic.  HENT:     Head: Normocephalic and atraumatic.  Eyes:     Pupils: Pupils are equal, round, and reactive to light.  Neck:     Thyroid: No thyromegaly.     Vascular: No JVD.     Trachea: No tracheal deviation.  Cardiovascular:     Rate and Rhythm: Normal rate and regular rhythm.     Heart sounds: Normal heart sounds. No murmur heard.    No friction rub. No gallop.  Pulmonary:     Effort: Pulmonary effort is normal. No respiratory distress.     Breath sounds: Normal breath sounds. No wheezing or rales.  Chest:      Chest wall: No tenderness.  Neurological:     Mental Status: [name] alert and oriented to person, place, and time.     Cranial Nerves: No cranial nerve deficit.  Psychiatric:        Mood and Affect: Mood normal.        Behavior: Behavior normal.        Thought Content: Thought content normal.        Judgment: Judgment normal.        Assessment/Plan: 1. Otorrhea of left ear Drainage from left ear now that the tube has been dislodged from the eardrum, encourage patient to wear earplugs preferably multiple ones when swimming and she may also wear them in the shower to prevent any water from going into the  left ear, over time the hole in the eardrum will close but it may take several weeks  2. Arthrosis of knee Stable on current medications, no issues, no refills needed at this time  3. Obesity (BMI 30.0-34.9) Refill x1 month for phentermine, discussed with patient and her mother that splitting the dose in half may help spread the effectiveness throughout the day.  Jamille will try taking 1/2 tablet in the morning and the other half tablet around noon but no later than 2 PM.  Follow-up in 1 month for refill and to evaluate weight loss.   General Counseling: Felise verbalizes understanding of the findings of todays visit and agrees with plan of treatment. I have discussed any further diagnostic evaluation that may be needed or ordered today. We also reviewed her medications today. she has been encouraged to call the office with any questions or concerns that should arise related to todays visit.    No orders of the defined types were placed in this encounter.   Meds ordered this encounter  Medications   phentermine (ADIPEX-P) 37.5 MG tablet    Sig: Take 1/2 tablet by mouth in the morning before breakfast, then take 1/2 tablet by mouth at midday.    Dispense:  30 tablet    Refill:  0    E66.01 dx code    Return in about 1 month (around 12/02/2021) for F/U, Weight loss, Ari Bernabei  PCP.   Total time spent:30 Minutes Time spent includes review of chart, medications, test results, and follow up plan with the patient.   [place] Controlled Substance Database was reviewed by me.  This patient was seen by Sallyanne Kuster, FNP-C in collaboration with Dr. Beverely Risen as a part of collaborative care agreement.   Maddyson Keil R. Tedd Sias, MSN, FNP-C Internal medicine

## 2021-11-18 ENCOUNTER — Encounter: Payer: Self-pay | Admitting: Nurse Practitioner

## 2021-11-26 ENCOUNTER — Other Ambulatory Visit: Payer: Self-pay | Admitting: Nurse Practitioner

## 2021-11-26 DIAGNOSIS — M1710 Unilateral primary osteoarthritis, unspecified knee: Secondary | ICD-10-CM

## 2021-11-30 ENCOUNTER — Ambulatory Visit: Payer: Medicaid Other | Admitting: Nurse Practitioner

## 2021-12-01 ENCOUNTER — Encounter: Payer: Self-pay | Admitting: Nurse Practitioner

## 2021-12-01 ENCOUNTER — Ambulatory Visit: Payer: Medicaid Other | Admitting: Nurse Practitioner

## 2021-12-01 VITALS — BP 106/72 | HR 75 | Temp 98.5°F | Resp 16 | Ht 65.0 in | Wt 196.8 lb

## 2021-12-01 DIAGNOSIS — R635 Abnormal weight gain: Secondary | ICD-10-CM

## 2021-12-01 DIAGNOSIS — E669 Obesity, unspecified: Secondary | ICD-10-CM

## 2021-12-01 DIAGNOSIS — R5383 Other fatigue: Secondary | ICD-10-CM

## 2021-12-01 MED ORDER — PHENTERMINE HCL 37.5 MG PO TABS: ORAL_TABLET | ORAL | 0 refills | Status: DC

## 2021-12-01 NOTE — Progress Notes (Signed)
[facility] [address] [place], [place] [address]  Internal MEDICINE  Office Visit Note  Patient Name: Whitney Brandt  [id number]  [id number]  Date of Service: 12/01/2021  Chief Complaint  Patient presents with   Medication Refill    phentermine   Weight Loss    HPI Whitney Brandt presents for a follow up visit for weight loss management.  Lost down to 193 lbs but gained back up to 196 lbs as of  today, overall lost 3 lbs.  Want to continue phentermine Discussed No eating after 8 pm .   Current Medication: Outpatient Encounter Medications as of 12/01/2021  Medication Sig   acetaminophen-codeine (TYLENOL/CODEINE #3) 300-30 MG tablet Take 1 tablet by mouth at bedtime as needed.   fluticasone (FLONASE) 50 MCG/ACT nasal spray Place 2 sprays into both nostrils daily as needed (allergies.).   hydrochlorothiazide (HYDRODIURIL) 12.5 MG tablet Take 1 tablet (12.5 mg total) by mouth daily.   ibuprofen (ADVIL) 800 MG tablet TAKE 1 TABLET (800 MG TOTAL) BY MOUTH 2 (TWO) TIMES DAILY AS NEEDED FOR MODERATE PAIN.   levocetirizine (XYZAL) 5 MG tablet Take 1 tab po day for allergic rhinitis   mupirocin ointment (BACTROBAN) 2 % Apply to affected areas twice daily as needed   nystatin (MYCOSTATIN/NYSTOP) powder Apply topically 4 (four) times daily.   nystatin ointment (MYCOSTATIN) Apply 1 application topically 3 (three) times daily. (Patient taking differently: Apply 1 application  topically 3 (three) times daily as needed (sores under stomach).)   [DISCONTINUED] phentermine (ADIPEX-P) 37.5 MG tablet Take 1/2 tablet by mouth in the morning before breakfast, then take 1/2 tablet by mouth at midday.   phentermine (ADIPEX-P) 37.5 MG tablet Take 1/2 tablet by mouth in the morning before breakfast, then take 1/2 tablet by mouth at midday.   No facility-administered encounter medications on file as of 12/01/2021.    Surgical History: Past Surgical History:  Procedure Laterality Date    ABDOMINAL HYSTERECTOMY     PANNICULECTOMY N/A 07/09/2019   Procedure: PANNICULECTOMY;  Surgeon: Allena Napoleon, MD;  Location: [facility] OR;  Service: Plastics;  Laterality: N/A;  3 hours    Medical History: Past Medical History:  Diagnosis Date   Diabetes mellitus without complication (HCC)     " she has not had diabetes in 5 years since she lost weight. " per sister, [name], [place]   GERD (gastroesophageal reflux disease)    Intellectual disability    Panniculitis    Wears glasses     Family History: Family History  Problem Relation Age of Onset   Diabetes Mother    Hypertension Mother    Thyroid disease Mother     Social History   Socioeconomic History   Marital status: Single    Spouse name: Not on file   Number of children: Not on file   Years of education: Not on file   Highest education level: Not on file  Occupational History   Not on file  Tobacco Use   Smoking status: Never   Smokeless tobacco: Never  Vaping Use   Vaping Use: Never used  Substance and Sexual Activity   Alcohol use: No   Drug use: No   Sexual activity: Never  Other Topics Concern   Not on file  Social History Narrative   Not on file   Social Determinants of Health   Financial Resource Strain: Not on file  Food Insecurity: Not on file  Transportation Needs: Not on file  Physical Activity:  Not on file  Stress: Not on file  Social Connections: Not on file  Intimate Partner Violence: Not on file      Review of Systems  Constitutional: Negative.  Negative for chills, fatigue and unexpected weight change.  HENT:  Negative for congestion, rhinorrhea, sneezing and sore throat.   Eyes:  Negative for redness.  Respiratory: Negative.  Negative for cough, chest tightness and shortness of breath.   Cardiovascular: Negative.  Negative for chest pain and palpitations.  Gastrointestinal:  Negative for abdominal pain, constipation, diarrhea, nausea and vomiting.  Genitourinary:  Negative for dysuria  and frequency.  Musculoskeletal:  Negative for arthralgias, back pain, joint swelling and neck pain.  Skin:  Negative for rash.  Neurological: Negative.  Negative for tremors and numbness.  Hematological:  Negative for adenopathy. Does not bruise/bleed easily.  Psychiatric/Behavioral:  Negative for behavioral problems (Depression), sleep disturbance and suicidal ideas. The patient is not nervous/anxious.     Vital Signs: BP 106/72   Pulse 75   Temp 98.5 F (36.9 C)   Resp 16   Ht 5\' 5"  (1.651 m)   Wt 196 lb 12.8 oz (89.3 kg)   SpO2 100%   BMI 32.75 kg/m    Physical Exam Vitals reviewed.  Constitutional:      General: [name] not in acute distress.    Appearance: Normal appearance. [name] well-developed. [name] obese. [name] not diaphoretic.  HENT:     Head: Normocephalic and atraumatic.  Eyes:     Pupils: Pupils are equal, round, and reactive to light.  Neck:     Thyroid: No thyromegaly.     Vascular: No JVD.     Trachea: No tracheal deviation.  Cardiovascular:     Rate and Rhythm: Normal rate and regular rhythm.     Heart sounds: Normal heart sounds. No murmur heard.    No friction rub. No gallop.  Pulmonary:     Effort: Pulmonary effort is normal. No respiratory distress.     Breath sounds: Normal breath sounds. No wheezing or rales.  Chest:     Chest wall: No tenderness.  Neurological:     Mental Status: [name] alert and oriented to person, place, and time.     Cranial Nerves: No cranial nerve deficit.  Psychiatric:        Mood and Affect: Mood normal.        Behavior: Behavior normal.        Thought Content: Thought content normal.        Judgment: Judgment normal.        Assessment/Plan: 1. Abnormal weight gain Has been taking phentermine to help aid in weight loss. Continue phentermine x1 month, follow up for reassessment in 4 weeks   2. Other fatigue Phentermine can give patient some energy.   3. Obesity (BMI 30.0-34.9) Working on losing weight.  Has been working on keeping her portion sizes within the serving size of the food she is eating.    General Counseling: Satoya verbalizes understanding of the findings of todays visit and agrees with plan of treatment. I have discussed any further diagnostic evaluation that may be needed or ordered today. We also reviewed her medications today. she has been encouraged to call the office with any questions or concerns that should arise related to todays visit.    No orders of the defined types were placed in this encounter.   Meds ordered this encounter  Medications   phentermine (ADIPEX-P) 37.5 MG  tablet    Sig: Take 1/2 tablet by mouth in the morning before breakfast, then take 1/2 tablet by mouth at midday.    Dispense:  30 tablet    Refill:  0    E66.01 dx code    Return in about 4 weeks (around 12/29/2021) for F/U, Weight loss, Jaleeah Slight PCP.   Total time spent:30 Minutes Time spent includes review of chart, medications, test results, and follow up plan with the patient.   [place] Controlled Substance Database was reviewed by me.  This patient was seen by Sallyanne Kuster, FNP-C in collaboration with Dr. Beverely Risen as a part of collaborative care agreement.   Jaylenn Baiza R. Tedd Sias, MSN, FNP-C Internal medicine

## 2021-12-12 ENCOUNTER — Encounter: Payer: Self-pay | Admitting: Nurse Practitioner

## 2021-12-30 ENCOUNTER — Encounter: Payer: Self-pay | Admitting: Nurse Practitioner

## 2021-12-30 ENCOUNTER — Ambulatory Visit: Payer: Medicaid Other | Admitting: Nurse Practitioner

## 2021-12-30 VITALS — BP 123/84 | HR 98 | Temp 96.4°F | Resp 16 | Ht 65.0 in | Wt 196.4 lb

## 2021-12-30 DIAGNOSIS — Z23 Encounter for immunization: Secondary | ICD-10-CM | POA: Diagnosis not present

## 2021-12-30 DIAGNOSIS — H9212 Otorrhea, left ear: Secondary | ICD-10-CM | POA: Diagnosis not present

## 2021-12-30 DIAGNOSIS — B372 Candidiasis of skin and nail: Secondary | ICD-10-CM

## 2021-12-30 DIAGNOSIS — M1710 Unilateral primary osteoarthritis, unspecified knee: Secondary | ICD-10-CM

## 2021-12-30 DIAGNOSIS — H9202 Otalgia, left ear: Secondary | ICD-10-CM | POA: Diagnosis not present

## 2021-12-30 DIAGNOSIS — Z6832 Body mass index (BMI) 32.0-32.9, adult: Secondary | ICD-10-CM

## 2021-12-30 DIAGNOSIS — E661 Drug-induced obesity: Secondary | ICD-10-CM | POA: Diagnosis not present

## 2021-12-30 DIAGNOSIS — Z760 Encounter for issue of repeat prescription: Secondary | ICD-10-CM | POA: Diagnosis not present

## 2021-12-30 MED ORDER — NYSTATIN 100000 UNIT/GM EX POWD: Freq: Four times a day (QID) | CUTANEOUS | 2 refills | Status: DC

## 2021-12-30 MED ORDER — PHENTERMINE HCL 37.5 MG PO TABS: ORAL_TABLET | ORAL | 0 refills | Status: DC

## 2021-12-30 MED ORDER — ACETAMINOPHEN-CODEINE 300-30 MG PO TABS: 1.0000 | ORAL_TABLET | Freq: Every evening | ORAL | 0 refills | Status: DC | PRN

## 2021-12-30 NOTE — Progress Notes (Signed)
[facility], [place] [address]  Internal MEDICINE  Office Visit Note  Patient Name: Whitney Brandt  [id number]  [id number]  Date of Service: 12/30/2021  Chief Complaint  Patient presents with   Weight Loss    Follow up    HPI Whitney Brandt presents for follow-up visit for weight loss management, medication refills, ear pain and flu shot. Weight loss management --patient lost approximately 6 ounces.  No significant weight loss but no weight gain either, she wants to continue to take weight loss medication to help her lose weight.  Also discussed her diet and that she needs to decrease the amount of sugary cereals that she is eating, other snacks and juices or other drinks with calories. Left Ear pressure/pain and drainage--her left ear is still bothering her, she typically goes to ENT to get them washed out, she does have an audiologist but usually sees Dr. Tami Ribas for ear wash but needs referral Requesting flu shot Needs medication refills    Current Medication: Outpatient Encounter Medications as of 12/30/2021  Medication Sig   fluticasone (FLONASE) 50 MCG/ACT nasal spray Place 2 sprays into both nostrils daily as needed (allergies.).   hydrochlorothiazide (HYDRODIURIL) 12.5 MG tablet Take 1 tablet (12.5 mg total) by mouth daily.   ibuprofen (ADVIL) 800 MG tablet TAKE 1 TABLET (800 MG TOTAL) BY MOUTH 2 (TWO) TIMES DAILY AS NEEDED FOR MODERATE PAIN.   levocetirizine (XYZAL) 5 MG tablet Take 1 tab po day for allergic rhinitis   mupirocin ointment (BACTROBAN) 2 % Apply to affected areas twice daily as needed   nystatin ointment (MYCOSTATIN) Apply 1 application topically 3 (three) times daily. (Patient taking differently: Apply 1 application  topically 3 (three) times daily as needed (sores under stomach).)   [DISCONTINUED] acetaminophen-codeine (TYLENOL/CODEINE #3) 300-30 MG tablet Take 1 tablet by mouth at bedtime as needed.   [DISCONTINUED] nystatin  (MYCOSTATIN/NYSTOP) powder Apply topically 4 (four) times daily.   [DISCONTINUED] phentermine (ADIPEX-P) 37.5 MG tablet Take 1/2 tablet by mouth in the morning before breakfast, then take 1/2 tablet by mouth at midday.   acetaminophen-codeine (TYLENOL/CODEINE #3) 300-30 MG tablet Take 1 tablet by mouth at bedtime as needed.   nystatin (MYCOSTATIN/NYSTOP) powder Apply topically 4 (four) times daily.   phentermine (ADIPEX-P) 37.5 MG tablet Take 1/2 tablet by mouth in the morning before breakfast, then take 1/2 tablet by mouth at midday.   No facility-administered encounter medications on file as of 12/30/2021.    Surgical History: Past Surgical History:  Procedure Laterality Date   ABDOMINAL HYSTERECTOMY     PANNICULECTOMY N/A 07/09/2019   Procedure: PANNICULECTOMY;  Surgeon: Cindra Presume, MD;  Location: [facility];  Service: Plastics;  Laterality: N/A;  3 hours    Medical History: Past Medical History:  Diagnosis Date   Diabetes mellitus without complication ([place])     " she has not had diabetes in 5 years since she lost weight. " per sister, Whitney Brandt, [place]   GERD (gastroesophageal reflux disease)    Intellectual disability    Panniculitis    Wears glasses     Family History: Family History  Problem Relation Age of Onset   Diabetes Mother    Hypertension Mother    Thyroid disease Mother     Social History   Socioeconomic History   Marital status: Single    Spouse name: Not on file   Number of children: Not on file   Years of education: Not on file  Highest education level: Not on file  Occupational History   Not on file  Tobacco Use   Smoking status: Never   Smokeless tobacco: Never  Vaping Use   Vaping Use: Never used  Substance and Sexual Activity   Alcohol use: No   Drug use: No   Sexual activity: Never  Other Topics Concern   Not on file  Social History Narrative   Not on file   Social Determinants of Health   Financial Resource Strain: Not on file  Food  Insecurity: Not on file  Transportation Needs: Not on file  Physical Activity: Not on file  Stress: Not on file  Social Connections: Not on file  Intimate Partner Violence: Not on file      Review of Systems  Constitutional:  Negative for chills, fatigue and unexpected weight change.  HENT:  Positive for ear discharge (Left) and ear pain (Left). Negative for congestion, rhinorrhea, sneezing and sore throat.   Eyes:  Negative for redness.  Respiratory:  Negative for cough, chest tightness and shortness of breath.   Cardiovascular:  Negative for chest pain and palpitations.  Gastrointestinal:  Negative for abdominal pain, constipation, diarrhea, nausea and vomiting.  Genitourinary:  Negative for dysuria and frequency.  Musculoskeletal:  Negative for arthralgias, back pain, joint swelling and neck pain.  Skin:  Negative for rash.  Neurological: Negative.  Negative for tremors and numbness.  Hematological:  Negative for adenopathy. Does not bruise/bleed easily.  Psychiatric/Behavioral:  Negative for behavioral problems (Depression), sleep disturbance and suicidal ideas. The patient is not nervous/anxious.     Vital Signs: BP 123/84   Pulse 98 Comment: 108  Temp (!) 96.4 F (35.8 C)   Resp 16   Ht 5\' 5"  (1.651 m)   Wt 196 lb 6.4 oz (89.1 kg)   SpO2 96%   BMI 32.68 kg/m    Physical Exam Vitals reviewed.  Constitutional:      General: [name] not in acute distress.    Appearance: Normal appearance. [name] well-developed. [name] obese. [name] not diaphoretic.  HENT:     Head: Normocephalic and atraumatic.     Left Ear: Drainage, swelling and tenderness present.  Eyes:     Pupils: Pupils are equal, round, and reactive to light.  Neck:     Thyroid: No thyromegaly.     Vascular: No JVD.     Trachea: No tracheal deviation.  Cardiovascular:     Rate and Rhythm: Normal rate and regular rhythm.     Heart sounds: Normal heart sounds. No murmur heard.    No friction rub. No  gallop.  Pulmonary:     Effort: Pulmonary effort is normal. No respiratory distress.     Breath sounds: Normal breath sounds. No wheezing or rales.  Chest:     Chest wall: No tenderness.  Neurological:     Mental Status: [name] alert and oriented to person, place, and time.     Cranial Nerves: No cranial nerve deficit.  Psychiatric:        Mood and Affect: Mood normal.        Behavior: Behavior normal.        Thought Content: Thought content normal.        Judgment: Judgment normal.        Assessment/Plan: 1. Otorrhea of left ear Left ear drainage not significantly improved, refer to ENT, Dr. , existing patient relationship - Ambulatory referral to ENT  2. Left ear pain Continued  left ear pain, not significantly better, refer to ENT, Dr. Jenne Campus as he is familiar with patient - Ambulatory referral to ENT  3. 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  Dispense: 30 tablet; Refill: 0  4. Medication refill - acetaminophen-codeine (TYLENOL/CODEINE #3) 300-30 MG tablet; Take 1 tablet by mouth at bedtime as needed.  Dispense: 90 tablet; Refill: 0 - nystatin (MYCOSTATIN/NYSTOP) powder; Apply topically 4 (four) times daily.  Dispense: 60 g; Refill: 2  5. Needs flu shot - Flu Vaccine MDCK QUAD PF   General Counseling: Donis verbalizes understanding of the findings of todays visit and agrees with plan of treatment. I have discussed any further diagnostic evaluation that may be needed or ordered today. We also reviewed her medications today. she has been encouraged to call the office with any questions or concerns that should arise related to todays visit.    Orders Placed This Encounter  Procedures   Flu  Vaccine MDCK QUAD PF   Ambulatory referral to ENT    Meds ordered this encounter  Medications   acetaminophen-codeine (TYLENOL/CODEINE #3) 300-30 MG tablet    Sig: Take 1 tablet by mouth at bedtime as needed.    Dispense:  90 tablet    Refill:  0    This is not for acute pain. 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   phentermine (ADIPEX-P) 37.5 MG tablet    Sig: Take 1/2 tablet by mouth in the morning before breakfast, then take 1/2 tablet by mouth at midday.    Dispense:  30 tablet    Refill:  0    E66.01 dx code   nystatin (MYCOSTATIN/NYSTOP) powder    Sig: Apply topically 4 (four) times daily.    Dispense:  60 g    Refill:  2    Return in about 4 weeks (around 01/27/2022) for F/U, Weight loss, Dhalia Zingaro PCP.   Total time spent:30 Minutes Time spent includes review of chart, medications, test results, and follow up plan with the patient.   [place] Controlled Substance Database was reviewed by me.  This patient was seen by Sallyanne Kuster, FNP-C in collaboration with Dr. Beverely Risen as a part of collaborative care agreement.   Cristen Murcia R. Tedd Sias, MSN, FNP-C Internal medicine

## 2022-01-02 ENCOUNTER — Encounter: Payer: Self-pay | Admitting: Nurse Practitioner

## 2022-01-06 ENCOUNTER — Telehealth: Payer: Self-pay | Admitting: Nurse Practitioner

## 2022-01-06 NOTE — Telephone Encounter (Signed)
Otolaryngology referral sent via [facility] to [facility] 

## 2022-01-07 ENCOUNTER — Telehealth: Payer: Self-pay | Admitting: Nurse Practitioner

## 2022-01-07 NOTE — Telephone Encounter (Signed)
Otolaryngology appointment 01/26/22 with [facility]

## 2022-01-31 ENCOUNTER — Encounter: Payer: Self-pay | Admitting: Nurse Practitioner

## 2022-01-31 ENCOUNTER — Ambulatory Visit: Payer: Medicaid Other | Admitting: Nurse Practitioner

## 2022-01-31 VITALS — BP 124/79 | HR 99 | Temp 96.7°F | Resp 16 | Ht 65.0 in | Wt 191.8 lb

## 2022-01-31 DIAGNOSIS — Z6832 Body mass index (BMI) 32.0-32.9, adult: Secondary | ICD-10-CM | POA: Diagnosis not present

## 2022-01-31 DIAGNOSIS — R7301 Impaired fasting glucose: Secondary | ICD-10-CM | POA: Diagnosis not present

## 2022-01-31 DIAGNOSIS — E66811 Obesity, class 1: Secondary | ICD-10-CM

## 2022-01-31 DIAGNOSIS — E661 Drug-induced obesity: Secondary | ICD-10-CM | POA: Diagnosis not present

## 2022-01-31 DIAGNOSIS — R635 Abnormal weight gain: Secondary | ICD-10-CM | POA: Diagnosis not present

## 2022-01-31 LAB — POCT GLYCOSYLATED HEMOGLOBIN (HGB A1C): Hemoglobin A1C: 5.1 % (ref 4.0–5.6)

## 2022-01-31 MED ORDER — PHENTERMINE HCL 37.5 MG PO TABS: ORAL_TABLET | ORAL | 0 refills | Status: DC

## 2022-01-31 NOTE — Progress Notes (Signed)
[facility] [address] [place], [place] [address]  Internal MEDICINE  Office Visit Note  Patient Name: Whitney Brandt  [id number]  [id number]  Date of Service: 01/31/2022  Chief Complaint  Patient presents with   Follow-up   Diabetes   Gastroesophageal Reflux    HPI Whitney Brandt presents for a follow up  visit for weight loss management --A1c is normal, 5.1 --Lost 5 lbs since last visit --Keeping with portion sizes and not overeating.  --Walking a lot more and dancing.   Current Medication: Outpatient Encounter Medications as of 01/31/2022  Medication Sig   acetaminophen-codeine (TYLENOL/CODEINE #3) 300-30 MG tablet Take 1 tablet by mouth at bedtime as needed.   fluticasone (FLONASE) 50 MCG/ACT nasal spray Place 2 sprays into both nostrils daily as needed (allergies.).   hydrochlorothiazide (HYDRODIURIL) 12.5 MG tablet Take 1 tablet (12.5 mg total) by mouth daily.   ibuprofen (ADVIL) 800 MG tablet TAKE 1 TABLET (800 MG TOTAL) BY MOUTH 2 (TWO) TIMES DAILY AS NEEDED FOR MODERATE PAIN.   levocetirizine (XYZAL) 5 MG tablet Take 1 tab po day for allergic rhinitis   mupirocin ointment (BACTROBAN) 2 % Apply to affected areas twice daily as needed   nystatin (MYCOSTATIN/NYSTOP) powder Apply topically 4 (four) times daily.   nystatin ointment (MYCOSTATIN) Apply 1 application topically 3 (three) times daily. (Patient taking differently: Apply 1 application  topically 3 (three) times daily as needed (sores under stomach).)   [DISCONTINUED] phentermine (ADIPEX-P) 37.5 MG tablet Take 1/2 tablet by mouth in the morning before breakfast, then take 1/2 tablet by mouth at midday.   phentermine (ADIPEX-P) 37.5 MG tablet Take 1/2 tablet by mouth in the morning before breakfast, then take 1/2 tablet by mouth at midday.   No facility-administered encounter medications on file as of 01/31/2022.    Surgical History: Past Surgical History:  Procedure Laterality Date   ABDOMINAL  HYSTERECTOMY     PANNICULECTOMY N/A 07/09/2019   Procedure: PANNICULECTOMY;  Surgeon: Allena Napoleon, MD;  Location: [facility] OR;  Service: Plastics;  Laterality: N/A;  3 hours    Medical History: Past Medical History:  Diagnosis Date   Diabetes mellitus without complication (HCC)     " she has not had diabetes in 5 years since she lost weight. " per sister, Whitney Brandt, [place]   GERD (gastroesophageal reflux disease)    Intellectual disability    Panniculitis    Wears glasses     Family History: Family History  Problem Relation Age of Onset   Diabetes Mother    Hypertension Mother    Thyroid disease Mother     Social History   Socioeconomic History   Marital status: Single    Spouse name: Not on file   Number of children: Not on file   Years of education: Not on file   Highest education level: Not on file  Occupational History   Not on file  Tobacco Use   Smoking status: Never   Smokeless tobacco: Never  Vaping Use   Vaping Use: Never used  Substance and Sexual Activity   Alcohol use: No   Drug use: No   Sexual activity: Never  Other Topics Concern   Not on file  Social History Narrative   Not on file   Social Determinants of Health   Financial Resource Strain: Not on file  Food Insecurity: Not on file  Transportation Needs: Not on file  Physical Activity: Not on file  Stress: Not on file  Social  Connections: Not on file  Intimate Partner Violence: Not on file      Review of Systems  Constitutional: Negative.  Negative for chills, fatigue and unexpected weight change.  HENT:  Negative for congestion, rhinorrhea, sneezing and sore throat.   Eyes:  Negative for redness.  Respiratory: Negative.  Negative for cough, chest tightness and shortness of breath.   Cardiovascular: Negative.  Negative for chest pain and palpitations.  Gastrointestinal:  Negative for abdominal pain, constipation, diarrhea, nausea and vomiting.  Genitourinary:  Negative for dysuria and  frequency.  Musculoskeletal:  Negative for arthralgias, back pain, joint swelling and neck pain.  Skin:  Negative for rash.  Neurological: Negative.  Negative for tremors and numbness.  Hematological:  Negative for adenopathy. Does not bruise/bleed easily.  Psychiatric/Behavioral:  Negative for behavioral problems (Depression), sleep disturbance and suicidal ideas. The patient is not nervous/anxious.     Vital Signs: BP 124/79   Pulse 99   Temp (!) 96.7 F (35.9 C)   Resp 16   Ht 5\' 5"  (1.651 m)   Wt 191 lb 12.8 oz (87 kg)   SpO2 98%   BMI 31.92 kg/m    Physical Exam Vitals reviewed.  Constitutional:      General: [name] not in acute distress.    Appearance: Normal appearance. [name] well-developed. [name] obese. [name] not diaphoretic.  HENT:     Head: Normocephalic and atraumatic.  Eyes:     Pupils: Pupils are equal, round, and reactive to light.  Neck:     Thyroid: No thyromegaly.     Vascular: No JVD.     Trachea: No tracheal deviation.  Cardiovascular:     Rate and Rhythm: Normal rate and regular rhythm.     Heart sounds: Normal heart sounds. No murmur heard.    No friction rub. No gallop.  Pulmonary:     Effort: Pulmonary effort is normal. No respiratory distress.     Breath sounds: Normal breath sounds. No wheezing or rales.  Chest:     Chest wall: No tenderness.  Neurological:     Mental Status: [name] alert and oriented to person, place, and time.     Cranial Nerves: No cranial nerve deficit.  Psychiatric:        Mood and Affect: Mood normal.        Behavior: Behavior normal.        Thought Content: Thought content normal.        Judgment: Judgment normal.        Assessment/Plan: 1. Impaired fasting glucose Normal A1c, repeat in 6-12 months. - POCT glycosylated hemoglobin (Hb A1C)  2. Abnormal weight gain Lost 5 more lbs, continue phentermine for another month along with diet and lifestyle modifications as discussed.   3. Class 1 drug-induced  obesity without serious comorbidity with body mass index (BMI) of 32.0 to 32.9 in adult Lost 5 more lbs, continue phentermine as prescribed.    General Counseling: [name] verbalizes understanding of the findings of todays visit and agrees with plan of treatment. I have discussed any further diagnostic evaluation that may be needed or ordered today. We also reviewed her medications today. she has been encouraged to call the office with any questions or concerns that should arise related to todays visit.    Orders Placed This Encounter  Procedures   POCT glycosylated hemoglobin (Hb A1C)    Meds ordered this encounter  Medications   phentermine (ADIPEX-P) 37.5 MG tablet  Sig: Take 1/2 tablet by mouth in the morning before breakfast, then take 1/2 tablet by mouth at midday.    Dispense:  30 tablet    Refill:  0    E66.01 dx code    Return in about 4 weeks (around 02/28/2022) for F/U, Weight loss, Khamarion Bjelland PCP.   Total time spent:30 Minutes Time spent includes review of chart, medications, test results, and follow up plan with the patient.   [place] Controlled Substance Database was reviewed by me.  This patient was seen by Sallyanne Kuster, FNP-C in collaboration with Dr. Beverely Risen as a part of collaborative care agreement.   Miana Politte R. Tedd Sias, MSN, FNP-C Internal medicine

## 2022-02-18 ENCOUNTER — Other Ambulatory Visit: Payer: Self-pay

## 2022-02-18 MED ORDER — CIPROFLOXACIN-DEXAMETHASONE 0.3-0.1 % OT SUSP
OTIC | 0 refills | Status: DC
  Filled 2022-02-18: qty 7.5, 5d supply, fill #0

## 2022-02-22 ENCOUNTER — Other Ambulatory Visit: Payer: Self-pay

## 2022-02-22 NOTE — Discharge Instructions (Signed)
[facility] DISCHARGE INSTRUCTIONS FOR MYRINGOTOMY AND TUBE INSERTION  [facility] CHAPMAN T. MCQUEEN, M.D.   Diet:   After surgery, the patient should take only liquids and foods as tolerated.  The patient may then have a regular diet after the effects of anesthesia have worn off, usually about four to six hours after surgery.  Activities:   The patient should rest until the effects of anesthesia have worn off.  After this, there are no restrictions on the normal daily activities.  Medications:   You will be given a prescription for antibiotic drops to be used in the ears postoperatively.  It is recommended to use 4 drops 2 times a day for 7 days, then the drops should be saved for possible future use.  The tubes should not cause any discomfort to the patient, but if there is any question, Tylenol should be given according to the instructions for the age of the patient.  Other medications should be continued normally.  Precautions:   Should there be recurrent drainage after the tubes are placed, the drops should be used for approximately 3-4 days.  If it does not clear, you should call the ENT office.  Earplugs:   Earplugs are only needed for those who are going to be submerged under water.  When taking a bath or shower and using a cup or showerhead to rinse hair, it is not necessary to wear earplugs.  These come in a variety of fashions, all of which can be obtained at our office.  However, if one is not able to come by the office, then silicone plugs can be found at most pharmacies.  It is not advised to stick anything in the ear that is not approved as an earplug.  Silly putty is not to be used as an earplug.  Swimming is allowed in patients after ear tubes are inserted, however, they must wear earplugs if they are going to be submerged under water.  For those children who are going to be swimming a lot, it is recommended to use a fitted ear mold, which can be  made by our audiologist.  If discharge is noticed from the ears, this most likely represents an ear infection.  We would recommend getting your eardrops and using them as indicated above.  If it does not clear, then you should call the ENT office.  For follow up, the patient should return to the ENT office three weeks postoperatively and then every six months as required by the doctor. 

## 2022-02-23 ENCOUNTER — Encounter: Payer: Self-pay | Admitting: Unknown Physician Specialty

## 2022-02-28 ENCOUNTER — Ambulatory Visit: Payer: Medicaid Other | Admitting: Nurse Practitioner

## 2022-02-28 ENCOUNTER — Encounter: Payer: Self-pay | Admitting: Nurse Practitioner

## 2022-02-28 VITALS — BP 132/90 | HR 122 | Temp 96.4°F | Resp 16 | Ht 65.0 in | Wt 185.6 lb

## 2022-02-28 DIAGNOSIS — H9212 Otorrhea, left ear: Secondary | ICD-10-CM | POA: Diagnosis not present

## 2022-02-28 DIAGNOSIS — R635 Abnormal weight gain: Secondary | ICD-10-CM

## 2022-02-28 DIAGNOSIS — Z6832 Body mass index (BMI) 32.0-32.9, adult: Secondary | ICD-10-CM | POA: Diagnosis not present

## 2022-02-28 DIAGNOSIS — E661 Drug-induced obesity: Secondary | ICD-10-CM

## 2022-02-28 MED ORDER — PHENTERMINE HCL 37.5 MG PO TABS: ORAL_TABLET | ORAL | 0 refills | Status: DC

## 2022-02-28 NOTE — Progress Notes (Signed)
[facility] [address] [place], [place] [address]  Internal MEDICINE  Office Visit Note  Patient Name: Whitney Brandt  [id number]  [id number]  Date of Service: 02/28/2022  Chief Complaint  Patient presents with  . Follow-up    Weight loss    HPI Whitney Brandt presents for a follow up visit for weight loss management and impaired fasting glucose.  She has been Exercising and walking [name] Portion sizing her cereal Has Lost 6 lbs. Since last office visit    Current Medication: Outpatient Encounter Medications as of 02/28/2022  Medication Sig  . acetaminophen-codeine (TYLENOL/CODEINE #3) 300-30 MG tablet Take 1 tablet by mouth at bedtime as needed.  . ciprofloxacin-dexamethasone (CIPRODEX) OTIC suspension Place 4 drops in each ear two times daily for 5 days (DOS 03/03/22)  . fluticasone (FLONASE) 50 MCG/ACT nasal spray Place 2 sprays into both nostrils daily as needed (allergies.).  Marland Kitchen hydrochlorothiazide (HYDRODIURIL) 12.5 MG tablet Take 1 tablet (12.5 mg total) by mouth daily.  Marland Kitchen ibuprofen (ADVIL) 800 MG tablet TAKE 1 TABLET (800 MG TOTAL) BY MOUTH 2 (TWO) TIMES DAILY AS NEEDED FOR MODERATE PAIN.  Marland Kitchen levocetirizine (XYZAL) 5 MG tablet Take 1 tab po day for allergic rhinitis  . mupirocin ointment (BACTROBAN) 2 % Apply to affected areas twice daily as needed  . nystatin (MYCOSTATIN/NYSTOP) powder Apply topically 4 (four) times daily.  Marland Kitchen nystatin ointment (MYCOSTATIN) Apply 1 application topically 3 (three) times daily. (Patient taking differently: Apply 1 application  topically 3 (three) times daily as needed (sores under stomach).)  . [DISCONTINUED] phentermine (ADIPEX-P) 37.5 MG tablet Take 1/2 tablet by mouth in the morning before breakfast, then take 1/2 tablet by mouth at midday.  . phentermine (ADIPEX-P) 37.5 MG tablet Take 1/2 tablet by mouth in the morning before breakfast, then take 1/2 tablet by mouth at midday.   No facility-administered encounter  medications on file as of 02/28/2022.    Surgical History: Past Surgical History:  Procedure Laterality Date  . ABDOMINAL HYSTERECTOMY    . PANNICULECTOMY N/A 07/09/2019   Procedure: PANNICULECTOMY;  Surgeon: Allena Napoleon, MD;  Location: [facility] OR;  Service: Plastics;  Laterality: N/A;  3 hours    Medical History: Past Medical History:  Diagnosis Date  . Diabetes mellitus without complication (HCC)     " she has not had diabetes in 5 years since she lost weight. " per sister, Whitney Brandt, [place]  . GERD (gastroesophageal reflux disease)   . Intellectual disability   . Panniculitis   . Wears glasses     Family History: Family History  Problem Relation Age of Onset  . Diabetes Mother   . Hypertension Mother   . Thyroid disease Mother     Social History   Socioeconomic History  . Marital status: Single    Spouse name: Not on file  . Number of children: Not on file  . Years of education: Not on file  . Highest education level: Not on file  Occupational History  . Not on file  Tobacco Use  . Smoking status: Never  . Smokeless tobacco: Never  Vaping Use  . Vaping Use: Never used  Substance and Sexual Activity  . Alcohol use: No  . Drug use: No  . Sexual activity: Never  Other Topics Concern  . Not on file  Social History Narrative  . Not on file   Social Determinants of Health   Financial Resource Strain: Not on file  Food Insecurity: Not on file  Transportation Needs: Not on file  Physical Activity: Not on file  Stress: Not on file  Social Connections: Not on file  Intimate Partner Violence: Not on file      Review of Systems  Vital Signs: BP (!) 132/90   Pulse (!) 122   Temp (!) 96.4 F (35.8 C)   Resp 16   Ht 5\' 5"  (1.651 m)   Wt 185 lb 9.6 oz (84.2 kg)   SpO2 98%   BMI 30.89 kg/m    Physical Exam     Assessment/Plan:   General Counseling: Whitney Brandt verbalizes understanding of the findings of todays visit and agrees with plan of treatment. I  have discussed any further diagnostic evaluation that may be needed or ordered today. We also reviewed her medications today. she has been encouraged to call the office with any questions or concerns that should arise related to todays visit.    No orders of the defined types were placed in this encounter.   Meds ordered this encounter  Medications  . phentermine (ADIPEX-P) 37.5 MG tablet    Sig: Take 1/2 tablet by mouth in the morning before breakfast, then take 1/2 tablet by mouth at midday.    Dispense:  30 tablet    Refill:  0    E66.01 dx code    Return in about 4 weeks (around 03/28/2022) for F/U, Weight loss, Whitney Brandt PCP.   Total time spent:*** Minutes Time spent includes review of chart, medications, test results, and follow up plan with the patient.   [place] Controlled Substance Database was reviewed by me.  This patient was seen by 03/30/2022, FNP-C in collaboration with Dr. Sallyanne Kuster as a part of collaborative care agreement.   Ainsley Deakins R. Beverely Risen, MSN, FNP-C Internal medicine

## 2022-03-01 ENCOUNTER — Encounter: Payer: Self-pay | Admitting: Nurse Practitioner

## 2022-03-03 ENCOUNTER — Ambulatory Visit: Payer: Medicaid Other | Admitting: Anesthesiology

## 2022-03-03 ENCOUNTER — Encounter
Admission: RE | Disposition: A | Discharge status: Home / Self Care | Payer: Self-pay | Source: Home / Self Care | Attending: Unknown Physician Specialty

## 2022-03-03 ENCOUNTER — Other Ambulatory Visit: Payer: Self-pay

## 2022-03-03 ENCOUNTER — Encounter: Payer: Self-pay | Admitting: Unknown Physician Specialty

## 2022-03-03 ENCOUNTER — Ambulatory Visit
Admission: RE | Admit: 2022-03-03 | Discharge: 2022-03-03 | Disposition: A | Discharge status: Home / Self Care | Payer: Medicaid Other | Attending: Unknown Physician Specialty | Admitting: Unknown Physician Specialty

## 2022-03-03 DIAGNOSIS — Z79899 Other long term (current) drug therapy: Secondary | ICD-10-CM | POA: Insufficient documentation

## 2022-03-03 DIAGNOSIS — H6123 Impacted cerumen, bilateral: Secondary | ICD-10-CM | POA: Diagnosis not present

## 2022-03-03 DIAGNOSIS — F79 Unspecified intellectual disabilities: Secondary | ICD-10-CM | POA: Insufficient documentation

## 2022-03-03 DIAGNOSIS — Z01818 Encounter for other preprocedural examination: Secondary | ICD-10-CM

## 2022-03-03 DIAGNOSIS — Z9622 Myringotomy tube(s) status: Secondary | ICD-10-CM | POA: Diagnosis not present

## 2022-03-03 DIAGNOSIS — I10 Essential (primary) hypertension: Secondary | ICD-10-CM | POA: Diagnosis not present

## 2022-03-03 HISTORY — PX: CERUMEN REMOVAL: SHX6571

## 2022-03-03 HISTORY — PX: REMOVAL OF EAR TUBE: SHX6057

## 2022-03-03 SURGERY — EXAM UNDER ANESTHESIA
Anesthesia: General | Site: Ear | Laterality: Bilateral

## 2022-03-03 MED ORDER — PROPOFOL 10 MG/ML IV BOLUS
INTRAVENOUS | Status: DC | PRN
  Administered 2022-03-03: 100 mg via INTRAVENOUS

## 2022-03-03 MED ORDER — MIDAZOLAM HCL 2 MG/2ML IJ SOLN
INTRAMUSCULAR | Status: DC | PRN
  Administered 2022-03-03: 2 mg via INTRAVENOUS

## 2022-03-03 MED ORDER — LACTATED RINGERS IV SOLN: INTRAVENOUS | Status: DC

## 2022-03-03 MED ORDER — OXYCODONE HCL 5 MG/5ML PO SOLN: 5.0000 mg | Freq: Once | ORAL | Status: DC | PRN

## 2022-03-03 MED ORDER — DEXMEDETOMIDINE HCL IN NACL 80 MCG/20ML IV SOLN
INTRAVENOUS | Status: DC | PRN
  Administered 2022-03-03: 8 ug via BUCCAL

## 2022-03-03 MED ORDER — OXYCODONE HCL 5 MG PO TABS: 5.0000 mg | ORAL_TABLET | Freq: Once | ORAL | Status: DC | PRN

## 2022-03-03 MED ORDER — LIDOCAINE HCL (CARDIAC) PF 100 MG/5ML IV SOSY
PREFILLED_SYRINGE | INTRAVENOUS | Status: DC | PRN
  Administered 2022-03-03: 30 mg via INTRAVENOUS

## 2022-03-03 MED ORDER — CIPROFLOXACIN-DEXAMETHASONE 0.3-0.1 % OT SUSP
OTIC | Status: DC | PRN
  Administered 2022-03-03: 1 [drp]

## 2022-03-03 MED ORDER — FENTANYL CITRATE PF 50 MCG/ML IJ SOSY: 25.0000 ug | PREFILLED_SYRINGE | INTRAMUSCULAR | Status: DC | PRN

## 2022-03-03 SURGICAL SUPPLY — 9 items
BALL CTTN LRG ABS STRL LF (GAUZE/BANDAGES/DRESSINGS) ×2
CANISTER SUCT 1200ML W/VALVE (MISCELLANEOUS) ×2 IMPLANT
COTTONBALL LRG STERILE PKG (GAUZE/BANDAGES/DRESSINGS) ×2 IMPLANT
GLOVE SURG ENC TEXT LTX SZ7.5 (GLOVE) ×2 IMPLANT
KIT TURNOVER KIT A (KITS) ×2 IMPLANT
STRAP BODY AND KNEE 60X3 (MISCELLANEOUS) ×2 IMPLANT
TOWEL OR 17X26 4PK STRL BLUE (TOWEL DISPOSABLE) ×2 IMPLANT
TUBING CONN 6MMX3.1M (TUBING) ×2
TUBING SUCTION CONN 0.25 STRL (TUBING) ×2 IMPLANT

## 2022-03-03 NOTE — H&P (Signed)
The patient's history has been reviewed, patient examined, no change in status, stable for surgery.  Questions were answered to the patients satisfaction.  

## 2022-03-03 NOTE — Op Note (Signed)
03/03/2022  11:39 AM    Whitney Brandt  [id number]   Pre-Op Dx: Cerumen, Impacted  Post-op Dx: SAME  Proc: Exam under anesthesia removal of cerumen bilaterally, removal of butterfly tube on the left  Surg:  Davina Poke  Anes:  GOT  EBL: 0  Comp: None  Findings: Cerumen impaction on the right approximately with 40% perforation of the pars tensa anteriorly clean and dry.  Left ear old tube with pus and granulation tissue around it removed as well as cerumen impaction  Procedure: Whitney Brandt was identified in the holding area taken the operating placed in supine position.  After general mask anesthesia the operating microscope was brought into the field.  Examination of the right ear showed a large cerumen impaction which was removed using alligator and the wax curette.  This removed there was a perforation anteriorly approximately 40% which was clean and dry.  The left ear was then examined again and there was a large cerumen impaction which was removed using the alligator forceps and the curette.  This wax impaction removed the old butterfly tube was identified there was significant purulence and granulation tissue around the tube.  It was removed remained approximately 10% perforation of the pars tensa anteriorly.  Did not feel the new tube needed to be placed at this point therefore Ciprodex drops were instilled in the canal followed by cottonball.  The patient was then returned to anesthesia where she was awakened in the operating room to the cover in stable condition.  Dispo:   Good  Plan: Discharged home Ciprodex drops to use follow-up 6 weeks  Davina Poke  03/03/2022 11:39 AM

## 2022-03-03 NOTE — Anesthesia Preprocedure Evaluation (Signed)
Anesthesia Evaluation  Patient identified by MRN, date of birth, ID band Patient awake    Reviewed: Allergy & Precautions, NPO status , Patient's Chart, lab work & pertinent test results  History of Anesthesia Complications Negative for: history of anesthetic complications  Airway Mallampati: II  TM Distance: >3 FB Neck ROM: full    Dental  (+) Chipped   Pulmonary neg pulmonary ROS, neg COPD   Pulmonary exam normal        Cardiovascular hypertension, On Medications Normal cardiovascular exam     Neuro/Psych negative neurological ROS  negative psych ROS   GI/Hepatic negative GI ROS, Neg liver ROS,,,  Endo/Other  negative endocrine ROSdiabetes    Renal/GU negative Renal ROS  negative genitourinary   Musculoskeletal   Abdominal   Peds  Hematology negative hematology ROS (+)   Anesthesia Other Findings Past Medical History: No date: Diabetes mellitus without complication (HCC)     Comment:   " she has not had diabetes in 5 years since she lost               weight. " per sister, [name], [place] No date: GERD (gastroesophageal reflux disease) No date: Intellectual disability No date: Panniculitis No date: Wears glasses  Past Surgical History: No date: ABDOMINAL HYSTERECTOMY 07/09/2019: PANNICULECTOMY; N/A     Comment:  Procedure: PANNICULECTOMY;  Surgeon: Allena Napoleon,               MD;  Location: [facility] OR;  Service: Plastics;  Laterality:               N/A;  3 hours  BMI    Body Mass Index: 28.11 kg/m      Reproductive/Obstetrics negative OB ROS                             Anesthesia Physical Anesthesia Plan  ASA: 2  Anesthesia Plan: General   Post-op Pain Management: Minimal or no pain anticipated   Induction: Intravenous  PONV Risk Score and Plan: 3 and Propofol infusion, TIVA and Ondansetron  Airway Management Planned: Nasal Cannula  Additional Equipment:  None  Intra-op Plan:   Post-operative Plan:   Informed Consent: I have reviewed the patients History and Physical, chart, labs and discussed the procedure including the risks, benefits and alternatives for the proposed anesthesia with the patient or authorized representative who has indicated his/her understanding and acceptance.     Dental advisory given  Plan Discussed with: CRNA and Surgeon  Anesthesia Plan Comments: (Discussed risks of anesthesia with patient, including possibility of difficulty with spontaneous ventilation under anesthesia necessitating airway intervention, PONV, and rare risks such as cardiac or respiratory or neurological events, and allergic reactions. Discussed the role of CRNA in patient's perioperative care. Patient understands.)        Anesthesia Quick Evaluation

## 2022-03-03 NOTE — Anesthesia Postprocedure Evaluation (Signed)
Anesthesia Post Note  Patient: Whitney Brandt  Procedure(s) Performed: EXAM UNDER ANESTHESIA (Bilateral: Ear) REMOVAL OF EAR TUBE (Bilateral: Ear) CERUMEN REMOVAL (Bilateral: Ear)  Patient location during evaluation: PACU Anesthesia Type: General Level of consciousness: awake and alert Pain management: pain level controlled Vital Signs Assessment: post-procedure vital signs reviewed and stable Respiratory status: spontaneous breathing, nonlabored ventilation, respiratory function stable and patient connected to nasal cannula oxygen Cardiovascular status: blood pressure returned to baseline and stable Postop Assessment: no apparent nausea or vomiting Anesthetic complications: no  No notable events documented.   Last Vitals:  Vitals:   03/03/22 1145 03/03/22 1200  BP: 119/73 (!) 128/96  Pulse: 75 (!) 116  Resp: (!) 26 15  Temp:  36.6 C  SpO2: 98% 97%    Last Pain:  Vitals:   03/03/22 1200  PainSc: 1                  Stephanie Coup

## 2022-03-03 NOTE — Transfer of Care (Signed)
Immediate Anesthesia Transfer of Care Note  Patient: Whitney Brandt  Procedure(s) Performed: EXAM UNDER ANESTHESIA (Bilateral: Ear) REMOVAL OF EAR TUBE (Bilateral: Ear) CERUMEN REMOVAL (Bilateral: Ear)  Patient Location: PACU  Anesthesia Type: General  Level of Consciousness: awake, alert  and patient cooperative  Airway and Oxygen Therapy: Patient Spontanous Breathing and Patient connected to supplemental oxygen  Post-op Assessment: Post-op Vital signs reviewed, Patient's Cardiovascular Status Stable, Respiratory Function Stable, Patent Airway and No signs of Nausea or vomiting  Post-op Vital Signs: Reviewed and stable  Complications: No notable events documented.

## 2022-03-07 ENCOUNTER — Other Ambulatory Visit: Payer: Self-pay

## 2022-03-30 ENCOUNTER — Encounter: Payer: Self-pay | Admitting: Nurse Practitioner

## 2022-03-30 ENCOUNTER — Other Ambulatory Visit: Payer: Self-pay | Admitting: Nurse Practitioner

## 2022-03-30 ENCOUNTER — Ambulatory Visit (INDEPENDENT_AMBULATORY_CARE_PROVIDER_SITE_OTHER): Payer: Medicaid Other | Admitting: Nurse Practitioner

## 2022-03-30 VITALS — BP 106/87 | HR 105 | Temp 97.7°F | Resp 16 | Ht 65.0 in | Wt 185.8 lb

## 2022-03-30 DIAGNOSIS — R7301 Impaired fasting glucose: Secondary | ICD-10-CM

## 2022-03-30 DIAGNOSIS — R635 Abnormal weight gain: Secondary | ICD-10-CM | POA: Diagnosis not present

## 2022-03-30 DIAGNOSIS — E661 Drug-induced obesity: Secondary | ICD-10-CM

## 2022-03-30 DIAGNOSIS — Z6830 Body mass index (BMI) 30.0-30.9, adult: Secondary | ICD-10-CM

## 2022-03-30 DIAGNOSIS — M1710 Unilateral primary osteoarthritis, unspecified knee: Secondary | ICD-10-CM

## 2022-03-30 MED ORDER — PHENTERMINE HCL 37.5 MG PO TABS: ORAL_TABLET | ORAL | 0 refills | Status: DC

## 2022-03-30 NOTE — Progress Notes (Cosign Needed)
[facility] [place], [place] [address]  Internal MEDICINE  Office Visit Note  Patient Name: Whitney Brandt  [id number]  [id number]  Date of Service: 03/30/2022  Chief Complaint  Patient presents with   Follow-up    Weight loss    Quality Metric Gaps    TDAP    HPI Whitney Brandt presents for a follow up visit for weight loss management, medication refill.  Abnormal weight gain -- on phentermine, no weight gain or loss over the christmas holiday  Impaired fasting glucose -- will improve with weight loss.     Current Medication: Outpatient Encounter Medications as of 03/30/2022  Medication Sig   acetaminophen-codeine (TYLENOL/CODEINE #3) 300-30 MG tablet Take 1 tablet by mouth at bedtime as needed.   ciprofloxacin-dexamethasone (CIPRODEX) OTIC suspension Place 4 drops in each ear two times daily for 5 days (DOS 03/03/22)   fluticasone (FLONASE) 50 MCG/ACT nasal spray Place 2 sprays into both nostrils daily as needed (allergies.).   hydrochlorothiazide (HYDRODIURIL) 12.5 MG tablet Take 1 tablet (12.5 mg total) by mouth daily.   ibuprofen (ADVIL) 800 MG tablet TAKE 1 TABLET (800 MG TOTAL) BY MOUTH 2 (TWO) TIMES DAILY AS NEEDED FOR MODERATE PAIN.   levocetirizine (XYZAL) 5 MG tablet Take 1 tab po day for allergic rhinitis   mupirocin ointment (BACTROBAN) 2 % Apply to affected areas twice daily as needed   nystatin (MYCOSTATIN/NYSTOP) powder Apply topically 4 (four) times daily.   nystatin ointment (MYCOSTATIN) Apply 1 application topically 3 (three) times daily. (Patient taking differently: Apply 1 application  topically 3 (three) times daily as needed (sores under stomach).)   [DISCONTINUED] phentermine (ADIPEX-P) 37.5 MG tablet Take 1/2 tablet by mouth in the morning before breakfast, then take 1/2 tablet by mouth at midday.   phentermine (ADIPEX-P) 37.5 MG tablet Take 1/2 tablet by mouth in the morning before breakfast, then take 1/2 tablet by mouth at  midday.   No facility-administered encounter medications on file as of 03/30/2022.    Surgical History: Past Surgical History:  Procedure Laterality Date   ABDOMINAL HYSTERECTOMY     CERUMEN REMOVAL Bilateral 03/03/2022   Procedure: CERUMEN REMOVAL;  Surgeon: Beverly Gust, MD;  Location: [place];  Service: ENT;  Laterality: Bilateral;   PANNICULECTOMY N/A 07/09/2019   Procedure: PANNICULECTOMY;  Surgeon: Cindra Presume, MD;  Location: [place];  Service: Plastics;  Laterality: N/A;  3 hours   REMOVAL OF EAR TUBE Bilateral 03/03/2022   Procedure: REMOVAL OF EAR TUBE;  Surgeon: Beverly Gust, MD;  Location: [place];  Service: ENT;  Laterality: Bilateral;    Medical History: Past Medical History:  Diagnosis Date   Diabetes mellitus without complication ([place])     " she has not had diabetes in 5 years since she lost weight. " per sister, Whitney Brandt, [place]   GERD (gastroesophageal reflux disease)    Intellectual disability    Panniculitis    Wears glasses     Family History: Family History  Problem Relation Age of Onset   Diabetes Mother    Hypertension Mother    Thyroid disease Mother     Social History   Socioeconomic History   Marital status: Single    Spouse name: Not on file   Number of children: Not on file   Years of education: Not on file   Highest education level: Not on file  Occupational History   Not on file  Tobacco Use   Smoking status: Never  Smokeless tobacco: Never  Vaping Use   Vaping Use: Never used  Substance and Sexual Activity   Alcohol use: No   Drug use: No   Sexual activity: Never  Other Topics Concern   Not on file  Social History Narrative   Not on file   Social Determinants of Health   Financial Resource Strain: Not on file  Food Insecurity: Not on file  Transportation Needs: Not on file  Physical Activity: Not on file  Stress: Not on file  Social Connections: Not on file  Intimate Partner Violence: Not on  file      Review of Systems  Constitutional: Negative.  Negative for chills, fatigue and unexpected weight change.  HENT:  Negative for congestion, rhinorrhea, sneezing and sore throat.   Eyes:  Negative for redness.  Respiratory: Negative.  Negative for cough, chest tightness and shortness of breath.   Cardiovascular: Negative.  Negative for chest pain and palpitations.  Gastrointestinal:  Negative for abdominal pain, constipation, diarrhea, nausea and vomiting.  Genitourinary:  Negative for dysuria and frequency.  Musculoskeletal:  Negative for arthralgias, back pain, joint swelling and neck pain.  Skin:  Negative for rash.  Neurological: Negative.  Negative for tremors and numbness.  Hematological:  Negative for adenopathy. Does not bruise/bleed easily.  Psychiatric/Behavioral:  Negative for behavioral problems (Depression), sleep disturbance and suicidal ideas. The patient is not nervous/anxious.     Vital Signs: BP 106/87   Pulse (!) 105   Temp 97.7 F (36.5 C)   Resp 16   Ht 5\' 5"  (1.651 m)   Wt 185 lb 12.8 oz (84.3 kg)   SpO2 97%   BMI 30.92 kg/m    Physical Exam Vitals reviewed.  Constitutional:      General: [name] not in acute distress.    Appearance: Normal appearance. [name] well-developed. [name] obese. [name] not diaphoretic.  HENT:     Head: Normocephalic and atraumatic.  Eyes:     Pupils: Pupils are equal, round, and reactive to light.  Neck:     Thyroid: No thyromegaly.     Vascular: No JVD.     Trachea: No tracheal deviation.  Cardiovascular:     Rate and Rhythm: Normal rate and regular rhythm.     Heart sounds: Normal heart sounds. No murmur heard.    No friction rub. No gallop.  Pulmonary:     Effort: Pulmonary effort is normal. No respiratory distress.     Breath sounds: Normal breath sounds. No wheezing or rales.  Chest:     Chest wall: No tenderness.  Neurological:     Mental Status: [name] alert and oriented to person, place, and time.      Cranial Nerves: No cranial nerve deficit.  Psychiatric:        Mood and Affect: Mood normal.        Behavior: Behavior normal.        Thought Content: Thought content normal.        Judgment: Judgment normal.        Assessment/Plan: 1. Abnormal weight gain Decreased appetite, no weight gain or loss over christmas holiday. Continue for another month, refill ordered.  - phentermine (ADIPEX-P) 37.5 MG tablet; Take 1/2 tablet by mouth in the morning before breakfast, then take 1/2 tablet by mouth at midday.  Dispense: 30 tablet; Refill: 0  2. Impaired fasting glucose Glucose levels will improve with weight loss.   3. Class 1 drug-induced obesity without serious  comorbidity with body mass index (BMI) of 30.0 to 30.9 in adult Phentermine refilled, follow up in 4 weeks.  - phentermine (ADIPEX-P) 37.5 MG tablet; Take 1/2 tablet by mouth in the morning before breakfast, then take 1/2 tablet by mouth at midday.  Dispense: 30 tablet; Refill: 0    General Counseling: Mennie verbalizes understanding of the findings of todays visit and agrees with plan of treatment. I have discussed any further diagnostic evaluation that may be needed or ordered today. We also reviewed her medications today. she has been encouraged to call the office with any questions or concerns that should arise related to todays visit.    No orders of the defined types were placed in this encounter.   Meds ordered this encounter  Medications   phentermine (ADIPEX-P) 37.5 MG tablet    Sig: Take 1/2 tablet by mouth in the morning before breakfast, then take 1/2 tablet by mouth at midday.    Dispense:  30 tablet    Refill:  0    E66.01 dx code    Return in about 4 weeks (around 04/27/2022) for F/U, Weight loss, Laylia Mui PCP.   Total time spent:30 Minutes Time spent includes review of chart, medications, test results, and follow up plan with the patient.   [place] Controlled Substance Database was reviewed by  me.  This patient was seen by Jonetta Osgood, FNP-C in collaboration with Dr. Clayborn Bigness as a part of collaborative care agreement.   Tacari Repass R. Valetta Fuller, MSN, FNP-C Internal medicine

## 2022-04-01 ENCOUNTER — Encounter: Payer: Self-pay | Admitting: Nurse Practitioner

## 2022-04-05 ENCOUNTER — Telehealth: Payer: Self-pay | Admitting: Nurse Practitioner

## 2022-04-05 NOTE — Telephone Encounter (Signed)
Spoke with pt mom she said she do self pay advised her to let her phar know and we don't do PA

## 2022-04-27 ENCOUNTER — Ambulatory Visit (INDEPENDENT_AMBULATORY_CARE_PROVIDER_SITE_OTHER): Payer: Medicaid Other | Admitting: Nurse Practitioner

## 2022-04-27 ENCOUNTER — Encounter: Payer: Self-pay | Admitting: Nurse Practitioner

## 2022-04-27 VITALS — BP 107/80 | HR 98 | Temp 97.5°F | Resp 16 | Ht 65.0 in | Wt 182.6 lb

## 2022-04-27 DIAGNOSIS — M25562 Pain in left knee: Secondary | ICD-10-CM | POA: Diagnosis not present

## 2022-04-27 DIAGNOSIS — E661 Drug-induced obesity: Secondary | ICD-10-CM

## 2022-04-27 DIAGNOSIS — Z79899 Other long term (current) drug therapy: Secondary | ICD-10-CM

## 2022-04-27 DIAGNOSIS — R635 Abnormal weight gain: Secondary | ICD-10-CM | POA: Diagnosis not present

## 2022-04-27 DIAGNOSIS — Z9889 Other specified postprocedural states: Secondary | ICD-10-CM

## 2022-04-27 DIAGNOSIS — B372 Candidiasis of skin and nail: Secondary | ICD-10-CM

## 2022-04-27 DIAGNOSIS — Z760 Encounter for issue of repeat prescription: Secondary | ICD-10-CM

## 2022-04-27 DIAGNOSIS — Z6830 Body mass index (BMI) 30.0-30.9, adult: Secondary | ICD-10-CM

## 2022-04-27 DIAGNOSIS — G8929 Other chronic pain: Secondary | ICD-10-CM

## 2022-04-27 DIAGNOSIS — R601 Generalized edema: Secondary | ICD-10-CM

## 2022-04-27 MED ORDER — NYSTATIN 100000 UNIT/GM EX POWD: Freq: Four times a day (QID) | CUTANEOUS | 2 refills | Status: AC

## 2022-04-27 MED ORDER — ACETAMINOPHEN-CODEINE 300-30 MG PO TABS: 1.0000 | ORAL_TABLET | Freq: Every evening | ORAL | 0 refills | Status: DC | PRN

## 2022-04-27 MED ORDER — PHENTERMINE HCL 37.5 MG PO TABS: ORAL_TABLET | ORAL | 0 refills | Status: DC

## 2022-04-27 MED ORDER — NYSTATIN 100000 UNIT/GM EX OINT: 1.0000 | TOPICAL_OINTMENT | Freq: Three times a day (TID) | CUTANEOUS | 3 refills | Status: AC

## 2022-04-27 MED ORDER — HYDROCHLOROTHIAZIDE 12.5 MG PO TABS: 12.5000 mg | ORAL_TABLET | Freq: Every day | ORAL | 3 refills | Status: DC

## 2022-04-27 NOTE — Progress Notes (Signed)
[facility] [place], [place]  [address]  Internal MEDICINE  Office Visit Note  Patient Name: Whitney Brandt  [id number]  [id number]  Date of Service: 04/27/2022  Chief Complaint  Patient presents with   Follow-up    Weight loss.     HPI Pema presents for a follow-up visit for  Weight loss management -- lost 3 more lbs.     Current Medication: Outpatient Encounter Medications as of 04/27/2022  Medication Sig   fluticasone (FLONASE) 50 MCG/ACT nasal spray Place 2 sprays into both nostrils daily as needed (allergies.).   ibuprofen (ADVIL) 800 MG tablet TAKE 1 TABLET (800 MG TOTAL) BY MOUTH 2 (TWO) TIMES DAILY AS NEEDED FOR MODERATE PAIN.   levocetirizine (XYZAL) 5 MG tablet Take 1 tab po day for allergic rhinitis   mupirocin ointment (BACTROBAN) 2 % Apply to affected areas twice daily as needed   [DISCONTINUED] acetaminophen-codeine (TYLENOL/CODEINE #3) 300-30 MG tablet Take 1 tablet by mouth at bedtime as needed.   [DISCONTINUED] ciprofloxacin-dexamethasone (CIPRODEX) OTIC suspension Place 4 drops in each ear two times daily for 5 days (DOS 03/03/22)   [DISCONTINUED] hydrochlorothiazide (HYDRODIURIL) 12.5 MG tablet Take 1 tablet (12.5 mg total) by mouth daily.   [DISCONTINUED] nystatin (MYCOSTATIN/NYSTOP) powder Apply topically 4 (four) times daily.   [DISCONTINUED] nystatin ointment (MYCOSTATIN) Apply 1 application topically 3 (three) times daily. (Patient taking differently: Apply 1 application  topically 3 (three) times daily as needed (sores under stomach).)   [DISCONTINUED] phentermine (ADIPEX-P) 37.5 MG tablet Take 1/2 tablet by mouth in the morning before breakfast, then take 1/2 tablet by mouth at midday.   acetaminophen-codeine (TYLENOL/CODEINE #3) 300-30 MG tablet Take 1 tablet by mouth at bedtime as needed.   hydrochlorothiazide (HYDRODIURIL) 12.5 MG tablet Take 1 tablet (12.5 mg total) by mouth daily.   nystatin (MYCOSTATIN/NYSTOP) powder  Apply topically 4 (four) times daily.   nystatin ointment (MYCOSTATIN) Apply 1 Application topically 3 (three) times daily.   phentermine (ADIPEX-P) 37.5 MG tablet Take 1/2 tablet by mouth in the morning before breakfast, then take 1/2 tablet by mouth at midday.   No facility-administered encounter medications on file as of 04/27/2022.    Surgical History: Past Surgical History:  Procedure Laterality Date   ABDOMINAL HYSTERECTOMY     CERUMEN REMOVAL Bilateral 03/03/2022   Procedure: CERUMEN REMOVAL;  Surgeon: Beverly Gust, MD;  Location: [place];  Service: ENT;  Laterality: Bilateral;   PANNICULECTOMY N/A 07/09/2019   Procedure: PANNICULECTOMY;  Surgeon: Cindra Presume, MD;  Location: [place];  Service: Plastics;  Laterality: N/A;  3 hours   REMOVAL OF EAR TUBE Bilateral 03/03/2022   Procedure: REMOVAL OF EAR TUBE;  Surgeon: Beverly Gust, MD;  Location: [place];  Service: ENT;  Laterality: Bilateral;    Medical History: Past Medical History:  Diagnosis Date   Diabetes mellitus without complication ([place])     " she has not had diabetes in 5 years since she lost weight. " per sister, [name], [place]   GERD (gastroesophageal reflux disease)    Intellectual disability    Panniculitis    Wears glasses     Family History: Family History  Problem Relation Age of Onset   Diabetes Mother    Hypertension Mother    Thyroid disease Mother     Social History   Socioeconomic History   Marital status: Single    Spouse name: Not on file   Number of children: Not on file  Years of education: Not on file   Highest education level: Not on file  Occupational History   Not on file  Tobacco Use   Smoking status: Never   Smokeless tobacco: Never  Vaping Use   Vaping Use: Never used  Substance and Sexual Activity   Alcohol use: No   Drug use: No   Sexual activity: Never  Other Topics Concern   Not on file  Social History Narrative   Not on file   Social  Determinants of Health   Financial Resource Strain: Not on file  Food Insecurity: Not on file  Transportation Needs: Not on file  Physical Activity: Not on file  Stress: Not on file  Social Connections: Not on file  Intimate Partner Violence: Not on file      Review of Systems  Vital Signs: BP 107/80   Pulse 98   Temp (!) 97.5 F (36.4 C)   Resp 16   Ht 5\' 5"  (1.651 m)   Wt 182 lb 9.6 oz (82.8 kg)   SpO2 93%   BMI 30.39 kg/m    Physical Exam     Assessment/Plan:   General Counseling: Allen verbalizes understanding of the findings of todays visit and agrees with plan of treatment. I have discussed any further diagnostic evaluation that may be needed or ordered today. We also reviewed her medications today. she has been encouraged to call the office with any questions or concerns that should arise related to todays visit.    No orders of the defined types were placed in this encounter.   Meds ordered this encounter  Medications   phentermine (ADIPEX-P) 37.5 MG tablet    Sig: Take 1/2 tablet by mouth in the morning before breakfast, then take 1/2 tablet by mouth at midday.    Dispense:  30 tablet    Refill:  0    E66.01 dx code   hydrochlorothiazide (HYDRODIURIL) 12.5 MG tablet    Sig: Take 1 tablet (12.5 mg total) by mouth daily.    Dispense:  90 tablet    Refill:  3   nystatin ointment (MYCOSTATIN)    Sig: Apply 1 Application topically 3 (three) times daily.    Dispense:  90 g    Refill:  3   nystatin (MYCOSTATIN/NYSTOP) powder    Sig: Apply topically 4 (four) times daily.    Dispense:  60 g    Refill:  2   acetaminophen-codeine (TYLENOL/CODEINE #3) 300-30 MG tablet    Sig: Take 1 tablet by mouth at bedtime as needed.    Dispense:  90 tablet    Refill:  0    This is not for acute pain. 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    Return in about 4 weeks (around 05/25/2022) for F/U, Weight loss, Keller Mikels PCP.   Total  time spent:*** Minutes Time spent includes review of chart, medications, test results, and follow up plan with the patient.   [place] Controlled Substance Database was reviewed by me.  This patient was seen by Jonetta Osgood, FNP-C in collaboration with Dr. Clayborn Bigness as a part of collaborative care agreement.   Shelie Lansing R. Valetta Fuller, MSN, FNP-C Internal medicine

## 2022-04-28 ENCOUNTER — Encounter: Payer: Self-pay | Admitting: Nurse Practitioner

## 2022-04-28 DIAGNOSIS — G8929 Other chronic pain: Secondary | ICD-10-CM | POA: Insufficient documentation

## 2022-05-02 ENCOUNTER — Telehealth: Payer: Self-pay

## 2022-05-02 NOTE — Telephone Encounter (Signed)
Left message for patient mother to give clinic a call.

## 2022-05-02 NOTE — Telephone Encounter (Signed)
Pa was send and approved for the tylenol 3.

## 2022-05-25 ENCOUNTER — Ambulatory Visit: Payer: Medicaid Other | Admitting: Nurse Practitioner

## 2022-06-09 ENCOUNTER — Ambulatory Visit: Payer: Medicaid Other | Admitting: Nurse Practitioner

## 2022-06-09 ENCOUNTER — Encounter: Payer: Self-pay | Admitting: Nurse Practitioner

## 2022-06-09 VITALS — BP 117/79 | HR 90 | Temp 96.8°F | Resp 16 | Ht 65.0 in | Wt 180.0 lb

## 2022-06-09 DIAGNOSIS — M25562 Pain in left knee: Secondary | ICD-10-CM | POA: Diagnosis not present

## 2022-06-09 DIAGNOSIS — G8929 Other chronic pain: Secondary | ICD-10-CM | POA: Diagnosis not present

## 2022-06-09 DIAGNOSIS — E661 Drug-induced obesity: Secondary | ICD-10-CM

## 2022-06-09 DIAGNOSIS — R635 Abnormal weight gain: Secondary | ICD-10-CM | POA: Diagnosis not present

## 2022-06-09 DIAGNOSIS — Z6830 Body mass index (BMI) 30.0-30.9, adult: Secondary | ICD-10-CM

## 2022-06-09 MED ORDER — PHENTERMINE HCL 37.5 MG PO TABS: ORAL_TABLET | ORAL | 0 refills | Status: DC

## 2022-06-09 MED ORDER — ACETAMINOPHEN-CODEINE 300-60 MG PO TABS: 0.5000 | ORAL_TABLET | Freq: Every evening | ORAL | 0 refills | Status: DC | PRN

## 2022-06-09 NOTE — Progress Notes (Signed)
[facility] [place], [place] [address]  Internal MEDICINE  Office Visit Note  Patient Name: Whitney Brandt  [id number]  [id number]  Date of Service: 06/09/2022  Chief Complaint  Patient presents with   Follow-up    Weight loss    HPI Cashe presents for a follow-up visit for weight loss and chronic knee pain.  Weight loss -- lost 2 more lbs. Wants to continue phentermine  Tylenol #3 is backordered, need alternative. For chronic left knee pain Has been eating better diet and increasing physical activity      Current Medication: Outpatient Encounter Medications as of 06/09/2022  Medication Sig   acetaminophen-codeine (TYLENOL #4) 300-60 MG tablet Take 0.5 tablets by mouth at bedtime as needed for moderate pain or severe pain.   acetaminophen-codeine (TYLENOL/CODEINE #3) 300-30 MG tablet Take 1 tablet by mouth at bedtime as needed.   fluticasone (FLONASE) 50 MCG/ACT nasal spray Place 2 sprays into both nostrils daily as needed (allergies.).   hydrochlorothiazide (HYDRODIURIL) 12.5 MG tablet Take 1 tablet (12.5 mg total) by mouth daily.   ibuprofen (ADVIL) 800 MG tablet TAKE 1 TABLET (800 MG TOTAL) BY MOUTH 2 (TWO) TIMES DAILY AS NEEDED FOR MODERATE PAIN.   levocetirizine (XYZAL) 5 MG tablet Take 1 tab po day for allergic rhinitis   mupirocin ointment (BACTROBAN) 2 % Apply to affected areas twice daily as needed   nystatin (MYCOSTATIN/NYSTOP) powder Apply topically 4 (four) times daily.   nystatin ointment (MYCOSTATIN) Apply 1 Application topically 3 (three) times daily.   [DISCONTINUED] phentermine (ADIPEX-P) 37.5 MG tablet Take 1/2 tablet by mouth in the morning before breakfast, then take 1/2 tablet by mouth at midday.   phentermine (ADIPEX-P) 37.5 MG tablet Take 1/2 tablet by mouth in the morning before breakfast, then take 1/2 tablet by mouth at midday.   No facility-administered encounter medications on file as of 06/09/2022.    Surgical  History: Past Surgical History:  Procedure Laterality Date   ABDOMINAL HYSTERECTOMY     CERUMEN REMOVAL Bilateral 03/03/2022   Procedure: CERUMEN REMOVAL;  Surgeon: Beverly Gust, MD;  Location: [place];  Service: ENT;  Laterality: Bilateral;   PANNICULECTOMY N/A 07/09/2019   Procedure: PANNICULECTOMY;  Surgeon: Cindra Presume, MD;  Location: [place];  Service: Plastics;  Laterality: N/A;  3 hours   REMOVAL OF EAR TUBE Bilateral 03/03/2022   Procedure: REMOVAL OF EAR TUBE;  Surgeon: Beverly Gust, MD;  Location: [place];  Service: ENT;  Laterality: Bilateral;    Medical History: Past Medical History:  Diagnosis Date   Diabetes mellitus without complication ([place])     " she has not had diabetes in 5 years since she lost weight. " per sister, [name], [place]   GERD (gastroesophageal reflux disease)    Intellectual disability    Panniculitis    Wears glasses     Family History: Family History  Problem Relation Age of Onset   Diabetes Mother    Hypertension Mother    Thyroid disease Mother     Social History   Socioeconomic History   Marital status: Single    Spouse name: Not on file   Number of children: Not on file   Years of education: Not on file   Highest education level: Not on file  Occupational History   Not on file  Tobacco Use   Smoking status: Never   Smokeless tobacco: Never  Vaping Use   Vaping Use: Never used  Substance  and Sexual Activity   Alcohol use: No   Drug use: No   Sexual activity: Never  Other Topics Concern   Not on file  Social History Narrative   Not on file   Social Determinants of Health   Financial Resource Strain: Not on file  Food Insecurity: Not on file  Transportation Needs: Not on file  Physical Activity: Not on file  Stress: Not on file  Social Connections: Not on file  Intimate Partner Violence: Not on file      Review of Systems  Constitutional: Negative.  Negative for chills, fatigue and  unexpected weight change.  HENT:  Negative for congestion, rhinorrhea, sneezing and sore throat.   Eyes:  Negative for redness.  Respiratory: Negative.  Negative for cough, chest tightness and shortness of breath.   Cardiovascular: Negative.  Negative for chest pain and palpitations.  Gastrointestinal:  Negative for abdominal pain, constipation, diarrhea, nausea and vomiting.  Genitourinary:  Negative for dysuria and frequency.  Musculoskeletal:  Positive for arthralgias. Negative for back pain, joint swelling and neck pain.  Skin:  Negative for rash.  Neurological: Negative.  Negative for tremors and numbness.  Hematological:  Negative for adenopathy. Does not bruise/bleed easily.  Psychiatric/Behavioral:  Negative for behavioral problems (Depression), sleep disturbance and suicidal ideas. The patient is not nervous/anxious.     Vital Signs: BP 117/79   Pulse 90   Temp (!) 96.8 F (36 C)   Resp 16   Ht 5\' 5"  (1.651 m)   Wt 180 lb (81.6 kg)   SpO2 97%   BMI 29.95 kg/m    Physical Exam Vitals reviewed.  Constitutional:      General: [name] not in acute distress.    Appearance: Normal appearance. [name] well-developed. [name] obese. [name] not diaphoretic.  HENT:     Head: Normocephalic and atraumatic.  Eyes:     Pupils: Pupils are equal, round, and reactive to light.  Neck:     Thyroid: No thyromegaly.     Vascular: No JVD.     Trachea: No tracheal deviation.  Cardiovascular:     Rate and Rhythm: Normal rate and regular rhythm.     Heart sounds: Normal heart sounds. No murmur heard.    No friction rub. No gallop.  Pulmonary:     Effort: Pulmonary effort is normal. No respiratory distress.     Breath sounds: Normal breath sounds. No wheezing or rales.  Chest:     Chest wall: No tenderness.  Neurological:     Mental Status: [name] alert and oriented to person, place, and time.     Cranial Nerves: No cranial nerve deficit.  Psychiatric:        Mood and Affect: Mood  normal.        Behavior: Behavior normal.        Thought Content: Thought content normal.        Judgment: Judgment normal.        Assessment/Plan: 1. Abnormal weight gain Continuing to show progress, continue phentermine, follow up in 4 weeks - phentermine (ADIPEX-P) 37.5 MG tablet; Take 1/2 tablet by mouth in the morning before breakfast, then take 1/2 tablet by mouth at midday.  Dispense: 30 tablet; Refill: 0  2. Class 1 drug-induced obesity without serious comorbidity with body mass index (BMI) of 30.0 to 30.9 in adult Continue phentermine x1 month, follow up in 4 weeks  - phentermine (ADIPEX-P) 37.5 MG tablet; Take 1/2 tablet by mouth  in the morning before breakfast, then take 1/2 tablet by mouth at midday.  Dispense: 30 tablet; Refill: 0  3. Chronic pain of left knee Changed pain med to tylenol #4 since #3 is on back order. This is for chronic pain of left knee - acetaminophen-codeine (TYLENOL #4) 300-60 MG tablet; Take 0.5 tablets by mouth at bedtime as needed for moderate pain or severe pain.  Dispense: 45 tablet; Refill: 0   General Counseling: Kelsie verbalizes understanding of the findings of todays visit and agrees with plan of treatment. I have discussed any further diagnostic evaluation that may be needed or ordered today. We also reviewed her medications today. she has been encouraged to call the office with any questions or concerns that should arise related to todays visit.    No orders of the defined types were placed in this encounter.   Meds ordered this encounter  Medications   acetaminophen-codeine (TYLENOL #4) 300-60 MG tablet    Sig: Take 0.5 tablets by mouth at bedtime as needed for moderate pain or severe pain.    Dispense:  45 tablet    Refill:  0    Please fill this script as a replacement for tylenol #3 since it is backordered.   phentermine (ADIPEX-P) 37.5 MG tablet    Sig: Take 1/2 tablet by mouth in the morning before breakfast, then take 1/2  tablet by mouth at midday.    Dispense:  30 tablet    Refill:  0    E66.01 dx code    Return in about 4 weeks (around 07/07/2022) for F/U, Weight loss, Armie Moren PCP.   Total time spent:30 Minutes Time spent includes review of chart, medications, test results, and follow up plan with the patient.   [place] Controlled Substance Database was reviewed by me.  This patient was seen by Jonetta Osgood, FNP-C in collaboration with Dr. Clayborn Bigness as a part of collaborative care agreement.   Kirbie Stodghill R. Valetta Fuller, MSN, FNP-C Internal medicine

## 2022-06-11 ENCOUNTER — Encounter: Payer: Self-pay | Admitting: Nurse Practitioner

## 2022-06-14 ENCOUNTER — Other Ambulatory Visit: Payer: Self-pay | Admitting: Nurse Practitioner

## 2022-06-14 MED ORDER — HYDROCODONE-ACETAMINOPHEN 5-325 MG PO TABS: 1.0000 | ORAL_TABLET | Freq: Four times a day (QID) | ORAL | 0 refills | Status: DC | PRN

## 2022-06-14 MED ORDER — TRAMADOL HCL 25 MG PO TABS: 1.0000 | ORAL_TABLET | Freq: Every evening | ORAL | 1 refills | Status: DC | PRN

## 2022-06-14 NOTE — Progress Notes (Signed)
Tylenol #2, 3 and 4 are backordered at the [facility].

## 2022-06-15 ENCOUNTER — Other Ambulatory Visit: Payer: Self-pay | Admitting: Nurse Practitioner

## 2022-06-15 MED ORDER — HYDROCODONE-ACETAMINOPHEN 5-325 MG PO TABS: 1.0000 | ORAL_TABLET | Freq: Four times a day (QID) | ORAL | 0 refills | Status: DC | PRN

## 2022-07-07 ENCOUNTER — Encounter: Payer: Self-pay | Admitting: Nurse Practitioner

## 2022-07-07 ENCOUNTER — Ambulatory Visit: Payer: Medicaid Other | Admitting: Nurse Practitioner

## 2022-07-07 VITALS — BP 105/77 | HR 100 | Temp 97.9°F | Resp 16 | Ht 65.0 in | Wt 177.8 lb

## 2022-07-07 DIAGNOSIS — M25562 Pain in left knee: Secondary | ICD-10-CM

## 2022-07-07 DIAGNOSIS — G8929 Other chronic pain: Secondary | ICD-10-CM

## 2022-07-07 DIAGNOSIS — R635 Abnormal weight gain: Secondary | ICD-10-CM | POA: Diagnosis not present

## 2022-07-07 DIAGNOSIS — E663 Overweight: Secondary | ICD-10-CM | POA: Diagnosis not present

## 2022-07-07 DIAGNOSIS — E661 Drug-induced obesity: Secondary | ICD-10-CM

## 2022-07-07 DIAGNOSIS — Z6829 Body mass index (BMI) 29.0-29.9, adult: Secondary | ICD-10-CM

## 2022-07-07 MED ORDER — PHENTERMINE HCL 37.5 MG PO TABS: ORAL_TABLET | ORAL | 0 refills | Status: DC

## 2022-07-07 MED ORDER — HYDROCODONE-ACETAMINOPHEN 5-325 MG PO TABS: 1.0000 | ORAL_TABLET | Freq: Every evening | ORAL | 0 refills | Status: DC | PRN

## 2022-07-07 MED ORDER — ACETAMINOPHEN-CODEINE 300-30 MG PO TABS: 1.0000 | ORAL_TABLET | Freq: Every evening | ORAL | 0 refills | Status: DC | PRN

## 2022-07-07 NOTE — Progress Notes (Signed)
[facility] [address] [place], [place] [address]  Internal MEDICINE  Office Visit Note  Patient Name: Whitney Brandt  [id number]  [id number]  Date of Service: 07/07/2022  Chief Complaint  Patient presents with   Diabetes   Gastroesophageal Reflux   Follow-up    Weight loss     HPI Whitney Brandt presents for a follow-up visit for weight loss management and chronic left knee pain Weight loss -- Continues to lose weight, 4 more lbs since last visit. Continue to take phentermine.  Chronic left knee pain -- has improved some with the weight loss.  BMI decreased from obese range to overweight.    Current Medication: Outpatient Encounter Medications as of 07/07/2022  Medication Sig   acetaminophen-codeine (TYLENOL #3) 300-30 MG tablet Take 1 tablet by mouth at bedtime as needed for moderate pain or severe pain. Take one tab po qhs for cough   fluticasone (FLONASE) 50 MCG/ACT nasal spray Place 2 sprays into both nostrils daily as needed (allergies.).   hydrochlorothiazide (HYDRODIURIL) 12.5 MG tablet Take 1 tablet (12.5 mg total) by mouth daily.   ibuprofen (ADVIL) 800 MG tablet TAKE 1 TABLET (800 MG TOTAL) BY MOUTH 2 (TWO) TIMES DAILY AS NEEDED FOR MODERATE PAIN.   levocetirizine (XYZAL) 5 MG tablet Take 1 tab po day for allergic rhinitis   mupirocin ointment (BACTROBAN) 2 % Apply to affected areas twice daily as needed   nystatin (MYCOSTATIN/NYSTOP) powder Apply topically 4 (four) times daily.   nystatin ointment (MYCOSTATIN) Apply 1 Application topically 3 (three) times daily.   [DISCONTINUED] HYDROcodone-acetaminophen (NORCO/VICODIN) 5-325 MG tablet Take 1 tablet by mouth every 6 (six) hours as needed for moderate pain or severe pain.   [DISCONTINUED] phentermine (ADIPEX-P) 37.5 MG tablet Take 1/2 tablet by mouth in the morning before breakfast, then take 1/2 tablet by mouth at midday.   [DISCONTINUED] traMADol HCl 25 MG TABS Take 1 tablet by mouth at bedtime as needed  (moderate to severe pain).   HYDROcodone-acetaminophen (NORCO/VICODIN) 5-325 MG tablet Take 1 tablet by mouth at bedtime as needed for moderate pain or severe pain.   phentermine (ADIPEX-P) 37.5 MG tablet Take 1/2 tablet by mouth in the morning before breakfast, then take 1/2 tablet by mouth at midday.   No facility-administered encounter medications on file as of 07/07/2022.    Surgical History: Past Surgical History:  Procedure Laterality Date   ABDOMINAL HYSTERECTOMY     CERUMEN REMOVAL Bilateral 03/03/2022   Procedure: CERUMEN REMOVAL;  Surgeon: Linus Salmons, MD;  Location: [facility];  Service: ENT;  Laterality: Bilateral;   PANNICULECTOMY N/A 07/09/2019   Procedure: PANNICULECTOMY;  Surgeon: Allena Napoleon, MD;  Location: [facility];  Service: Plastics;  Laterality: N/A;  3 hours   REMOVAL OF EAR TUBE Bilateral 03/03/2022   Procedure: REMOVAL OF EAR TUBE;  Surgeon: Linus Salmons, MD;  Location: [facility];  Service: ENT;  Laterality: Bilateral;    Medical History: Past Medical History:  Diagnosis Date   Diabetes mellitus without complication     " she has not had diabetes in 5 years since she lost weight. " per sister, [name], [place]   GERD (gastroesophageal reflux disease)    Intellectual disability    Panniculitis    Wears glasses     Family History: Family History  Problem Relation Age of Onset   Diabetes Mother    Hypertension Mother    Thyroid disease Mother     Social History   Socioeconomic  History   Marital status: Single    Spouse name: Not on file   Number of children: Not on file   Years of education: Not on file   Highest education level: Not on file  Occupational History   Not on file  Tobacco Use   Smoking status: Never   Smokeless tobacco: Never  Vaping Use   Vaping Use: Never used  Substance and Sexual Activity   Alcohol use: No   Drug use: No   Sexual activity: Never  Other Topics Concern   Not on file  Social History  Narrative   Not on file   Social Determinants of Health   Financial Resource Strain: Not on file  Food Insecurity: Not on file  Transportation Needs: Not on file  Physical Activity: Not on file  Stress: Not on file  Social Connections: Not on file  Intimate Partner Violence: Not on file      Review of Systems  Constitutional: Negative.  Negative for chills, fatigue and unexpected weight change.  HENT:  Negative for congestion, rhinorrhea, sneezing and sore throat.   Eyes:  Negative for redness.  Respiratory: Negative.  Negative for cough, chest tightness and shortness of breath.   Cardiovascular: Negative.  Negative for chest pain and palpitations.  Gastrointestinal:  Negative for abdominal pain, constipation, diarrhea, nausea and vomiting.  Genitourinary:  Negative for dysuria and frequency.  Musculoskeletal:  Positive for arthralgias. Negative for back pain, joint swelling and neck pain.  Skin:  Negative for rash.  Neurological: Negative.  Negative for tremors and numbness.  Hematological:  Negative for adenopathy. Does not bruise/bleed easily.  Psychiatric/Behavioral:  Negative for behavioral problems (Depression), sleep disturbance and suicidal ideas. The patient is not nervous/anxious.     Vital Signs: BP 105/77   Pulse 100   Temp 97.9 F (36.6 C)   Resp 16   Ht  (1.651 m)   Wt 177 lb 12.8 oz (80.6 kg)   SpO2 93%   BMI 29.59 kg/m    Physical Exam Vitals reviewed.  Constitutional:      General: [name] not in acute distress.    Appearance: Normal appearance. [name] well-developed. [name] obese. [name] not diaphoretic.  HENT:     Head: Normocephalic and atraumatic.  Eyes:     Pupils: Pupils are equal, round, and reactive to light.  Neck:     Thyroid: No thyromegaly.     Vascular: No JVD.     Trachea: No tracheal deviation.  Cardiovascular:     Rate and Rhythm: Normal rate and regular rhythm.     Heart sounds: Normal heart sounds. No murmur heard.     No friction rub. No gallop.  Pulmonary:     Effort: Pulmonary effort is normal. No respiratory distress.     Breath sounds: Normal breath sounds. No wheezing or rales.  Chest:     Chest wall: No tenderness.  Neurological:     Mental Status: [name] alert and oriented to person, place, and time.     Cranial Nerves: No cranial nerve deficit.  Psychiatric:        Mood and Affect: Mood normal.        Behavior: Behavior normal.        Thought Content: Thought content normal.        Judgment: Judgment normal.        Assessment/Plan: 1. Chronic pain of left knee Tylenol #3 is on backorder, hydrocodone prescribed as a  substitute until tylenol # 3 is available again.  - HYDROcodone-acetaminophen (NORCO/VICODIN) 5-325 MG tablet; Take 1 tablet by mouth at bedtime as needed for moderate pain or severe pain.  Dispense: 30 tablet; Refill: 0 - acetaminophen-codeine (TYLENOL #3) 300-30 MG tablet; Take 1 tablet by mouth at bedtime as needed for moderate pain or severe pain. Take one tab po qhs for cough  Dispense: 90 tablet; Refill: 0  2. Abnormal weight gain Continue phentermine x1 month, follow up in 4 weeks, continued weight loss x4 lbs.  - phentermine (ADIPEX-P) 37.5 MG tablet; Take 1/2 tablet by mouth in the morning before breakfast, then take 1/2 tablet by mouth at midday.  Dispense: 30 tablet; Refill: 0  3. Overweight with body mass index (BMI) of 29 to 29.9 in adult Continue phentermine x1 month as prescribed, follow up in 4 weeks  - phentermine (ADIPEX-P) 37.5 MG tablet; Take 1/2 tablet by mouth in the morning before breakfast, then take 1/2 tablet by mouth at midday.  Dispense: 30 tablet; Refill: 0   General Counseling: Tashari verbalizes understanding of the findings of todays visit and agrees with plan of treatment. I have discussed any further diagnostic evaluation that may be needed or ordered today. We also reviewed her medications today. she has been encouraged to call the office  with any questions or concerns that should arise related to todays visit.    No orders of the defined types were placed in this encounter.   Meds ordered this encounter  Medications   HYDROcodone-acetaminophen (NORCO/VICODIN) 5-325 MG tablet    Sig: Take 1 tablet by mouth at bedtime as needed for moderate pain or severe pain.    Dispense:  30 tablet    Refill:  0    Refill, see change in directions and number of tablets. Please fill this script if tylenol #3 is not available.   acetaminophen-codeine (TYLENOL #3) 300-30 MG tablet    Sig: Take 1 tablet by mouth at bedtime as needed for moderate pain or severe pain. Take one tab po qhs for cough    Dispense:  90 tablet    Refill:  0    Please refill if available, if not please fill hydrocodone script.   phentermine (ADIPEX-P) 37.5 MG tablet    Sig: Take 1/2 tablet by mouth in the morning before breakfast, then take 1/2 tablet by mouth at midday.    Dispense:  30 tablet    Refill:  0    E66.01 dx code    Return in about 4 weeks (around 08/04/2022) for F/U, Weight loss, Fiorela Pelzer PCP.   Total time spent:30 Minutes Time spent includes review of chart, medications, test results, and follow up plan with the patient.   [place] Controlled Substance Database was reviewed by me.  This patient was seen by Sallyanne Kuster, FNP-C in collaboration with Dr. Beverely Risen as a part of collaborative care agreement.   Shanan Mcmiller R. Tedd Sias, MSN, FNP-C Internal medicine

## 2022-07-08 ENCOUNTER — Telehealth: Payer: Self-pay

## 2022-07-08 NOTE — Telephone Encounter (Signed)
PA was done for HYDROcodone-Acetaminophen.

## 2022-07-23 ENCOUNTER — Encounter: Payer: Self-pay | Admitting: Nurse Practitioner

## 2022-08-01 ENCOUNTER — Encounter: Payer: Medicaid Other | Admitting: Nurse Practitioner

## 2022-08-03 ENCOUNTER — Ambulatory Visit: Payer: Medicaid Other | Admitting: Nurse Practitioner

## 2022-08-22 ENCOUNTER — Ambulatory Visit: Payer: Medicaid Other | Admitting: Nurse Practitioner

## 2022-08-22 ENCOUNTER — Encounter: Payer: Self-pay | Admitting: Nurse Practitioner

## 2022-08-22 VITALS — BP 110/80 | HR 71 | Temp 98.3°F | Resp 16 | Ht 65.0 in | Wt 183.0 lb

## 2022-08-22 DIAGNOSIS — J3089 Other allergic rhinitis: Secondary | ICD-10-CM | POA: Diagnosis not present

## 2022-08-22 DIAGNOSIS — R635 Abnormal weight gain: Secondary | ICD-10-CM | POA: Diagnosis not present

## 2022-08-22 DIAGNOSIS — E661 Drug-induced obesity: Secondary | ICD-10-CM

## 2022-08-22 DIAGNOSIS — G8929 Other chronic pain: Secondary | ICD-10-CM

## 2022-08-22 DIAGNOSIS — M25562 Pain in left knee: Secondary | ICD-10-CM | POA: Diagnosis not present

## 2022-08-22 DIAGNOSIS — Z6830 Body mass index (BMI) 30.0-30.9, adult: Secondary | ICD-10-CM

## 2022-08-22 MED ORDER — HYDROCODONE-ACETAMINOPHEN 5-325 MG PO TABS: 1.0000 | ORAL_TABLET | Freq: Every evening | ORAL | 0 refills | Status: AC | PRN

## 2022-08-22 MED ORDER — PHENTERMINE HCL 37.5 MG PO TABS: ORAL_TABLET | ORAL | 1 refills | Status: DC

## 2022-08-22 MED ORDER — LEVOCETIRIZINE DIHYDROCHLORIDE 5 MG PO TABS: ORAL_TABLET | ORAL | 3 refills | Status: AC

## 2022-08-22 NOTE — Progress Notes (Signed)
[facility] [address] [place], [place] [address]  Internal MEDICINE  Office Visit Note  Patient Name: Whitney Brandt  [id number]  [id number]  Date of Service: 08/22/2022  Chief Complaint  Patient presents with   Follow-up   Weight Loss   Diabetes   Gastroesophageal Reflux   Quality Metric Gaps    TDAP    HPI Whitney Brandt presents for a follow-up visit for weight and chronic pain.  Weight loss -- gained 6 lbs, had missed an appointment and ran out of the medication about a week ago. She was eating extra food and snacks. She has started going walking again.  Chronic left knee pain -- tylenol #3 is still on backorder at her pharmacy. Will continue using hydrocodone instead as needed at bedtime.  Allergies -- takes levocetirizine, needs her prescription renewed.     Current Medication: Outpatient Encounter Medications as of 08/22/2022  Medication Sig   acetaminophen-codeine (TYLENOL #3) 300-30 MG tablet Take 1 tablet by mouth at bedtime as needed for moderate pain or severe pain. Take one tab po qhs for cough   fluticasone (FLONASE) 50 MCG/ACT nasal spray Place 2 sprays into both nostrils daily as needed (allergies.).   hydrochlorothiazide (HYDRODIURIL) 12.5 MG tablet Take 1 tablet (12.5 mg total) by mouth daily.   ibuprofen (ADVIL) 800 MG tablet TAKE 1 TABLET (800 MG TOTAL) BY MOUTH 2 (TWO) TIMES DAILY AS NEEDED FOR MODERATE PAIN.   mupirocin ointment (BACTROBAN) 2 % Apply to affected areas twice daily as needed   nystatin (MYCOSTATIN/NYSTOP) powder Apply topically 4 (four) times daily.   nystatin ointment (MYCOSTATIN) Apply 1 Application topically 3 (three) times daily.   [DISCONTINUED] HYDROcodone-acetaminophen (NORCO/VICODIN) 5-325 MG tablet Take 1 tablet by mouth at bedtime as needed for moderate pain or severe pain.   [DISCONTINUED] levocetirizine (XYZAL) 5 MG tablet Take 1 tab po day for allergic rhinitis   [DISCONTINUED] phentermine (ADIPEX-P) 37.5 MG tablet  Take 1/2 tablet by mouth in the morning before breakfast, then take 1/2 tablet by mouth at midday.   HYDROcodone-acetaminophen (NORCO/VICODIN) 5-325 MG tablet Take 1 tablet by mouth at bedtime as needed for moderate pain or severe pain.   HYDROcodone-acetaminophen (NORCO/VICODIN) 5-325 MG tablet Take 1 tablet by mouth at bedtime as needed for moderate pain or severe pain.   levocetirizine (XYZAL) 5 MG tablet Take 1 tab po day for allergic rhinitis   phentermine (ADIPEX-P) 37.5 MG tablet Take 1/2 tablet by mouth in the morning before breakfast, then take 1/2 tablet by mouth at midday.   No facility-administered encounter medications on file as of 08/22/2022.    Surgical History: Past Surgical History:  Procedure Laterality Date   ABDOMINAL HYSTERECTOMY     CERUMEN REMOVAL Bilateral 03/03/2022   Procedure: CERUMEN REMOVAL;  Surgeon: Linus Salmons, MD;  Location: [facility];  Service: ENT;  Laterality: Bilateral;   PANNICULECTOMY N/A 07/09/2019   Procedure: PANNICULECTOMY;  Surgeon: Allena Napoleon, MD;  Location: [facility] OR;  Service: Plastics;  Laterality: N/A;  3 hours   REMOVAL OF EAR TUBE Bilateral 03/03/2022   Procedure: REMOVAL OF EAR TUBE;  Surgeon: Linus Salmons, MD;  Location: [facility];  Service: ENT;  Laterality: Bilateral;    Medical History: Past Medical History:  Diagnosis Date   Diabetes mellitus without complication (HCC)     " she has not had diabetes in 5 years since she lost weight. " per sister, [name], [place]   GERD (gastroesophageal reflux disease)  Intellectual disability    Panniculitis    Wears glasses     Family History: Family History  Problem Relation Age of Onset   Diabetes Mother    Hypertension Mother    Thyroid disease Mother     Social History   Socioeconomic History   Marital status: Single    Spouse name: Not on file   Number of children: Not on file   Years of education: Not on file   Highest education level: Not on file   Occupational History   Not on file  Tobacco Use   Smoking status: Never   Smokeless tobacco: Never  Vaping Use   Vaping Use: Never used  Substance and Sexual Activity   Alcohol use: No   Drug use: No   Sexual activity: Never  Other Topics Concern   Not on file  Social History Narrative   Not on file   Social Determinants of Health   Financial Resource Strain: Not on file  Food Insecurity: Not on file  Transportation Needs: Not on file  Physical Activity: Not on file  Stress: Not on file  Social Connections: Not on file  Intimate Partner Violence: Not on file      Review of Systems  Constitutional: Negative.  Negative for chills, fatigue and unexpected weight change.  HENT:  Negative for congestion, rhinorrhea, sneezing and sore throat.   Eyes:  Negative for redness.  Respiratory: Negative.  Negative for cough, chest tightness and shortness of breath.   Cardiovascular: Negative.  Negative for chest pain and palpitations.  Gastrointestinal:  Negative for abdominal pain, constipation, diarrhea, nausea and vomiting.  Genitourinary:  Negative for dysuria and frequency.  Musculoskeletal:  Positive for arthralgias. Negative for back pain, joint swelling and neck pain.  Skin:  Negative for rash.  Neurological: Negative.  Negative for tremors and numbness.  Hematological:  Negative for adenopathy. Does not bruise/bleed easily.  Psychiatric/Behavioral:  Negative for behavioral problems (Depression), sleep disturbance and suicidal ideas. The patient is not nervous/anxious.     Vital Signs: BP 110/80   Pulse 71   Temp 98.3 F (36.8 C)   Resp 16   Ht 5\' 5"  (1.651 m)   Wt 183 lb (83 kg)   SpO2 97%   BMI 30.45 kg/m    Physical Exam Vitals reviewed.  Constitutional:      General: [name] not in acute distress.    Appearance: Normal appearance. [name] well-developed. [name] obese. [name] not diaphoretic.  HENT:     Head: Normocephalic and atraumatic.  Eyes:      Pupils: Pupils are equal, round, and reactive to light.  Neck:     Thyroid: No thyromegaly.     Vascular: No JVD.     Trachea: No tracheal deviation.  Cardiovascular:     Rate and Rhythm: Normal rate and regular rhythm.     Heart sounds: Normal heart sounds. No murmur heard.    No friction rub. No gallop.  Pulmonary:     Effort: Pulmonary effort is normal. No respiratory distress.     Breath sounds: Normal breath sounds. No wheezing or rales.  Chest:     Chest wall: No tenderness.  Neurological:     Mental Status: [name] alert and oriented to person, place, and time.     Cranial Nerves: No cranial nerve deficit.  Psychiatric:        Mood and Affect: Mood normal.        Behavior:  Behavior normal.        Thought Content: Thought content normal.        Judgment: Judgment normal.        Assessment/Plan: 1. Chronic pain of left knee Continue prn hydrocodone as prescribed for moderate to severe pain  - HYDROcodone-acetaminophen (NORCO/VICODIN) 5-325 MG tablet; Take 1 tablet by mouth at bedtime as needed for moderate pain or severe pain.  Dispense: 30 tablet; Refill: 0 - HYDROcodone-acetaminophen (NORCO/VICODIN) 5-325 MG tablet; Take 1 tablet by mouth at bedtime as needed for moderate pain or severe pain.  Dispense: 30 tablet; Refill: 0  2. Non-seasonal allergic rhinitis due to other allergic trigger Continue levocetirizine as prescribed.  - levocetirizine (XYZAL) 5 MG tablet; Take 1 tab po day for allergic rhinitis  Dispense: 90 tablet; Refill: 3  3. Abnormal weight gain Continue phentermine as prescribed. Continue exercise and diet modifications as discussed. Follow up in 8 weeks  - phentermine (ADIPEX-P) 37.5 MG tablet; Take 1/2 tablet by mouth in the morning before breakfast, then take 1/2 tablet by mouth at midday.  Dispense: 30 tablet; Refill: 1  4. Class 1 drug-induced obesity without serious comorbidity with body mass index (BMI) of 30.0 to 30.9 in adult Continue  phentermine as prescribed. Follow up in 8 weeks.  - phentermine (ADIPEX-P) 37.5 MG tablet; Take 1/2 tablet by mouth in the morning before breakfast, then take 1/2 tablet by mouth at midday.  Dispense: 30 tablet; Refill: 1     General Counseling: Nyrah verbalizes understanding of the findings of todays visit and agrees with plan of treatment. I have discussed any further diagnostic evaluation that may be needed or ordered today. We also reviewed her medications today. she has been encouraged to call the office with any questions or concerns that should arise related to todays visit.    No orders of the defined types were placed in this encounter.   Meds ordered this encounter  Medications   phentermine (ADIPEX-P) 37.5 MG tablet    Sig: Take 1/2 tablet by mouth in the morning before breakfast, then take 1/2 tablet by mouth at midday.    Dispense:  30 tablet    Refill:  1    E66.01 dx code   HYDROcodone-acetaminophen (NORCO/VICODIN) 5-325 MG tablet    Sig: Take 1 tablet by mouth at bedtime as needed for moderate pain or severe pain.    Dispense:  30 tablet    Refill:  0    Refill, see change in directions and number of tablets. Please fill this script if tylenol #3 is not available.   HYDROcodone-acetaminophen (NORCO/VICODIN) 5-325 MG tablet    Sig: Take 1 tablet by mouth at bedtime as needed for moderate pain or severe pain.    Dispense:  30 tablet    Refill:  0    Refill, see change in directions and number of tablets. Please fill this script if tylenol #3 is not available.   levocetirizine (XYZAL) 5 MG tablet    Sig: Take 1 tab po day for allergic rhinitis    Dispense:  90 tablet    Refill:  3    Return in about 8 weeks (around 10/17/2022) for F/U, Weight loss, Monta Maiorana PCP.   Total time spent:30 Minutes Time spent includes review of chart, medications, test results, and follow up plan with the patient.    Controlled Substance Database was reviewed by me.  This patient  was seen by Sallyanne Kuster, FNP-C in collaboration with Dr. Beverely Risen  as a part of collaborative care agreement.   Chanci Ojala R. Tedd Sias, MSN, FNP-C Internal medicine

## 2022-10-19 ENCOUNTER — Ambulatory Visit: Payer: Medicaid Other | Admitting: Nurse Practitioner

## 2022-11-01 ENCOUNTER — Ambulatory Visit: Payer: Medicaid Other | Admitting: Nurse Practitioner

## 2022-11-23 ENCOUNTER — Encounter: Payer: Self-pay | Admitting: Nurse Practitioner

## 2022-11-23 ENCOUNTER — Ambulatory Visit: Payer: Medicaid Other | Admitting: Nurse Practitioner

## 2022-11-23 VITALS — Ht 63.0 in | Wt 183.0 lb

## 2022-11-23 DIAGNOSIS — E661 Drug-induced obesity: Secondary | ICD-10-CM

## 2022-11-23 DIAGNOSIS — Z6830 Body mass index (BMI) 30.0-30.9, adult: Secondary | ICD-10-CM

## 2022-11-23 DIAGNOSIS — E66811 Obesity, class 1: Secondary | ICD-10-CM

## 2022-11-23 DIAGNOSIS — H6690 Otitis media, unspecified, unspecified ear: Secondary | ICD-10-CM

## 2022-11-23 DIAGNOSIS — J019 Acute sinusitis, unspecified: Secondary | ICD-10-CM | POA: Diagnosis not present

## 2022-11-23 DIAGNOSIS — M25562 Pain in left knee: Secondary | ICD-10-CM | POA: Diagnosis not present

## 2022-11-23 DIAGNOSIS — R635 Abnormal weight gain: Secondary | ICD-10-CM

## 2022-11-23 DIAGNOSIS — G8929 Other chronic pain: Secondary | ICD-10-CM

## 2022-11-23 MED ORDER — PHENTERMINE HCL 37.5 MG PO TABS: ORAL_TABLET | ORAL | 1 refills | Status: DC

## 2022-11-23 MED ORDER — AMOXICILLIN-POT CLAVULANATE 875-125 MG PO TABS
1.0000 | ORAL_TABLET | Freq: Two times a day (BID) | ORAL | 0 refills | Status: AC
Start: 2022-11-23 — End: 2022-12-03

## 2022-11-23 MED ORDER — IBUPROFEN 800 MG PO TABS
800.0000 mg | ORAL_TABLET | Freq: Two times a day (BID) | ORAL | 3 refills | Status: AC | PRN
Start: 2022-11-23 — End: ?

## 2022-11-23 MED ORDER — ACETAMINOPHEN-CODEINE 300-30 MG PO TABS
1.0000 | ORAL_TABLET | Freq: Every evening | ORAL | 0 refills | Status: AC | PRN
Start: 2022-11-23 — End: ?

## 2022-11-23 NOTE — Progress Notes (Signed)
[facility] [address] [place], [place] [address]  Internal MEDICINE  Telephone Visit  Patient Name: Whitney Brandt  [id number]  [id number]  Date of Service: 11/23/2022  I connected with the patient at 1555 by telephone and verified the patients identity using two identifiers.   I discussed the limitations, risks, security and privacy concerns of performing an evaluation and management service by telephone and the availability of in person appointments. I also discussed with the patient that there may be a patient responsible charge related to the service.  The patient expressed understanding and agrees to proceed.    Chief Complaint  Patient presents with   Telephone Screen    Left ear pain going on since 1 week    Telephone Assessment    [phone]   Ear Pain    Left ear     HPI Jerlean presents for a telehealth virtual visit for left ear pain x1 week Has a history of recurrent ear infection and multiple surgeries for tubes.  She reports left ear pain and pressure with a sore throat as well.  She also has chronic left knee pain and is due for refills of tylenol #3 and ibuprofen.  She has been coming in every 4-8 weeks to the office for weight loss management and takes phentermine. [name] due for refill of phentermine as well.    Current Medication: Outpatient Encounter Medications as of 11/23/2022  Medication Sig   amoxicillin-clavulanate (AUGMENTIN) 875-125 MG tablet Take 1 tablet by mouth 2 (two) times daily for 10 days. Take with food.   fluticasone (FLONASE) 50 MCG/ACT nasal spray Place 2 sprays into both nostrils daily as needed (allergies.).   hydrochlorothiazide (HYDRODIURIL) 12.5 MG tablet Take 1 tablet (12.5 mg total) by mouth daily.   HYDROcodone-acetaminophen (NORCO/VICODIN) 5-325 MG tablet Take 1 tablet by mouth at bedtime as needed for moderate pain or severe pain.   HYDROcodone-acetaminophen (NORCO/VICODIN) 5-325 MG tablet Take 1 tablet by mouth  at bedtime as needed for moderate pain or severe pain.   levocetirizine (XYZAL) 5 MG tablet Take 1 tab po day for allergic rhinitis   mupirocin ointment (BACTROBAN) 2 % Apply to affected areas twice daily as needed   nystatin (MYCOSTATIN/NYSTOP) powder Apply topically 4 (four) times daily.   nystatin ointment (MYCOSTATIN) Apply 1 Application topically 3 (three) times daily.   [DISCONTINUED] acetaminophen-codeine (TYLENOL #3) 300-30 MG tablet Take 1 tablet by mouth at bedtime as needed for moderate pain or severe pain. Take one tab po qhs for cough   [DISCONTINUED] ibuprofen (ADVIL) 800 MG tablet TAKE 1 TABLET (800 MG TOTAL) BY MOUTH 2 (TWO) TIMES DAILY AS NEEDED FOR MODERATE PAIN.   [DISCONTINUED] phentermine (ADIPEX-P) 37.5 MG tablet Take 1/2 tablet by mouth in the morning before breakfast, then take 1/2 tablet by mouth at midday.   acetaminophen-codeine (TYLENOL #3) 300-30 MG tablet Take 1 tablet by mouth at bedtime as needed for moderate pain or severe pain. Take one tab po qhs for cough   ibuprofen (ADVIL) 800 MG tablet Take 1 tablet (800 mg total) by mouth 2 (two) times daily as needed for moderate pain.   phentermine (ADIPEX-P) 37.5 MG tablet Take 1/2 tablet by mouth in the morning before breakfast, then take 1/2 tablet by mouth at midday.   No facility-administered encounter medications on file as of 11/23/2022.    Surgical History: Past Surgical History:  Procedure Laterality Date   ABDOMINAL HYSTERECTOMY     CERUMEN REMOVAL Bilateral 03/03/2022  Procedure: CERUMEN REMOVAL;  Surgeon: Linus Salmons, MD;  Location: [facility];  Service: ENT;  Laterality: Bilateral;   PANNICULECTOMY N/A 07/09/2019   Procedure: PANNICULECTOMY;  Surgeon: Allena Napoleon, MD;  Location: [facility] OR;  Service: Plastics;  Laterality: N/A;  3 hours   REMOVAL OF EAR TUBE Bilateral 03/03/2022   Procedure: REMOVAL OF EAR TUBE;  Surgeon: Linus Salmons, MD;  Location: [facility];  Service: ENT;   Laterality: Bilateral;    Medical History: Past Medical History:  Diagnosis Date   Diabetes mellitus without complication (HCC)     " she has not had diabetes in 5 years since she lost weight. " per sister, [name], [place]   GERD (gastroesophageal reflux disease)    Intellectual disability    Panniculitis    Wears glasses     Family History: Family History  Problem Relation Age of Onset   Diabetes Mother    Hypertension Mother    Thyroid disease Mother     Social History   Socioeconomic History   Marital status: Single    Spouse name: Not on file   Number of children: Not on file   Years of education: Not on file   Highest education level: Not on file  Occupational History   Not on file  Tobacco Use   Smoking status: Never   Smokeless tobacco: Never  Vaping Use   Vaping status: Never Used  Substance and Sexual Activity   Alcohol use: No   Drug use: No   Sexual activity: Never  Other Topics Concern   Not on file  Social History Narrative   Not on file   Social Determinants of Health   Financial Resource Strain: Not on file  Food Insecurity: Not on file  Transportation Needs: Not on file  Physical Activity: Not on file  Stress: Not on file  Social Connections: Not on file  Intimate Partner Violence: Not on file      Review of Systems  HENT:  Positive for ear discharge, ear pain and hearing loss.   Respiratory: Negative.  Negative for cough, chest tightness, shortness of breath and wheezing.   Cardiovascular: Negative.  Negative for chest pain and palpitations.    Vital Signs: Ht 5\' 3"  (1.6 m)   Wt 183 lb (83 kg)   BMI 32.42 kg/m    Observation/Objective: [name] alert and oriented. No acute distressed noted.     Assessment/Plan: 1. Acute otitis media, unspecified otitis media type Augmentin prescribed. Take with food if it upsets her stomach - amoxicillin-clavulanate (AUGMENTIN) 875-125 MG tablet; Take 1 tablet by mouth 2 (two) times daily for  10 days. Take with food.  Dispense: 20 tablet; Refill: 0  2. Acute non-recurrent sinusitis, unspecified location Empiric antibiotic treatment prescribed.  - amoxicillin-clavulanate (AUGMENTIN) 875-125 MG tablet; Take 1 tablet by mouth 2 (two) times daily for 10 days. Take with food.  Dispense: 20 tablet; Refill: 0  3. Chronic pain of left knee Ibuprofen and tylenol #3 refills ordered, follow up in 8 weeks  - ibuprofen (ADVIL) 800 MG tablet; Take 1 tablet (800 mg total) by mouth 2 (two) times daily as needed for moderate pain.  Dispense: 60 tablet; Refill: 3 - acetaminophen-codeine (TYLENOL #3) 300-30 MG tablet; Take 1 tablet by mouth at bedtime as needed for moderate pain or severe pain. Take one tab po qhs for cough  Dispense: 90 tablet; Refill: 0  4. Abnormal weight gain Continue phentermine as prescribed. Follow up in 8  weeks  - phentermine (ADIPEX-P) 37.5 MG tablet; Take 1/2 tablet by mouth in the morning before breakfast, then take 1/2 tablet by mouth at midday.  Dispense: 30 tablet; Refill: 1  5. 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 Follow up in 8 weeks.  - phentermine (ADIPEX-P) 37.5 MG tablet; Take 1/2 tablet by mouth in the morning before breakfast, then take 1/2 tablet by mouth at midday.  Dispense: 30 tablet; Refill: 1   General Counseling: Mateja verbalizes understanding of the findings of today's phone visit and agrees with plan of treatment. I have discussed any further diagnostic evaluation that may be needed or ordered today. We also reviewed her medications today. she has been encouraged to call the office with any questions or concerns that should arise related to todays visit.  Return if symptoms worsen or fail to improve.   No orders of the defined types were placed in this encounter.   Meds ordered this encounter  Medications   amoxicillin-clavulanate  (AUGMENTIN) 875-125 MG tablet    Sig: Take 1 tablet by mouth 2 (two) times daily for 10 days. Take with food.    Dispense:  20 tablet    Refill:  0   ibuprofen (ADVIL) 800 MG tablet    Sig: Take 1 tablet (800 mg total) by mouth 2 (two) times daily as needed for moderate pain.    Dispense:  60 tablet    Refill:  3   phentermine (ADIPEX-P) 37.5 MG tablet    Sig: Take 1/2 tablet by mouth in the morning before breakfast, then take 1/2 tablet by mouth at midday.    Dispense:  30 tablet    Refill:  1    E66.01 dx code   acetaminophen-codeine (TYLENOL #3) 300-30 MG tablet    Sig: Take 1 tablet by mouth at bedtime as needed for moderate pain or severe pain. Take one tab po qhs for cough    Dispense:  90 tablet    Refill:  0    Please refill if available, if not please fill hydrocodone script.    Time spent:10 Minutes Time spent with patient included reviewing progress notes, labs, imaging studies, and discussing plan for follow up.  [place] Controlled Substance Database was reviewed by me for overdose risk score (ORS) if appropriate.  This patient was seen by Sallyanne Kuster, FNP-C in collaboration with Dr. Beverely Risen as a part of collaborative care agreement.  Armida Vickroy R. Tedd Sias, MSN, FNP-C Internal medicine

## 2022-12-21 ENCOUNTER — Encounter: Payer: Self-pay | Admitting: Nurse Practitioner

## 2022-12-21 ENCOUNTER — Ambulatory Visit: Payer: Medicaid Other | Admitting: Nurse Practitioner

## 2022-12-21 VITALS — BP 120/84 | HR 104 | Temp 98.3°F | Resp 16 | Ht 65.0 in | Wt 189.3 lb

## 2022-12-21 DIAGNOSIS — Z23 Encounter for immunization: Secondary | ICD-10-CM | POA: Diagnosis not present

## 2022-12-21 DIAGNOSIS — E661 Drug-induced obesity: Secondary | ICD-10-CM | POA: Diagnosis not present

## 2022-12-21 DIAGNOSIS — M25562 Pain in left knee: Secondary | ICD-10-CM

## 2022-12-21 DIAGNOSIS — G8929 Other chronic pain: Secondary | ICD-10-CM | POA: Diagnosis not present

## 2022-12-21 DIAGNOSIS — Z6830 Body mass index (BMI) 30.0-30.9, adult: Secondary | ICD-10-CM

## 2022-12-21 DIAGNOSIS — R635 Abnormal weight gain: Secondary | ICD-10-CM

## 2022-12-21 MED ORDER — LISDEXAMFETAMINE DIMESYLATE 30 MG PO CAPS: 30.0000 mg | ORAL_CAPSULE | Freq: Every day | ORAL | 0 refills | Status: AC

## 2022-12-21 MED ORDER — PHENTERMINE HCL 37.5 MG PO TABS: ORAL_TABLET | ORAL | 1 refills | Status: AC

## 2022-12-21 NOTE — Progress Notes (Unsigned)
[facility] [address] [place], [place] [address]  Internal MEDICINE  Office Visit Note  Patient Name: Whitney Brandt  [id number]  [id number]  Date of Service: 12/21/2022  Chief Complaint  Patient presents with  . Follow-up    Weight loss     HPI Whitney Brandt presents for a follow-up visit for weight and chronic pain.  Weight loss -- gained 6 lbs, has been snacking in the middle of the night. Has been taking the whole phentermine tablet in the morning instead of splitting the dose between morning and afternoon. Wants to try  Chronic left knee pain -- tylenol #3 is still on backorder at her pharmacy. Will continue using hydrocodone instead as needed at bedtime.  Also needs flu shot today.    Current Medication: Outpatient Encounter Medications as of 12/21/2022  Medication Sig  . acetaminophen-codeine (TYLENOL #3) 300-30 MG tablet Take 1 tablet by mouth at bedtime as needed for moderate pain or severe pain. Take one tab po qhs for cough  . fluticasone (FLONASE) 50 MCG/ACT nasal spray Place 2 sprays into both nostrils daily as needed (allergies.).  Marland Kitchen hydrochlorothiazide (HYDRODIURIL) 12.5 MG tablet Take 1 tablet (12.5 mg total) by mouth daily.  Marland Kitchen HYDROcodone-acetaminophen (NORCO/VICODIN) 5-325 MG tablet Take 1 tablet by mouth at bedtime as needed for moderate pain or severe pain.  Marland Kitchen HYDROcodone-acetaminophen (NORCO/VICODIN) 5-325 MG tablet Take 1 tablet by mouth at bedtime as needed for moderate pain or severe pain.  Marland Kitchen ibuprofen (ADVIL) 800 MG tablet Take 1 tablet (800 mg total) by mouth 2 (two) times daily as needed for moderate pain.  Marland Kitchen levocetirizine (XYZAL) 5 MG tablet Take 1 tab po day for allergic rhinitis  . lisdexamfetamine (VYVANSE) 30 MG capsule Take 1 capsule (30 mg total) by mouth daily.  . mupirocin ointment (BACTROBAN) 2 % Apply to affected areas twice daily as needed  . nystatin (MYCOSTATIN/NYSTOP) powder Apply topically 4 (four) times daily.  Marland Kitchen nystatin  ointment (MYCOSTATIN) Apply 1 Application topically 3 (three) times daily.  . [DISCONTINUED] phentermine (ADIPEX-P) 37.5 MG tablet Take 1/2 tablet by mouth in the morning before breakfast, then take 1/2 tablet by mouth at midday.  . phentermine (ADIPEX-P) 37.5 MG tablet Take 1/2 tablet by mouth in the morning before breakfast, then take 1/2 tablet by mouth at midday.   No facility-administered encounter medications on file as of 12/21/2022.    Surgical History: Past Surgical History:  Procedure Laterality Date  . ABDOMINAL HYSTERECTOMY    . CERUMEN REMOVAL Bilateral 03/03/2022   Procedure: CERUMEN REMOVAL;  Surgeon: Linus Salmons, MD;  Location: [facility];  Service: ENT;  Laterality: Bilateral;  . PANNICULECTOMY N/A 07/09/2019   Procedure: PANNICULECTOMY;  Surgeon: Allena Napoleon, MD;  Location: [facility] OR;  Service: Plastics;  Laterality: N/A;  3 hours  . REMOVAL OF EAR TUBE Bilateral 03/03/2022   Procedure: REMOVAL OF EAR TUBE;  Surgeon: Linus Salmons, MD;  Location: [facility];  Service: ENT;  Laterality: Bilateral;    Medical History: Past Medical History:  Diagnosis Date  . Diabetes mellitus without complication (HCC)     " she has not had diabetes in 5 years since she lost weight. " per sister, Whitney Brandt, [place]  . GERD (gastroesophageal reflux disease)   . Intellectual disability   . Panniculitis   . Wears glasses     Family History: Family History  Problem Relation Age of Onset  . Diabetes Mother   . Hypertension Mother   .  Thyroid disease Mother     Social History   Socioeconomic History  . Marital status: Single    Spouse name: Not on file  . Number of children: Not on file  . Years of education: Not on file  . Highest education level: Not on file  Occupational History  . Not on file  Tobacco Use  . Smoking status: Never  . Smokeless tobacco: Never  Vaping Use  . Vaping status: Never Used  Substance and Sexual Activity  . Alcohol use: No  .  Drug use: No  . Sexual activity: Never  Other Topics Concern  . Not on file  Social History Narrative  . Not on file   Social Determinants of Health   Financial Resource Strain: Not on file  Food Insecurity: Not on file  Transportation Needs: Not on file  Physical Activity: Not on file  Stress: Not on file  Social Connections: Not on file  Intimate Partner Violence: Not on file      Review of Systems  Vital Signs: BP 120/84   Pulse (!) 104   Temp 98.3 F (36.8 C)   Resp 16   Ht 5\' 5"  (1.651 m)   Wt 189 lb 4.8 oz (85.9 kg)   SpO2 93%   BMI 31.50 kg/m    Physical Exam     Assessment/Plan:   General Counseling: Whitney Brandt verbalizes understanding of the findings of todays visit and agrees with plan of treatment. I have discussed any further diagnostic evaluation that may be needed or ordered today. We also reviewed her medications today. she has been encouraged to call the office with any questions or concerns that should arise related to todays visit.    Orders Placed This Encounter  Procedures  . Influenza, MDCK, trivalent, PF(Flucelvax egg-free)    Meds ordered this encounter  Medications  . lisdexamfetamine (VYVANSE) 30 MG capsule    Sig: Take 1 capsule (30 mg total) by mouth daily.    Dispense:  30 capsule    Refill:  0    Fill new script, send prior authorization if required.  . phentermine (ADIPEX-P) 37.5 MG tablet    Sig: Take 1/2 tablet by mouth in the morning before breakfast, then take 1/2 tablet by mouth at midday.    Dispense:  30 tablet    Refill:  1    E66.01 dx code, patient to continue phentermine while waiting for vyvanse to be approved.    Return in about 8 weeks (around 02/15/2023) for F/U, Weight loss, Whitney Brandt PCP.   Total time spent:*** Minutes Time spent includes review of chart, medications, test results, and follow up plan with the patient.   [place] Controlled Substance Database was reviewed by me.  This patient was seen by  Sallyanne Kuster, FNP-C in collaboration with Dr. Beverely Risen as a part of collaborative care agreement.   Laquonda Welby R. Tedd Sias, MSN, FNP-C Internal medicine

## 2023-02-15 ENCOUNTER — Ambulatory Visit: Payer: Medicaid Other | Admitting: Nurse Practitioner

## 2023-02-15 ENCOUNTER — Encounter: Payer: Self-pay | Admitting: Nurse Practitioner

## 2023-02-23 ENCOUNTER — Ambulatory Visit: Payer: Medicaid Other | Admitting: Nurse Practitioner

## 2023-05-04 ENCOUNTER — Other Ambulatory Visit: Payer: Self-pay | Admitting: Nurse Practitioner

## 2023-05-04 DIAGNOSIS — Z79899 Other long term (current) drug therapy: Secondary | ICD-10-CM

## 2023-12-21 ENCOUNTER — Ambulatory Visit: Admitting: Nurse Practitioner

## 2024-01-16 ENCOUNTER — Ambulatory Visit: Admitting: Nurse Practitioner
